# Patient Record
Sex: Female | Born: 1970
Health system: Southern US, Community
[De-identification: ages and names within clinical notes are randomized; demographics above are authoritative.]

## PROBLEM LIST (undated history)

## (undated) DIAGNOSIS — G4730 Sleep apnea, unspecified: Secondary | ICD-10-CM

## (undated) DIAGNOSIS — I10 Essential (primary) hypertension: Secondary | ICD-10-CM

## (undated) DIAGNOSIS — D649 Anemia, unspecified: Secondary | ICD-10-CM

## (undated) DIAGNOSIS — D219 Benign neoplasm of connective and other soft tissue, unspecified: Secondary | ICD-10-CM

## (undated) DIAGNOSIS — E7800 Pure hypercholesterolemia, unspecified: Secondary | ICD-10-CM

## (undated) HISTORY — DX: Pure hypercholesterolemia, unspecified: E78.00

## (undated) HISTORY — DX: Benign neoplasm of connective and other soft tissue, unspecified: D21.9

## (undated) HISTORY — DX: Essential (primary) hypertension: I10

## (undated) HISTORY — PX: BREAST SURGERY: SHX581

## (undated) HISTORY — PX: TUBAL LIGATION: SHX77

---

## 2015-09-21 LAB — BASIC METABOLIC PANEL
BUN: 7 mg/dL (ref 4–21)
CREATININE: 0.9 mg/dL (ref 0.5–1.1)
Glucose: 83 mg/dL
POTASSIUM: 4.4 mmol/L (ref 3.4–5.3)
Sodium: 143 mmol/L (ref 137–147)

## 2015-09-21 LAB — HEPATIC FUNCTION PANEL
ALT: 11 U/L (ref 7–35)
AST: 14 U/L (ref 13–35)
Bilirubin, Total: 0.5 mg/dL

## 2015-10-07 LAB — HM MAMMOGRAPHY

## 2015-10-13 LAB — HM PAP SMEAR: HM Pap smear: ABNORMAL

## 2015-10-14 LAB — HM MAMMOGRAPHY: HM MAMMO: ABNORMAL — AB (ref 0–4)

## 2016-04-26 ENCOUNTER — Ambulatory Visit (INDEPENDENT_AMBULATORY_CARE_PROVIDER_SITE_OTHER): Payer: PRIVATE HEALTH INSURANCE | Admitting: Internal Medicine

## 2016-04-26 ENCOUNTER — Other Ambulatory Visit (INDEPENDENT_AMBULATORY_CARE_PROVIDER_SITE_OTHER): Payer: PRIVATE HEALTH INSURANCE

## 2016-04-26 VITALS — BP 150/90 | HR 64 | Temp 98.6°F | Resp 12 | Ht 66.5 in | Wt 160.0 lb

## 2016-04-26 DIAGNOSIS — E785 Hyperlipidemia, unspecified: Secondary | ICD-10-CM

## 2016-04-26 DIAGNOSIS — F5101 Primary insomnia: Secondary | ICD-10-CM

## 2016-04-26 DIAGNOSIS — Z720 Tobacco use: Secondary | ICD-10-CM | POA: Diagnosis not present

## 2016-04-26 DIAGNOSIS — I10 Essential (primary) hypertension: Secondary | ICD-10-CM

## 2016-04-26 DIAGNOSIS — R87619 Unspecified abnormal cytological findings in specimens from cervix uteri: Secondary | ICD-10-CM

## 2016-04-26 DIAGNOSIS — J301 Allergic rhinitis due to pollen: Secondary | ICD-10-CM

## 2016-04-26 LAB — COMPREHENSIVE METABOLIC PANEL
ALBUMIN: 4.2 g/dL (ref 3.5–5.2)
ALT: 7 U/L (ref 0–35)
AST: 13 U/L (ref 0–37)
Alkaline Phosphatase: 54 U/L (ref 39–117)
BILIRUBIN TOTAL: 0.6 mg/dL (ref 0.2–1.2)
BUN: 9 mg/dL (ref 6–23)
CALCIUM: 9.5 mg/dL (ref 8.4–10.5)
CHLORIDE: 106 meq/L (ref 96–112)
CO2: 27 meq/L (ref 19–32)
CREATININE: 0.9 mg/dL (ref 0.40–1.20)
GFR: 71.45 mL/min (ref >60.00)
Glucose, Bld: 88 mg/dL (ref 70–99)
Potassium: 4.3 mEq/L (ref 3.5–5.1)
SODIUM: 139 meq/L (ref 135–145)
Total Protein: 7.8 g/dL (ref 6.0–8.3)

## 2016-04-26 LAB — URINALYSIS, ROUTINE W REFLEX MICROSCOPIC
Bilirubin Urine: NEGATIVE
Ketones, ur: NEGATIVE
Leukocytes, UA: NEGATIVE
Nitrite: NEGATIVE
PH: 5.5 (ref 5.0–8.0)
SPECIFIC GRAVITY, URINE: 1.02 (ref 1.000–1.030)
TOTAL PROTEIN, URINE-UPE24: NEGATIVE
URINE GLUCOSE: NEGATIVE
UROBILINOGEN UA: 0.2 (ref 0.0–1.0)

## 2016-04-26 LAB — LIPID PANEL
CHOL/HDL RATIO: 4
CHOLESTEROL: 186 mg/dL (ref 0–200)
HDL: 41.7 mg/dL (ref >39.00)
LDL Cholesterol: 130 mg/dL — ABNORMAL HIGH (ref 0–99)
NonHDL: 143.82
TRIGLYCERIDES: 71 mg/dL (ref 0.0–149.0)
VLDL: 14.2 mg/dL (ref 0.0–40.0)

## 2016-04-26 LAB — CBC WITH DIFFERENTIAL/PLATELET
BASOS ABS: 0 10*3/uL (ref 0.0–0.1)
BASOS PCT: 0.7 % (ref 0.0–3.0)
EOS ABS: 0.1 10*3/uL (ref 0.0–0.7)
Eosinophils Relative: 2 % (ref 0.0–5.0)
HEMATOCRIT: 35.8 % — AB (ref 36.0–46.0)
Hemoglobin: 11.9 g/dL — ABNORMAL LOW (ref 12.0–15.0)
LYMPHS PCT: 44.4 % (ref 12.0–46.0)
Lymphs Abs: 2.6 10*3/uL (ref 0.7–4.0)
MCHC: 33.3 g/dL (ref 30.0–36.0)
MCV: 87.6 fl (ref 78.0–100.0)
MONO ABS: 0.5 10*3/uL (ref 0.1–1.0)
Monocytes Relative: 8.2 % (ref 3.0–12.0)
NEUTROS ABS: 2.6 10*3/uL (ref 1.4–7.7)
NEUTROS PCT: 44.7 % (ref 43.0–77.0)
PLATELETS: 296 10*3/uL (ref 150.0–400.0)
RBC: 4.09 Mil/uL (ref 3.87–5.11)
RDW: 16.4 % — AB (ref 11.5–15.5)
WBC: 5.9 10*3/uL (ref 4.0–10.5)

## 2016-04-26 LAB — TSH: TSH: 0.88 u[IU]/mL (ref 0.35–4.50)

## 2016-04-26 MED ORDER — AZELASTINE-FLUTICASONE 137-50 MCG/ACT NA SUSP: 2.0000 | Freq: Two times a day (BID) | NASAL | 11 refills | Status: DC

## 2016-04-26 MED ORDER — CHLORTHALIDONE 25 MG PO TABS: 25.0000 mg | ORAL_TABLET | Freq: Every day | ORAL | 1 refills | Status: DC

## 2016-04-26 MED ORDER — VARENICLINE TARTRATE 0.5 MG X 11 & 1 MG X 42 PO MISC: ORAL | 0 refills | Status: DC

## 2016-04-26 MED ORDER — VARENICLINE TARTRATE 1 MG PO TABS: 1.0000 mg | ORAL_TABLET | Freq: Two times a day (BID) | ORAL | 3 refills | Status: DC

## 2016-04-26 NOTE — Patient Instructions (Signed)
Hypertension Hypertension, commonly called high blood pressure, is when the force of blood pumping through your arteries is too strong. Your arteries are the blood vessels that carry blood from your heart throughout your body. A blood pressure reading consists of a higher number over a lower number, such as 110/72. The higher number (systolic) is the pressure inside your arteries when your heart pumps. The lower number (diastolic) is the pressure inside your arteries when your heart relaxes. Ideally you want your blood pressure below 120/80. Hypertension forces your heart to work harder to pump blood. Your arteries may become narrow or stiff. Having untreated or uncontrolled hypertension can cause heart attack, stroke, kidney disease, and other problems. RISK FACTORS Some risk factors for high blood pressure are controllable. Others are not.  Risk factors you cannot control include:   Race. You may be at higher risk if you are African American.  Age. Risk increases with age.  Gender. Men are at higher risk than women before age 45 years. After age 65, women are at higher risk than men. Risk factors you can control include:  Not getting enough exercise or physical activity.  Being overweight.  Getting too much fat, sugar, calories, or salt in your diet.  Drinking too much alcohol. SIGNS AND SYMPTOMS Hypertension does not usually cause signs or symptoms. Extremely high blood pressure (hypertensive crisis) may cause headache, anxiety, shortness of breath, and nosebleed. DIAGNOSIS To check if you have hypertension, your health care provider will measure your blood pressure while you are seated, with your arm held at the level of your heart. It should be measured at least twice using the same arm. Certain conditions can cause a difference in blood pressure between your right and left arms. A blood pressure reading that is higher than normal on one occasion does not mean that you need treatment. If  it is not clear whether you have high blood pressure, you may be asked to return on a different day to have your blood pressure checked again. Or, you may be asked to monitor your blood pressure at home for 1 or more weeks. TREATMENT Treating high blood pressure includes making lifestyle changes and possibly taking medicine. Living a healthy lifestyle can help lower high blood pressure. You may need to change some of your habits. Lifestyle changes may include:  Following the DASH diet. This diet is high in fruits, vegetables, and whole grains. It is low in salt, red meat, and added sugars.  Keep your sodium intake below 2,300 mg per day.  Getting at least 30-45 minutes of aerobic exercise at least 4 times per week.  Losing weight if necessary.  Not smoking.  Limiting alcoholic beverages.  Learning ways to reduce stress. Your health care provider may prescribe medicine if lifestyle changes are not enough to get your blood pressure under control, and if one of the following is true:  You are 18-59 years of age and your systolic blood pressure is above 140.  You are 60 years of age or older, and your systolic blood pressure is above 150.  Your diastolic blood pressure is above 90.  You have diabetes, and your systolic blood pressure is over 140 or your diastolic blood pressure is over 90.  You have kidney disease and your blood pressure is above 140/90.  You have heart disease and your blood pressure is above 140/90. Your personal target blood pressure may vary depending on your medical conditions, your age, and other factors. HOME CARE INSTRUCTIONS    Have your blood pressure rechecked as directed by your health care provider.   Take medicines only as directed by your health care provider. Follow the directions carefully. Blood pressure medicines must be taken as prescribed. The medicine does not work as well when you skip doses. Skipping doses also puts you at risk for  problems.  Do not smoke.   Monitor your blood pressure at home as directed by your health care provider. SEEK MEDICAL CARE IF:   You think you are having a reaction to medicines taken.  You have recurrent headaches or feel dizzy.  You have swelling in your ankles.  You have trouble with your vision. SEEK IMMEDIATE MEDICAL CARE IF:  You develop a severe headache or confusion.  You have unusual weakness, numbness, or feel faint.  You have severe chest or abdominal pain.  You vomit repeatedly.  You have trouble breathing. MAKE SURE YOU:   Understand these instructions.  Will watch your condition.  Will get help right away if you are not doing well or get worse.   This information is not intended to replace advice given to you by your health care provider. Make sure you discuss any questions you have with your health care provider.   Document Released: 06/18/2005 Document Revised: 11/02/2014 Document Reviewed: 04/10/2013 Elsevier Interactive Patient Education 2016 [employer]  

## 2016-04-26 NOTE — Progress Notes (Signed)
Pre visit review using our clinic review tool, if applicable. No additional management support is needed unless otherwise documented below in the visit note. 

## 2016-04-26 NOTE — Progress Notes (Signed)
Subjective:  Patient ID: Samantha Reed, female    DOB: 1970/12/16  Age: 45 y.o. MRN: [id number]  CC: Hypertension; Hyperlipidemia; and Allergic Rhinitis   NEW TO ME  HPI Tatym Satterwhite presents for a BP check - She has a history of high blood pressure but ran out of her diuretic a few weeks ago so she was she has been taking it sparingly. She does complain that she's had a few headaches and tells me about a week ago her blood pressure was about 190/100. She denies blurred vision, chest pain, shortness of breath, palpitations, edema, fatigue.  She wants to quit smoking.  She also complains of worsening allergies with postnasal drip, nasal congestion, and runny nose.  No outpatient prescriptions prior to visit.   No facility-administered medications prior to visit.     ROS Review of Systems  Constitutional: Negative.  Negative for activity change, appetite change, chills, fatigue, fever and unexpected weight change.  HENT: Positive for congestion, postnasal drip and rhinorrhea. Negative for nosebleeds, sinus pressure, sneezing, sore throat, tinnitus and trouble swallowing.   Eyes: Negative.  Negative for visual disturbance.  Respiratory: Negative.  Negative for apnea, cough, choking, chest tightness, shortness of breath and stridor.   Cardiovascular: Negative.  Negative for chest pain, palpitations and leg swelling.  Gastrointestinal: Negative.  Negative for abdominal pain, constipation, diarrhea and nausea.  Endocrine: Negative.   Genitourinary: Negative.  Negative for difficulty urinating, dysuria, frequency, hematuria and urgency.  Musculoskeletal: Negative.  Negative for arthralgias, back pain, myalgias and neck pain.  Skin: Negative.   Allergic/Immunologic: Negative.   Neurological: Positive for headaches. Negative for dizziness, tremors, syncope, weakness, light-headedness and numbness.  Hematological: Negative.  Negative for adenopathy. Does not bruise/bleed easily.    Psychiatric/Behavioral: Negative.  Negative for sleep disturbance. The patient is not nervous/anxious.     Objective:  BP (!) 150/90 (BP Location: Left Arm, Patient Position: Sitting, Cuff Size: Normal)   Pulse 64   Temp 98.6 F (37 C) (Oral)   Resp 12   Ht 5' 6.5" (1.689 m)   Wt 160 lb (72.6 kg)   LMP 04/01/2016 (Exact Date) Comment: s/p BTL  SpO2 98%   BMI 25.44 kg/m   BP Readings from Last 3 Encounters:  04/26/16 (!) 150/90    Wt Readings from Last 3 Encounters:  04/26/16 160 lb (72.6 kg)    Physical Exam  Constitutional: She is oriented to person, place, and time. She appears well-developed and well-nourished. No distress.  HENT:  Head: Normocephalic and atraumatic.  Mouth/Throat: Oropharynx is clear and moist. No oropharyngeal exudate.  Eyes: Conjunctivae are normal. Right eye exhibits no discharge. Left eye exhibits no discharge. No scleral icterus.  Neck: Normal range of motion. Neck supple. No JVD present. No tracheal deviation present. No thyromegaly present.  Cardiovascular: Normal rate, regular rhythm, normal heart sounds and intact distal pulses.  Exam reveals no gallop and no friction rub.   No murmur heard. Pulmonary/Chest: Effort normal and breath sounds normal. No stridor. No respiratory distress. She has no wheezes. She has no rales. She exhibits no tenderness.  Abdominal: Soft. Bowel sounds are normal. She exhibits no distension and no mass. There is no tenderness. There is no rebound and no guarding.  Musculoskeletal: Normal range of motion. She exhibits no edema, tenderness or deformity.  Lymphadenopathy:    She has no cervical adenopathy.  Neurological: She is oriented to person, place, and time.  Skin: Skin is warm and dry. No rash noted. She  is not diaphoretic. No erythema. No pallor.  Psychiatric: She has a normal mood and affect. Her behavior is normal. Judgment and thought content normal.  Vitals reviewed.   Lab Results  Component Value Date    WBC 5.9 04/26/2016   HGB 11.9 (L) 04/26/2016   HCT 35.8 (L) 04/26/2016   PLT 296.0 04/26/2016   GLUCOSE 88 04/26/2016   CHOL 186 04/26/2016   TRIG 71.0 04/26/2016   HDL 41.70 04/26/2016   LDLCALC 130 (H) 04/26/2016   ALT 7 04/26/2016   AST 13 04/26/2016   NA 139 04/26/2016   K 4.3 04/26/2016   CL 106 04/26/2016   CREATININE 0.90 04/26/2016   BUN 9 04/26/2016   CO2 27 04/26/2016   TSH 0.88 04/26/2016    Patient was never admitted.  Assessment & Plan:   Robbyn was seen today for hypertension, hyperlipidemia and allergic rhinitis .  Diagnoses and all orders for this visit:  Essential hypertension, benign- Her blood pressure is not adequately well controlled and she has had a few headaches recently, her labs are negative for any evidence of secondary causes or end organ damage, I have asked her to change to a more potent thiazide diuretic. -     chlorthalidone (HYGROTON) 25 MG tablet; Take 1 tablet (25 mg total) by mouth daily. -     Comprehensive metabolic panel; Future -     CBC with Differential/Platelet; Future -     TSH; Future -     Urinalysis, Routine w reflex microscopic (not at [facility]); Future  Hyperlipidemia LDL goal <130- her Framingham risk score is very slightly elevated at 8% but she tells me that she will quit smoking soon at which time the Framingham risk score will go down quite a bit so at this time I do not recommend that she restart a statin. However, if in the long run she is not able to quit smoking then I may reconsider this. -     Lipid panel; Future -     TSH; Future  Primary insomnia  Tobacco abuse disorder- she will use Chantix in addition to behavior modification and nicotine patches. -     varenicline (CHANTIX CONTINUING MONTH PAK) 1 MG tablet; Take 1 tablet (1 mg total) by mouth 2 (two) times daily. -     varenicline (CHANTIX STARTING MONTH PAK) 0.5 MG X 11 & 1 MG X 42 tablet; Take one 0.5 mg tablet by mouth once daily for 3 days, then increase to  one 0.5 mg tablet twice daily for 4 days, then increase to one 1 mg tablet twice daily.  Acute seasonal allergic rhinitis due to pollen- she will start treating this with dye missed a period -     Azelastine-Fluticasone (DYMISTA) 137-50 MCG/ACT SUSP; Place 2 puffs into the nose 2 (two) times daily.  Abnormal cervical Papanicolaou smear, unspecified abnormal pap finding- she had an abnormal Pap smear in April of this year, I do not have any records and she does not know the specifics, she needs to set up with a gynecologist [place] made the referral. -     Ambulatory referral to Gynecology   I have discontinued Ms. Tall's hydrochlorothiazide. I am also having her start on chlorthalidone, varenicline, varenicline, and Azelastine-Fluticasone. Additionally, I am having her maintain her zolpidem and simvastatin.  Meds ordered this encounter  Medications  . zolpidem (AMBIEN) 10 MG tablet    Sig: Take 10 mg by mouth at bedtime as needed  for sleep.  . simvastatin (ZOCOR) 20 MG tablet    Sig: Take 20 mg by mouth daily.  Marland Kitchen DISCONTD: hydrochlorothiazide (HYDRODIURIL) 12.5 MG tablet    Sig: Take 12.5 mg by mouth daily.  . chlorthalidone (HYGROTON) 25 MG tablet    Sig: Take 1 tablet (25 mg total) by mouth daily.    Dispense:  90 tablet    Refill:  1  . varenicline (CHANTIX CONTINUING MONTH PAK) 1 MG tablet    Sig: Take 1 tablet (1 mg total) by mouth 2 (two) times daily.    Dispense:  60 tablet    Refill:  3  . varenicline (CHANTIX STARTING MONTH PAK) 0.5 MG X 11 & 1 MG X 42 tablet    Sig: Take one 0.5 mg tablet by mouth once daily for 3 days, then increase to one 0.5 mg tablet twice daily for 4 days, then increase to one 1 mg tablet twice daily.    Dispense:  53 tablet    Refill:  0  . Azelastine-Fluticasone (DYMISTA) 137-50 MCG/ACT SUSP    Sig: Place 2 puffs into the nose 2 (two) times daily.    Dispense:  23 g    Refill:  11     Follow-up: Return in about 3 months  (around 07/27/2016).  Scarlette Calico, MD

## 2016-04-27 ENCOUNTER — Encounter: Payer: Self-pay | Admitting: Internal Medicine

## 2016-04-30 ENCOUNTER — Encounter: Payer: Self-pay | Admitting: Internal Medicine

## 2016-05-23 ENCOUNTER — Telehealth: Payer: Self-pay | Admitting: Internal Medicine

## 2016-05-23 NOTE — Telephone Encounter (Signed)
Rec'd from Dr. Huntley Estelle forward 38 pages to Dr. Ronnald Ramp

## 2016-05-30 ENCOUNTER — Ambulatory Visit (INDEPENDENT_AMBULATORY_CARE_PROVIDER_SITE_OTHER): Payer: 59 | Admitting: Gynecology

## 2016-05-30 ENCOUNTER — Encounter: Payer: Self-pay | Admitting: Gynecology

## 2016-05-30 VITALS — BP 136/88 | Ht 66.5 in | Wt 159.8 lb

## 2016-05-30 DIAGNOSIS — Z113 Encounter for screening for infections with a predominantly sexual mode of transmission: Secondary | ICD-10-CM

## 2016-05-30 DIAGNOSIS — Z01411 Encounter for gynecological examination (general) (routine) with abnormal findings: Secondary | ICD-10-CM | POA: Diagnosis not present

## 2016-05-30 DIAGNOSIS — F17200 Nicotine dependence, unspecified, uncomplicated: Secondary | ICD-10-CM | POA: Diagnosis not present

## 2016-05-30 DIAGNOSIS — D259 Leiomyoma of uterus, unspecified: Secondary | ICD-10-CM | POA: Diagnosis not present

## 2016-05-30 DIAGNOSIS — N938 Other specified abnormal uterine and vaginal bleeding: Secondary | ICD-10-CM | POA: Diagnosis not present

## 2016-05-30 DIAGNOSIS — N946 Dysmenorrhea, unspecified: Secondary | ICD-10-CM | POA: Diagnosis not present

## 2016-05-30 LAB — HM PAP SMEAR

## 2016-05-30 NOTE — Addendum Note (Signed)
Addended by: Burnett Kanaris on: 05/30/2016 10:34 AM   Modules accepted: Orders

## 2016-05-30 NOTE — Progress Notes (Signed)
Samantha Reed 11/30/1970 [id number]   History:    45 y.o.  for annual gyn exam who is a new patient to the practice. Patient has moved from [place] and has stated that she has had history of fibroid uterus in the past as well as abnormal Pap smears but did not remember the specifics. She believes she had ultrasound and Pap smear within the past year but we have no records. Patient recently separated from her husband would like to have a full STD screen today although she is not reporting any vaginal discharge. Several years ago she did have a tubal ligation. She reports normal menstrual cycles with the exception that they're very heavy and sometimes with a lot of cramping but she has had in the past 2 cycles per month but reports a last 2 months her cycles of been regular. Patient with past lumpectomy benign does not remember which breast.  Past medical history,surgical history, family history and social history were all reviewed and documented in the EPIC chart.  Gynecologic History Patient's last menstrual period was 04/01/2016. Contraception: tubal ligation Last Pap: Over year ago. Results were: abnormal Last mammogram: 2017. Results were: We have no report  Obstetric History OB History  Gravida Para Term Preterm AB Living  5 2     3 2   SAB TAB Ectopic Multiple Live Births               # Outcome Date GA Lbr Len/2nd Weight Sex Delivery Anes PTL Lv  5 AB           4 AB           3 AB           2 Para           1 Para                ROS: A ROS was performed and pertinent positives and negatives are included in the history.  GENERAL: No fevers or chills. HEENT: No change in vision, no earache, sore throat or sinus congestion. NECK: No pain or stiffness. CARDIOVASCULAR: No chest pain or pressure. No palpitations. PULMONARY: No shortness of breath, cough or wheeze. GASTROINTESTINAL: No abdominal pain, nausea, vomiting or diarrhea, melena or bright red blood per rectum.  GENITOURINARY: No urinary frequency, urgency, hesitancy or dysuria. MUSCULOSKELETAL: No joint or muscle pain, no back pain, no recent trauma. DERMATOLOGIC: No rash, no itching, no lesions. ENDOCRINE: No polyuria, polydipsia, no heat or cold intolerance. No recent change in weight. HEMATOLOGICAL: No anemia or easy bruising or bleeding. NEUROLOGIC: No headache, seizures, numbness, tingling or weakness. PSYCHIATRIC: No depression, no loss of interest in normal activity or change in sleep pattern.     Exam: chaperone present  BP 136/88   Ht 5' 6.5" (1.689 m)   Wt 159 lb 12.8 oz (72.5 kg)   LMP 04/01/2016   BMI 25.41 kg/m   Body mass index is 25.41 kg/m.  General appearance : Well developed well nourished female. No acute distress HEENT: Eyes: no retinal hemorrhage or exudates,  Neck supple, trachea midline, no carotid bruits, no thyroidmegaly Lungs: Clear to auscultation, no rhonchi or wheezes, or rib retractions  Heart: Regular rate and rhythm, no murmurs or gallops Breast:Examined in sitting and supine position were symmetrical in appearance, no palpable masses or tenderness,  no skin retraction, no nipple inversion, no nipple discharge, no skin discoloration, no axillary or supraclavicular lymphadenopathy Abdomen: no palpable masses or tenderness,  no rebound or guarding Extremities: no edema or skin discoloration or tenderness  Pelvic:  Bartholin, Urethra, Skene Glands: Within normal limits             Vagina: No gross lesions or discharge  Cervix: No gross lesions or discharge  Uterus  12-14 week size irregular firm nontender  Adnexa difficult to evaluate due to size of uterus  Anus and perineum  normal   Rectovaginal  normal sphincter tone without palpated masses or tenderness             Hemoccult not indicated     Assessment/Plan:  45 y.o. female for annual exam with history of fibroid uterus we have no records on exam today uterus was irregular measuring 12-14 week size.  Patient will be scheduled for sonohysterogram in 2 weeks to rule out any intracavitary fibroids contributing to her past history of 2 menstrual cycles per month. We'll also determine at that point to do an endometrial biopsy. Patient is a smoker and recently started Chantix. She had been smoking 1 pack cigarette per day. The STD screening that was ordered today are as follows: GC and Chlamydia culture, HIV, RPR, hepatitis B and C as well as Pap smear with HPV screening. We'll request for her medical records to review and have available when she returns back in 2 weeks.   Terrance Mass MD, 9:28 AM 05/30/2016

## 2016-05-30 NOTE — Patient Instructions (Signed)

## 2016-05-30 NOTE — Addendum Note (Signed)
Addended by: Joaquin Music on: 05/30/2016 11:46 AM   Modules accepted: Orders

## 2016-05-31 ENCOUNTER — Other Ambulatory Visit: Payer: Self-pay | Admitting: Gynecology

## 2016-05-31 DIAGNOSIS — N939 Abnormal uterine and vaginal bleeding, unspecified: Secondary | ICD-10-CM

## 2016-05-31 LAB — RPR

## 2016-05-31 LAB — HIV ANTIBODY (ROUTINE TESTING W REFLEX): HIV: NONREACTIVE

## 2016-05-31 LAB — HEPATITIS C ANTIBODY: HCV AB: NEGATIVE

## 2016-05-31 LAB — HEPATITIS B SURFACE ANTIGEN: HEP B S AG: NEGATIVE

## 2016-05-31 NOTE — Addendum Note (Signed)
Addended by: Joaquin Music on: 05/31/2016 08:39 AM   Modules accepted: Orders

## 2016-06-01 ENCOUNTER — Telehealth: Payer: Self-pay | Admitting: *Deleted

## 2016-06-01 LAB — PAP IG, CT-NG NAA, HPV HIGH-RISK
Chlamydia Probe Amp: NOT DETECTED
GC Probe Amp: NOT DETECTED
HPV DNA HIGH RISK: NOT DETECTED

## 2016-06-01 NOTE — Telephone Encounter (Signed)
Per Vince at [facility] call ref [phone] [facility] covered at 80% after deductible of $1500 met. Only $390.27 met therefore pt responsible for $1168.29. Pt will be informed KW CMA

## 2016-06-07 NOTE — Telephone Encounter (Signed)
Contacted pt and informed that we have the OV notes and will send them to Dr. Toney Rakes and mail her a copy as well.

## 2016-06-07 NOTE — Telephone Encounter (Signed)
Pt called and is requesting a copy of her records.  Have you seen them?

## 2016-06-08 NOTE — Telephone Encounter (Signed)
Records mailed to pt, sent to scan, and faxed to Dr. Sandrea Hughs office.

## 2016-06-14 ENCOUNTER — Ambulatory Visit: Payer: 59 | Admitting: Gynecology

## 2016-06-14 ENCOUNTER — Other Ambulatory Visit: Payer: 59

## 2016-06-22 ENCOUNTER — Encounter: Payer: Self-pay | Admitting: Internal Medicine

## 2016-06-22 NOTE — Progress Notes (Signed)
Abstracted result and sent to scan  

## 2016-08-07 ENCOUNTER — Ambulatory Visit (INDEPENDENT_AMBULATORY_CARE_PROVIDER_SITE_OTHER): Payer: 59 | Admitting: Women's Health

## 2016-08-07 ENCOUNTER — Telehealth: Payer: Self-pay | Admitting: Internal Medicine

## 2016-08-07 ENCOUNTER — Encounter: Payer: Self-pay | Admitting: Women's Health

## 2016-08-07 VITALS — BP 124/80

## 2016-08-07 DIAGNOSIS — Z720 Tobacco use: Secondary | ICD-10-CM | POA: Diagnosis not present

## 2016-08-07 DIAGNOSIS — N760 Acute vaginitis: Secondary | ICD-10-CM | POA: Diagnosis not present

## 2016-08-07 DIAGNOSIS — B9689 Other specified bacterial agents as the cause of diseases classified elsewhere: Secondary | ICD-10-CM

## 2016-08-07 DIAGNOSIS — F5101 Primary insomnia: Secondary | ICD-10-CM | POA: Diagnosis not present

## 2016-08-07 DIAGNOSIS — N898 Other specified noninflammatory disorders of vagina: Secondary | ICD-10-CM

## 2016-08-07 LAB — WET PREP FOR TRICH, YEAST, CLUE
TRICH WET PREP: NONE SEEN
YEAST WET PREP: NONE SEEN

## 2016-08-07 MED ORDER — ZOLPIDEM TARTRATE 10 MG PO TABS: 10.0000 mg | ORAL_TABLET | Freq: Every evening | ORAL | 2 refills | Status: DC | PRN

## 2016-08-07 MED ORDER — METRONIDAZOLE 500 MG PO TABS: 500.0000 mg | ORAL_TABLET | Freq: Two times a day (BID) | ORAL | 0 refills | Status: DC

## 2016-08-07 NOTE — Telephone Encounter (Signed)
PLEASE NOTE: All timestamps contained within this report are represented as [place] Standard Time. CONFIDENTIALTY NOTICE: This fax transmission is intended only for the addressee. It contains information that is legally privileged, confidential or otherwise protected from use or disclosure. If you are not the intended recipient, you are strictly prohibited from reviewing, disclosing, copying using or disseminating any of this information or taking any action in reliance on or regarding this information. If you have received this fax in error, please notify us immediately by telephone so that we can arrange for its return to [place]. Phone: [phone], Toll-Free: [phone], Fax: [phone] Page: 1 of 1 Call Id: [id number] [place] [facility] - Client [place] Patient Name: Samantha Reed DOB: 10/06/1970 Initial Comment Caller says, she thinks she has a sinus infection, 3-4 days, cough makes her chest hurt. She wants to make an appt. Nurse Assessment Nurse: Andria Frames, RN, Aeriel Date/Time (Eastern Time): 08/07/2016 1:42:34 PM Confirm and document reason for call. If symptomatic, describe symptoms. ---Caller states, she thinks she has a sinus infection. She thinks the worse is gone. She has pain in her chest when she coughs. It is only when she is coughing. Caller states, no fever, does have a lot of drainage. Does the patient have any new or worsening symptoms? ---Yes Will a triage be completed? ---Yes Related visit to physician within the last 2 weeks? ---No Does the PT have any chronic conditions? (i.e. diabetes, asthma, etc.) ---Yes List chronic conditions. ---htn Is the patient pregnant or possibly pregnant? (Ask all females between the ages of 54-55) ---No Is this a behavioral health or substance abuse call? ---No Guidelines Guideline Title Affirmed Question Affirmed Notes Cough - Acute Productive ALSO, mild central chest  pain occurs only when coughing Final Disposition User See Physician within 24 Hours Hensel, RN, Aeriel Comments Nurse did attempt an appt. Only appt available is at 4 pm she has another appt at 3 and won't be able to be seen. Nurse unable to find any appt tomorrow within 24 hour time frame. Nurse did send telehealth advice as well. Caller will call back if she decides not to use telehealth. Nurse upgrade pt condition. Referrals GO TO FACILITY UNDECIDED Disagree/Comply: Comply

## 2016-08-07 NOTE — Progress Notes (Signed)
Presents with the complaint of a thick white discharge for the past week or so with mild irritation without itching or odor. Denies urinary symptoms, abdominal pain or fever. New partner, negative STD screen 05/2016 month reports using condoms consistently. Monthly heavy cycle/BTL. History of fibroids. Currently on Chantix trying to quit smoking. Requested refill of Ambien.   Exam: Appears well. External genitalia within normal limits, speculum exam copious white adherent discharge noted, wet prep positive for TNTC clue cells, TNTC bacteria. GC/Chlamydia culture taken. Bimanual uterus enlarged 12 week size nontender no adnexal  Tenderness. UA: Negative  Bacteria vaginosis Insomnia  Plan: Options reviewed, Flagyl 500 twice daily for 7 days, alcohol precautions reviewed. Instructed to call for no relief of discharge. GC/Chlamydia culture pending, declines need for HIV, hepatitis or RPR. Encouraged condoms until permanent partner. Insomnia, sleep hygiene reviewed, prescription for Ambien 10 mg at bedtime when necessary given. Will continue Chantix, smoking cessation reviewed.

## 2016-08-07 NOTE — Patient Instructions (Signed)
Bacterial Vaginosis Bacterial vaginosis is an infection of the vagina. It happens when too many germs (bacteria) grow in the vagina. This infection puts you at risk for infections from sex (STIs). Treating this infection can lower your risk for some STIs. You should also treat this if you are pregnant. It can cause your baby to be born early. Follow these instructions at home: Medicines  Take over-the-counter and prescription medicines only as told by your doctor.  Take or use your antibiotic medicine as told by your doctor. Do not stop taking or using it even if you start to feel better. General instructions  If you your sexual partner is a woman, tell her that you have this infection. She needs to get treatment if she has symptoms. If you have a female partner, he does not need to be treated.  During treatment:  Avoid sex.  Do not douche.  Avoid alcohol as told.  Avoid breastfeeding as told.  Drink enough fluid to keep your pee (urine) clear or pale yellow.  Keep your vagina and butt (rectum) clean.  Wash the area with warm water every day.  Wipe from front to back after you use the toilet.  Keep all follow-up visits as told by your doctor. This is important. Preventing this condition  Do not douche.  Use only warm water to wash around your vagina.  Use protection when you have sex. This includes:  Latex condoms.  Dental dams.  Limit how many people you have sex with. It is best to only have sex with the same person (be monogamous).  Get tested for STIs. Have your partner get tested.  Wear underwear that is cotton or lined with cotton.  Avoid tight pants and pantyhose. This is most important in summer.  Do not use any products that have nicotine or tobacco in them. These include cigarettes and e-cigarettes. If you need help quitting, ask your doctor.  Do not use illegal drugs.  Limit how much alcohol you drink. Contact a doctor if:  Your symptoms do not get  better, even after you are treated.  You have more discharge or pain when you pee (urinate).  You have a fever.  You have pain in your belly (abdomen).  You have pain with sex.  Your bleed from your vagina between periods. Summary  This infection happens when too many germs (bacteria) grow in the vagina.  Treating this condition can lower your risk for some infections from sex (STIs).  You should also treat this if you are pregnant. It can cause early (premature) birth.  Do not stop taking or using your antibiotic medicine even if you start to feel better. This information is not intended to replace advice given to you by your health care provider. Make sure you discuss any questions you have with your health care provider. Document Released: 03/27/2008 Document Revised: 03/03/2016 Document Reviewed: 03/03/2016 Elsevier Interactive Patient Education  2017 [place]. Insomnia Insomnia is a sleep disorder that makes it difficult to fall asleep or to stay asleep. Insomnia can cause tiredness (fatigue), low energy, difficulty concentrating, mood swings, and poor performance at work or school. There are three different ways to classify insomnia:  Difficulty falling asleep.  Difficulty staying asleep.  Waking up too early in the morning. Any type of insomnia can be long-term (chronic) or short-term (acute). Both are common. Short-term insomnia usually lasts for three months or less. Chronic insomnia occurs at least three times a week for longer than three  months. What are the causes? Insomnia may be caused by another condition, situation, or substance, such as:  Anxiety.  Certain medicines.  Gastroesophageal reflux disease (GERD) or other gastrointestinal conditions.  Asthma or other breathing conditions.  Restless legs syndrome, sleep apnea, or other sleep disorders.  Chronic pain.  Menopause. This may include hot flashes.  Stroke.  Abuse of alcohol, tobacco, or  illegal drugs.  Depression.  Caffeine.  Neurological disorders, such as Alzheimer disease.  An overactive thyroid (hyperthyroidism). The cause of insomnia may not be known. What increases the risk? Risk factors for insomnia include:  Gender. Women are more commonly affected than men.  Age. Insomnia is more common as you get older.  Stress. This may involve your professional or personal life.  Income. Insomnia is more common in people with lower income.  Lack of exercise.  Irregular work schedule or night shifts.  Traveling between different time zones. What are the signs or symptoms? If you have insomnia, trouble falling asleep or trouble staying asleep is the main symptom. This may lead to other symptoms, such as:  Feeling fatigued.  Feeling nervous about going to sleep.  Not feeling rested in the morning.  Having trouble concentrating.  Feeling irritable, anxious, or depressed. How is this treated? Treatment for insomnia depends on the cause. If your insomnia is caused by an underlying condition, treatment will focus on addressing the condition. Treatment may also include:  Medicines to help you sleep.  Counseling or therapy.  Lifestyle adjustments. Follow these instructions at home:  Take medicines only as directed by your health care provider.  Keep regular sleeping and waking hours. Avoid naps.  Keep a sleep diary to help you and your health care provider figure out what could be causing your insomnia. Include:  When you sleep.  When you wake up during the night.  How well you sleep.  How rested you feel the next day.  Any side effects of medicines you are taking.  What you eat and drink.  Make your bedroom a comfortable place where it is easy to fall asleep:  Put up shades or special blackout curtains to block light from outside.  Use a white noise machine to block noise.  Keep the temperature cool.  Exercise regularly as directed by  your health care provider. Avoid exercising right before bedtime.  Use relaxation techniques to manage stress. Ask your health care provider to suggest some techniques that may work well for you. These may include:  Breathing exercises.  Routines to release muscle tension.  Visualizing peaceful scenes.  Cut back on alcohol, caffeinated beverages, and cigarettes, especially close to bedtime. These can disrupt your sleep.  Do not overeat or eat spicy foods right before bedtime. This can lead to digestive discomfort that can make it hard for you to sleep.  Limit screen use before bedtime. This includes:  Watching TV.  Using your smartphone, tablet, and computer.  Stick to a routine. This can help you fall asleep faster. Try to do a quiet activity, brush your teeth, and go to bed at the same time each night.  Get out of bed if you are still awake after 15 minutes of trying to sleep. Keep the lights down, but try reading or doing a quiet activity. When you feel sleepy, go back to bed.  Make sure that you drive carefully. Avoid driving if you feel very sleepy.  Keep all follow-up appointments as directed by your health care provider. This is important. Contact  a health care provider if:  You are tired throughout the day or have trouble in your daily routine due to sleepiness.  You continue to have sleep problems or your sleep problems get worse. Get help right away if:  You have serious thoughts about hurting yourself or someone else. This information is not intended to replace advice given to you by your health care provider. Make sure you discuss any questions you have with your health care provider. Document Released: 06/15/2000 Document Revised: 11/18/2015 Document Reviewed: 03/19/2014 Elsevier Interactive Patient Education  2017 [employer].

## 2016-08-08 ENCOUNTER — Telehealth: Payer: Self-pay | Admitting: Internal Medicine

## 2016-08-08 ENCOUNTER — Encounter: Payer: Self-pay | Admitting: Adult Health

## 2016-08-08 ENCOUNTER — Ambulatory Visit (INDEPENDENT_AMBULATORY_CARE_PROVIDER_SITE_OTHER): Payer: 59 | Admitting: Adult Health

## 2016-08-08 VITALS — BP 112/64 | Temp 98.2°F | Ht 66.5 in | Wt 158.4 lb

## 2016-08-08 DIAGNOSIS — J40 Bronchitis, not specified as acute or chronic: Secondary | ICD-10-CM

## 2016-08-08 LAB — GC/CHLAMYDIA PROBE AMP
CT PROBE, AMP APTIMA: NOT DETECTED
GC Probe RNA: NOT DETECTED

## 2016-08-08 MED ORDER — PREDNISONE 10 MG PO TABS: ORAL_TABLET | ORAL | 0 refills | Status: DC

## 2016-08-08 NOTE — Telephone Encounter (Signed)
Is this ok?

## 2016-08-08 NOTE — Telephone Encounter (Signed)
It is ok with me, if it is ok with Dr. Ronnald Ramp

## 2016-08-08 NOTE — Telephone Encounter (Signed)
Pt want to know will you take her ask a transfer pt from dr. Ronnald Ramp

## 2016-08-08 NOTE — Progress Notes (Signed)
Subjective:    Patient ID: Samantha Reed, female    DOB: 12/19/1970, 46 y.o.   MRN: [id number]  Cough  This is a new problem. The current episode started in the past 7 days. The problem has been gradually improving. The cough is non-productive. Associated symptoms include postnasal drip (resolved), rhinorrhea and shortness of breath. Pertinent negatives include no chills, ear pain, fever, headaches, nasal congestion or wheezing. Risk factors for lung disease include smoking/tobacco exposure. The treatment provided no relief. There is no history of asthma, bronchitis, COPD or pneumonia.    Review of Systems  Constitutional: Negative for chills and fever.  HENT: Positive for postnasal drip (resolved) and rhinorrhea. Negative for ear pain.   Respiratory: Positive for cough and shortness of breath. Negative for wheezing.   Neurological: Negative for headaches.   Past Medical History:  Diagnosis Date  . Fibroid   . High cholesterol   . Hypertension     Social History   Social History  . Marital status: Legally Separated    Spouse name: N/A  . Number of children: N/A  . Years of education: N/A   Occupational History  . Not on file.   Social History Main Topics  . Smoking status: Current Every Day Smoker    Packs/day: 1.00    Years: 20.00    Types: Cigarettes  . Smokeless tobacco: Never Used  . Alcohol use 1.2 oz/week    2 Glasses of wine per week  . Drug use: No  . Sexual activity: Not Currently   Other Topics Concern  . Not on file   Social History Narrative  . No narrative on file    Past Surgical History:  Procedure Laterality Date  . BREAST SURGERY     LUMPECTOMY FROM BREAST ? WHICH BREAST   . TUBAL LIGATION      Family History  Problem Relation Age of Onset  . Heart disease Mother   . Hypertension Mother   . Alcohol abuse Father   . Early death Maternal Grandmother   . Hearing loss Maternal Grandmother   . Hyperlipidemia Maternal Grandmother   .  Kidney disease Maternal Grandmother   . Stroke Maternal Grandmother     No Known Allergies  Current Outpatient Prescriptions on File Prior to Visit  Medication Sig Dispense Refill  . Azelastine-Fluticasone (DYMISTA) 137-50 MCG/ACT SUSP Place 2 puffs into the nose 2 (two) times daily. 23 g 11  . chlorthalidone (HYGROTON) 25 MG tablet Take 1 tablet (25 mg total) by mouth daily. 90 tablet 1  . metroNIDAZOLE (FLAGYL) 500 MG tablet Take 1 tablet (500 mg total) by mouth 2 (two) times daily. 14 tablet 0  . simvastatin (ZOCOR) 20 MG tablet Take 20 mg by mouth daily.    . varenicline (CHANTIX CONTINUING MONTH PAK) 1 MG tablet Take 1 tablet (1 mg total) by mouth 2 (two) times daily. 60 tablet 3  . varenicline (CHANTIX STARTING MONTH PAK) 0.5 MG X 11 & 1 MG X 42 tablet Take one 0.5 mg tablet by mouth once daily for 3 days, then increase to one 0.5 mg tablet twice daily for 4 days, then increase to one 1 mg tablet twice daily. 53 tablet 0  . zolpidem (AMBIEN) 10 MG tablet Take 1 tablet (10 mg total) by mouth at bedtime as needed for sleep. 30 tablet 2   No current facility-administered medications on file prior to visit.     BP 112/64   Temp 98.2 F (36.8 C) (  Oral)   Ht 5' 6.5" (1.689 m)   Wt 158 lb 6.4 oz (71.8 kg)   LMP 07/30/2016   BMI 25.18 kg/m       Objective:   Physical Exam  Constitutional: She is oriented to person, place, and time. She appears well-developed and well-nourished. No distress.  Cardiovascular: Normal rate, regular rhythm, normal heart sounds and intact distal pulses.  Exam reveals no gallop and no friction rub.   No murmur heard. Pulmonary/Chest: Effort normal. No respiratory distress. She has wheezes (trace throughout ). She has no rales. She exhibits no tenderness.  Neurological: She is alert and oriented to person, place, and time.  Skin: Skin is warm and dry. No rash noted. She is not diaphoretic. No erythema. No pallor.  Psychiatric: She has a normal mood and  affect. Her behavior is normal. Judgment and thought content normal.  Nursing note and vitals reviewed.      Assessment & Plan:  1. Bronchitis - predniSONE (DELTASONE) 10 MG tablet; 40 mg x 3 days, 20 mg x 3 days, 10 mg x 3 days  Dispense: 21 tablet; Refill: 0 - Add Mucinex - Stop smoking  - Follow up as needed Dorothyann Peng, NP

## 2016-08-08 NOTE — Telephone Encounter (Signed)
Yes

## 2016-08-08 NOTE — Patient Instructions (Addendum)

## 2016-08-13 ENCOUNTER — Telehealth: Payer: Self-pay | Admitting: *Deleted

## 2016-08-13 MED ORDER — FLUCONAZOLE 150 MG PO TABS: 150.0000 mg | ORAL_TABLET | Freq: Every day | ORAL | 0 refills | Status: DC

## 2016-08-13 NOTE — Telephone Encounter (Signed)
Pt was prescribed flagy 500 mg on OV 08/07/16, asked if diflucan tablet could be sent to pharmacy, typically has yeast infections after taking antibiotic. Please advise

## 2016-08-13 NOTE — Telephone Encounter (Signed)
Pt informed, Rx sent. 

## 2016-08-13 NOTE — Telephone Encounter (Signed)
Ok for diflucan 150 mg ,

## 2016-08-21 NOTE — Telephone Encounter (Signed)
LMOM to call  back to make a appt.

## 2016-10-12 ENCOUNTER — Ambulatory Visit (INDEPENDENT_AMBULATORY_CARE_PROVIDER_SITE_OTHER): Payer: 59 | Admitting: Gynecology

## 2016-10-12 ENCOUNTER — Encounter: Payer: Self-pay | Admitting: Gynecology

## 2016-10-12 VITALS — BP 122/70 | Ht 66.5 in | Wt 163.0 lb

## 2016-10-12 DIAGNOSIS — N938 Other specified abnormal uterine and vaginal bleeding: Secondary | ICD-10-CM | POA: Diagnosis not present

## 2016-10-12 DIAGNOSIS — R1900 Intra-abdominal and pelvic swelling, mass and lump, unspecified site: Secondary | ICD-10-CM

## 2016-10-12 LAB — CBC WITH DIFFERENTIAL/PLATELET
BASOS PCT: 1 %
Basophils Absolute: 66 cells/uL (ref 0–200)
EOS PCT: 3 %
Eosinophils Absolute: 198 cells/uL (ref 15–500)
HCT: 36.9 % (ref 35.0–45.0)
Hemoglobin: 12 g/dL (ref 11.7–15.5)
LYMPHS PCT: 49 %
Lymphs Abs: 3234 cells/uL (ref 850–3900)
MCH: 29.3 pg (ref 27.0–33.0)
MCHC: 32.5 g/dL (ref 32.0–36.0)
MCV: 90.2 fL (ref 80.0–100.0)
MONOS PCT: 8 %
MPV: 9.2 fL (ref 7.5–12.5)
Monocytes Absolute: 528 cells/uL (ref 200–950)
NEUTROS PCT: 39 %
Neutro Abs: 2574 cells/uL (ref 1500–7800)
PLATELETS: 414 10*3/uL — AB (ref 140–400)
RBC: 4.09 MIL/uL (ref 3.80–5.10)
RDW: 15.7 % — AB (ref 11.0–15.0)
WBC: 6.6 10*3/uL (ref 3.8–10.8)

## 2016-10-12 MED ORDER — MEGESTROL ACETATE 40 MG PO TABS: 40.0000 mg | ORAL_TABLET | Freq: Two times a day (BID) | ORAL | 1 refills | Status: DC

## 2016-10-12 NOTE — Patient Instructions (Addendum)
ENDOMETRIAL BIOPSY POST-PROCEDURE INSTRUCTIONS  1. You may take Ibuprofen, Aleve or Tylenol for pain if needed.  Cramping should resolve within in 24 hours.  2. You may have a small amount of spotting.  You should wear a mini pad for the next few days.  3. You may have intercourse after 24 hours.  4. You need to call if you have any pelvic pain, fever, heavy bleeding or foul smelling vaginal discharge.  5. Shower or bathe as normal  6. We will call you within one week with results or we will discuss   the results at your follow-up appointment if needed.  Hysterectomy Information A hysterectomy is a surgery to remove your uterus. After surgery, you will no longer have periods. Also, you will not be able to get pregnant. Reasons for this surgery  You have bleeding that is not normal and keeps coming back.  You have lasting (chronic) lower belly (pelvic) pain.  You have a lasting infection.  The lining of your uterus grows outside your uterus.  The lining of your uterus grows in the muscle of your uterus.  Your uterus falls down into your vagina.  You have a growth in your uterus that causes problems.  You have cells that could turn into cancer (precancerous cells).  You have cancer of the uterus or cervix. Types There are 3 types of hysterectomies. Depending on the type, the surgery will:  Remove the top part of the uterus only.  Remove the uterus and the cervix.  Remove the uterus, cervix, and tissue that holds the uterus in place in the lower belly. Ways a hysterectomy can be performed There are 5 ways this surgery can be performed.  A cut (incision) is made in the belly (abdomen). The uterus is taken out through the cut.  A cut is made in the vagina. The uterus is taken out through the cut.  Three or four cuts are made in the belly. A surgical device with a camera is put through one of the cuts. The uterus is cut into small pieces. The uterus is taken out through  the cuts or the vagina.  Three or four cuts are made in the belly. A surgical device with a camera is put through one of the cuts. The uterus is taken out through the vagina.  Three or four cuts are made in the belly. A surgical device that is controlled by a computer makes a visual image. The device helps the surgeon control the surgical tools. The uterus is cut into small pieces. The pieces are taken out through the cuts or through the vagina. What can I expect after the surgery?  You will be given pain medicine.  You will need help at home for 3-5 days after surgery.  You will need to see your doctor in 2-4 weeks after surgery.  You may get hot flashes, have night sweats, and have trouble sleeping.  You may need to have Pap tests in the future if your surgery was related to cancer. Talk to your doctor. It is still good to have regular exams. This information is not intended to replace advice given to you by your health care provider. Make sure you discuss any questions you have with your health care provider. Document Released: 09/10/2011 Document Revised: 11/24/2015 Document Reviewed: 02/23/2013 Elsevier Interactive Patient Education  2017 [employer].

## 2016-10-12 NOTE — Progress Notes (Signed)
   Patient is a 46 year old that presented to the office complaining of bleeding over the course the past 2 weeks patient with known history of fibroid uterus was seen last year's a new patient she had moved from [place] had been informed at that time that she had a fibroid uterus we reviewed her report from [place] and they demonstrated on ultrasound a 12 week size uterus. When I saw her on November 2017 had recommended a sonohysterogram to see if there is any submucous myoma but but insurance coverage was not available so she'll today with similar symptoms. She's had a previous tubal ligation in the past.  Exam: Gen. appearance well-developed well nourished female with above mentioned complaint Pelvic: Bartholin urethra Skene was within normal limits Vagina blood was present the vaginal vault Cervix: No lesions or leaf some blood at the external os Bimanual exam 14 week size irregular form uterus Adnexa difficult to evaluate due to large uterus.  Patient had a normal Pap smear in 2017 she was counseled for endometrial biopsy. The cervix was cleansed with Betadine solution and her uterus sounded to 10+ centimeters and moderate amount of tissue was obtained with a sterile Pipelle. Tissue submitted for histological evaluation  Assessment/plan: 46 year old patient with dysfunction bleeding fibroid uterus. Normal Pap smear in 2017. Endometrial biopsy done today pathology report pending. Patient will be placed on Megace 40 mg twice a day for 10 days. We'll check a CBC today she'll return back next week for an ultrasound to compare with last year done in [place]. I've given patient literature information for consideration of a total abdominal hysterectomy with bilateral salpingectomy in the near future.

## 2016-10-17 ENCOUNTER — Ambulatory Visit: Payer: 59

## 2016-10-17 ENCOUNTER — Ambulatory Visit (INDEPENDENT_AMBULATORY_CARE_PROVIDER_SITE_OTHER): Payer: 59 | Admitting: Women's Health

## 2016-10-17 ENCOUNTER — Ambulatory Visit (INDEPENDENT_AMBULATORY_CARE_PROVIDER_SITE_OTHER): Payer: 59

## 2016-10-17 ENCOUNTER — Other Ambulatory Visit: Payer: Self-pay | Admitting: Women's Health

## 2016-10-17 DIAGNOSIS — N852 Hypertrophy of uterus: Secondary | ICD-10-CM

## 2016-10-17 DIAGNOSIS — N838 Other noninflammatory disorders of ovary, fallopian tube and broad ligament: Secondary | ICD-10-CM

## 2016-10-17 DIAGNOSIS — D251 Intramural leiomyoma of uterus: Secondary | ICD-10-CM

## 2016-10-17 DIAGNOSIS — R1900 Intra-abdominal and pelvic swelling, mass and lump, unspecified site: Secondary | ICD-10-CM

## 2016-10-17 DIAGNOSIS — N938 Other specified abnormal uterine and vaginal bleeding: Secondary | ICD-10-CM | POA: Diagnosis not present

## 2016-10-17 DIAGNOSIS — Z30011 Encounter for initial prescription of contraceptive pills: Secondary | ICD-10-CM | POA: Diagnosis not present

## 2016-10-17 MED ORDER — NORETHINDRONE 0.35 MG PO TABS: 1.0000 | ORAL_TABLET | Freq: Every day | ORAL | 4 refills | Status: DC

## 2016-10-17 NOTE — Progress Notes (Signed)
HPI: Presents to office for scheduled [place] due to hx of fibroid uterus, enlarged uterus at annual exam. Abnormal bleeding/stopped since starting taking Megace last week. History of normal monthly 7 day 3 or 4 days heavy cycles. Same partner/condom use/negative STD screen. Hx of tubal ligation. Smoker. Denies vaginal discharge, urinary symptoms or abdominal pain.  Exam: Appears well. [place]: T/V and T/A anteverted UT enlarged fibroids subserous and intramural. Sampling 28 x 27 mm, 45 x 31 mm, 58 x 59 mm, 29 x 21 mm, 38 x 31 mm, 34 x 21 mm, 33 x 32 mm, 24 x 38 mm, 24 x 22 mm, prominent endometrium 11.3 mm right ovary normal. Left ovary solid calcification 11 x 6 x 9 mm. Negative CFD. Negative CDS.  Uterine Fibroids History of menorrhagia Smoker half pack daily  Plan: Options for menorrhagia reviewed, Micronor 0.35mg , once daily, prescription given, instructed to start immediately after finishing Megace. Continue Megace 40mg  BID until finished Encouraged condom use until permanent partner. Discussed hysterectomy, information given, reviewed risks of anesthesia, infection, clots, hemorrhage. Encouraged smoking cessation. Call if continued problems.

## 2016-10-17 NOTE — Patient Instructions (Signed)
Hysterectomy Information A hysterectomy is a surgery in which your uterus is removed. This surgery may be done to treat various medical problems. After the surgery, you will no longer have menstrual periods. The surgery will also make you unable to become pregnant (sterile). The fallopian tubes and ovaries can be removed (bilateral salpingo-oophorectomy) during this surgery as well. Reasons for a hysterectomy  Persistent, abnormal bleeding.  Lasting (chronic) pelvic pain or infection.  The lining of the uterus (endometrium) starts growing outside the uterus (endometriosis).  The endometrium starts growing in the muscle of the uterus (adenomyosis).  The uterus falls down into the vagina (pelvic organ prolapse).  Noncancerous growths in the uterus (uterine fibroids) that cause symptoms.  Precancerous cells.  Cervical cancer or uterine cancer. Types of hysterectomies  Supracervical hysterectomy-In this type, the top part of the uterus is removed, but not the cervix.  Total hysterectomy-The uterus and cervix are removed.  Radical hysterectomy-The uterus, the cervix, and the fibrous tissue that holds the uterus in place in the pelvis (parametrium) are removed. Ways a hysterectomy can be performed  Abdominal hysterectomy-A large surgical cut (incision) is made in the abdomen. The uterus is removed through this incision.  Vaginal hysterectomy-An incision is made in the vagina. The uterus is removed through this incision. There are no abdominal incisions.  Conventional laparoscopic hysterectomy-Three or four small incisions are made in the abdomen. A thin, lighted tube with a camera (laparoscope) is inserted into one of the incisions. Other tools are put through the other incisions. The uterus is cut into small pieces. The small pieces are removed through the incisions, or they are removed through the vagina.  Laparoscopically assisted vaginal hysterectomy (LAVH)-Three or four small  incisions are made in the abdomen. Part of the surgery is performed laparoscopically and part vaginally. The uterus is removed through the vagina.  Robot-assisted laparoscopic hysterectomy-A laparoscope and other tools are inserted into 3 or 4 small incisions in the abdomen. A computer-controlled device is used to give the surgeon a 3D image and to help control the surgical instruments. This allows for more precise movements of surgical instruments. The uterus is cut into small pieces and removed through the incisions or removed through the vagina. What are the risks? Possible complications associated with this procedure include:  Bleeding and risk of blood transfusion. Tell your health care provider if you do not want to receive any blood products.  Blood clots in the legs or lung.  Infection.  Injury to surrounding organs.  Problems or side effects related to anesthesia.  Conversion to an abdominal hysterectomy from one of the other techniques. What to expect after a hysterectomy  You will be given pain medicine.  You will need to have someone with you for the first 3-5 days after you go home.  You will need to follow up with your surgeon in 2-4 weeks after surgery to evaluate your progress.  You may have early menopause symptoms such as hot flashes, night sweats, and insomnia.  If you had a hysterectomy for a problem that was not cancer or not a condition that could lead to cancer, then you no longer need Pap tests. However, even if you no longer need a Pap test, a regular exam is a good idea to make sure no other problems are starting. This information is not intended to replace advice given to you by your health care provider. Make sure you discuss any questions you have with your health care provider. Document Released: 12/12/2000  Document Revised: 11/24/2015 Document Reviewed: 02/23/2013 Elsevier Interactive Patient Education  2017 [employer]. Norethindrone tablets  (contraception) What is this medicine? NORETHINDRONE (nor eth IN drone) is an oral contraceptive. The product contains a female hormone known as a progestin. It is used to prevent pregnancy. This medicine may be used for other purposes; ask your health care provider or pharmacist if you have questions. COMMON BRAND NAME(S): Camila, Deblitane 28-Day, Errin, Heather, [place], [place], [place], Nor-QD, Nora-BE, Norlyroc, Ortho Micronor, [employer] 28-Day What should I tell my health care provider before I take this medicine? They need to know if you have any of these conditions: -blood vessel disease or blood clots -breast, cervical, or vaginal cancer -diabetes -heart disease -kidney disease -liver disease -mental depression -migraine -seizures -stroke -vaginal bleeding -an unusual or allergic reaction to norethindrone, other medicines, foods, dyes, or preservatives -pregnant or trying to get pregnant -breast-feeding How should I use this medicine? Take this medicine by mouth with a glass of water. You may take it with or without food. Follow the directions on the prescription label. Take this medicine at the same time each day and in the order directed on the package. Do not take your medicine more often than directed. Contact your pediatrician regarding the use of this medicine in children. Special care may be needed. This medicine has been used in female children who have started having menstrual periods. A patient package insert for the product will be given with each prescription and refill. Read this sheet carefully each time. The sheet may change frequently. Overdosage: If you think you have taken too much of this medicine contact a poison control center or emergency room at once. NOTE: This medicine is only for you. Do not share this medicine with others. What if I miss a dose? Try not to miss a dose. Every time you miss a dose or take a dose late your chance of pregnancy increases.  When 1 pill is missed (even if only 3 hours late), take the missed pill as soon as possible and continue taking a pill each day at the regular time (use a back up method of birth control for the next 48 hours). If more than 1 dose is missed, use an additional birth control method for the rest of your pill pack until menses occurs. Contact your health care professional if more than 1 dose has been missed. What may interact with this medicine? Do not take this medicine with any of the following medications: -amprenavir or fosamprenavir -bosentan This medicine may also interact with the following medications: -antibiotics or medicines for infections, especially rifampin, rifabutin, rifapentine, and griseofulvin, and possibly penicillins or tetracyclines -aprepitant -barbiturate medicines, such as phenobarbital -carbamazepine -felbamate -modafinil -oxcarbazepine -phenytoin -ritonavir or other medicines for HIV infection or AIDS -St. John's wort -topiramate This list may not describe all possible interactions. Give your health care provider a list of all the medicines, herbs, non-prescription drugs, or dietary supplements you use. Also tell them if you smoke, drink alcohol, or use illegal drugs. Some items may interact with your medicine. What should I watch for while using this medicine? Visit your doctor or health care professional for regular checks on your progress. You will need a regular breast and pelvic exam and Pap smear while on this medicine. Use an additional method of birth control during the first cycle that you take these tablets. If you have any reason to think you are pregnant, stop taking this medicine right away and contact your doctor  or health care professional. If you are taking this medicine for hormone related problems, it may take several cycles of use to see improvement in your condition. This medicine does not protect you against HIV infection (AIDS) or any other sexually  transmitted diseases. What side effects may I notice from receiving this medicine? Side effects that you should report to your doctor or health care professional as soon as possible: -breast tenderness or discharge -pain in the abdomen, chest, groin or leg -severe headache -skin rash, itching, or hives -sudden shortness of breath -unusually weak or tired -vision or speech problems -yellowing of skin or eyes Side effects that usually do not require medical attention (report to your doctor or health care professional if they continue or are bothersome): -changes in sexual desire -change in menstrual flow -facial hair growth -fluid retention and swelling -headache -irritability -nausea -weight gain or loss This list may not describe all possible side effects. Call your doctor for medical advice about side effects. You may report side effects to FDA at 1-800-FDA-1088. Where should I keep my medicine? Keep out of the reach of children. Store at room temperature between 15 and 30 degrees C (59 and 86 degrees F). Throw away any unused medicine after the expiration date. NOTE: This sheet is a summary. It may not cover all possible information. If you have questions about this medicine, talk to your doctor, pharmacist, or health care provider.  2018 Elsevier/Gold Standard (2012-03-07 16:41:35)

## 2016-10-23 ENCOUNTER — Telehealth: Payer: Self-pay | Admitting: *Deleted

## 2016-10-23 MED ORDER — TRAMADOL HCL 50 MG PO TABS: ORAL_TABLET | ORAL | 0 refills | Status: DC

## 2016-10-23 NOTE — Telephone Encounter (Signed)
Pt had ultrasound on 10/17/16, was prescribed megace 40 mg twice daily x 2 week, pt stopped medication states bleeding never stopped, slowed down, I explained to pt directions were to complete 2 week of Rx. Pt will start megace again as directed, also c/o intense cramping/pressure, no urinary symptoms. Taking OTC medication to help with cramping and no relief. Pt asked if Rx could be sent? Please advise

## 2016-10-23 NOTE — Telephone Encounter (Signed)
She was to start the Megace 40 mg twice a day for 10 days and then start the oral contraceptive pill that Izora Gala put her on. For her cramp call and Ultram 50 mg 1 by mouth every 6-8 hours when necessary #30

## 2016-10-23 NOTE — Telephone Encounter (Signed)
Pt informed, Rx called in.  

## 2016-10-26 ENCOUNTER — Telehealth: Payer: Self-pay

## 2016-10-26 ENCOUNTER — Ambulatory Visit (INDEPENDENT_AMBULATORY_CARE_PROVIDER_SITE_OTHER): Payer: 59 | Admitting: Gynecology

## 2016-10-26 DIAGNOSIS — R102 Pelvic and perineal pain: Secondary | ICD-10-CM

## 2016-10-26 MED ORDER — KETOROLAC TROMETHAMINE 30 MG/ML IJ SOLN
30.0000 mg | Freq: Once | INTRAMUSCULAR | Status: AC
  Administered 2016-10-26: 30 mg via INTRAVENOUS

## 2016-10-26 MED ORDER — KETOROLAC TROMETHAMINE 10 MG PO TABS: 10.0000 mg | ORAL_TABLET | Freq: Four times a day (QID) | ORAL | 0 refills | Status: DC | PRN

## 2016-10-26 NOTE — Telephone Encounter (Signed)
She can come to the office and give Toradol 30 mg IM and then we can give her the prescription

## 2016-10-26 NOTE — Telephone Encounter (Signed)
Patient informed. Patient said she has had the pain since Monday. She mentioned last Weds she had biopsy and u/s. No fever.  I offered office visit today but she declined stating she wants to watch it through the weekend. I told her if pain persists she should schedule visit Monday and [facility] over the weekend if anything worsened to where she needed care over the weekend.  She agrees and will call Monday for visit if it persists. Toradol Rx sent.

## 2016-10-26 NOTE — Telephone Encounter (Signed)
Patient advised. She will be here at 2pm to have injection.  I will be off this pm but will ask Anderson Malta to call the pharmacy and let them know and see what they need for proof of injection so that they can fill her Rx.

## 2016-10-26 NOTE — Telephone Encounter (Signed)
Patient called Tuesday and received Rx for Tramadol. She said it is causing her to itch all over. She said Percocet did the same thing in the past.   She said she needs Rx for pain because it is severe and she need to be able to work still.

## 2016-10-26 NOTE — Telephone Encounter (Signed)
Call in prescription for Toradol 10 mg 1 by mouth every 6 hours when necessary #30

## 2016-10-26 NOTE — Telephone Encounter (Signed)
Pharmacy cannot fill Toradol Rx. "Black Box warning" on Toradol. Per Manufacturers instruction the pharmacy cannot fill the Rx until patient has had Toradol injection first.

## 2016-10-29 NOTE — Telephone Encounter (Signed)
(  Late entry) I spoke with pharmacy on 10/25/16 and informed them that pt had Toradol injection okay to fill Toradol Rx.

## 2016-11-05 ENCOUNTER — Encounter: Payer: Self-pay | Admitting: Women's Health

## 2016-11-05 DIAGNOSIS — Z8541 Personal history of malignant neoplasm of cervix uteri: Secondary | ICD-10-CM | POA: Insufficient documentation

## 2016-11-14 ENCOUNTER — Encounter: Payer: Self-pay | Admitting: Gynecology

## 2017-01-06 ENCOUNTER — Other Ambulatory Visit: Payer: Self-pay | Admitting: Women's Health

## 2017-01-06 DIAGNOSIS — F5101 Primary insomnia: Secondary | ICD-10-CM

## 2017-01-07 NOTE — Telephone Encounter (Signed)
rx called in

## 2017-01-07 NOTE — Telephone Encounter (Signed)
Ok for refill? 

## 2017-01-07 NOTE — Telephone Encounter (Signed)
Last filled on 08/07/16 with 2 refills

## 2017-04-19 ENCOUNTER — Other Ambulatory Visit: Payer: Self-pay | Admitting: Women's Health

## 2017-04-19 ENCOUNTER — Telehealth: Payer: Self-pay | Admitting: Internal Medicine

## 2017-04-19 DIAGNOSIS — I10 Essential (primary) hypertension: Secondary | ICD-10-CM

## 2017-04-19 DIAGNOSIS — F5101 Primary insomnia: Secondary | ICD-10-CM

## 2017-04-19 MED ORDER — CHLORTHALIDONE 25 MG PO TABS: 25.0000 mg | ORAL_TABLET | Freq: Every day | ORAL | 1 refills | Status: DC

## 2017-04-19 NOTE — Addendum Note (Signed)
Addended by: Aviva Signs M on: 04/19/2017 04:52 PM   Modules accepted: Orders

## 2017-04-19 NOTE — Telephone Encounter (Signed)
Annual scheduled on 06/03/17 with you.

## 2017-04-19 NOTE — Telephone Encounter (Signed)
erx sent

## 2017-04-19 NOTE — Telephone Encounter (Signed)
Est with Ronnald Ramp 04/2016, fixed chart  appt for CPE set up for 11/27 Please advise Please send to [facility] on [address]

## 2017-04-21 NOTE — Telephone Encounter (Signed)
[place] for refill, remind best to not use daily,  Addictive and will not be effective.

## 2017-04-22 NOTE — Telephone Encounter (Signed)
Rx called in 

## 2017-04-28 DIAGNOSIS — K047 Periapical abscess without sinus: Secondary | ICD-10-CM | POA: Diagnosis not present

## 2017-05-28 ENCOUNTER — Encounter: Payer: 59 | Admitting: Internal Medicine

## 2017-06-03 ENCOUNTER — Encounter: Payer: 59 | Admitting: Women's Health

## 2017-06-07 ENCOUNTER — Other Ambulatory Visit: Payer: Self-pay

## 2017-06-07 DIAGNOSIS — Z30011 Encounter for initial prescription of contraceptive pills: Secondary | ICD-10-CM

## 2017-06-12 ENCOUNTER — Other Ambulatory Visit: Payer: Self-pay

## 2017-06-12 DIAGNOSIS — Z30011 Encounter for initial prescription of contraceptive pills: Secondary | ICD-10-CM

## 2017-06-12 MED ORDER — NORETHINDRONE 0.35 MG PO TABS: 1.0000 | ORAL_TABLET | Freq: Every day | ORAL | 1 refills | Status: DC

## 2017-06-21 ENCOUNTER — Other Ambulatory Visit: Payer: Self-pay | Admitting: Internal Medicine

## 2017-06-21 DIAGNOSIS — I10 Essential (primary) hypertension: Secondary | ICD-10-CM

## 2017-06-21 NOTE — Telephone Encounter (Signed)
Called patient to schedule an appointment. She said that due to her schedule with work she is not able to take off so her first availability would not be until February. She is scheduled to see Dr Ronnald Ramp on 08/12/2016. I told her I would let you know and see if medication could be sent in for that long.

## 2017-06-21 NOTE — Telephone Encounter (Signed)
Pt needs to schedule an appt before this medication can be sent in. Once appt is made I will be able to send in enough to get her to the appt.  LOV 04/2016

## 2017-06-26 MED ORDER — CHLORTHALIDONE 25 MG PO TABS: 25.0000 mg | ORAL_TABLET | Freq: Every day | ORAL | 1 refills | Status: DC

## 2017-06-26 NOTE — Telephone Encounter (Signed)
erx sent for 30 and 1

## 2017-08-12 ENCOUNTER — Encounter: Payer: Self-pay | Admitting: Internal Medicine

## 2017-08-12 ENCOUNTER — Ambulatory Visit (INDEPENDENT_AMBULATORY_CARE_PROVIDER_SITE_OTHER): Payer: 59 | Admitting: Internal Medicine

## 2017-08-12 ENCOUNTER — Other Ambulatory Visit (INDEPENDENT_AMBULATORY_CARE_PROVIDER_SITE_OTHER): Payer: 59

## 2017-08-12 VITALS — BP 124/80 | HR 58 | Temp 98.5°F | Resp 16 | Ht 66.5 in | Wt 159.5 lb

## 2017-08-12 DIAGNOSIS — I10 Essential (primary) hypertension: Secondary | ICD-10-CM

## 2017-08-12 DIAGNOSIS — E785 Hyperlipidemia, unspecified: Secondary | ICD-10-CM | POA: Diagnosis not present

## 2017-08-12 DIAGNOSIS — Z23 Encounter for immunization: Secondary | ICD-10-CM | POA: Diagnosis not present

## 2017-08-12 DIAGNOSIS — F17200 Nicotine dependence, unspecified, uncomplicated: Secondary | ICD-10-CM

## 2017-08-12 DIAGNOSIS — E876 Hypokalemia: Secondary | ICD-10-CM

## 2017-08-12 DIAGNOSIS — Z0000 Encounter for general adult medical examination without abnormal findings: Secondary | ICD-10-CM | POA: Diagnosis not present

## 2017-08-12 DIAGNOSIS — F5101 Primary insomnia: Secondary | ICD-10-CM

## 2017-08-12 DIAGNOSIS — T502X5A Adverse effect of carbonic-anhydrase inhibitors, benzothiadiazides and other diuretics, initial encounter: Secondary | ICD-10-CM | POA: Diagnosis not present

## 2017-08-12 DIAGNOSIS — Z0001 Encounter for general adult medical examination with abnormal findings: Secondary | ICD-10-CM | POA: Insufficient documentation

## 2017-08-12 LAB — LIPID PANEL
CHOLESTEROL: 180 mg/dL (ref 0–200)
HDL: 32.4 mg/dL — AB (ref >39.00)
LDL Cholesterol: 135 mg/dL — ABNORMAL HIGH (ref 0–99)
NONHDL: 147.41
Total CHOL/HDL Ratio: 6
Triglycerides: 60 mg/dL (ref 0.0–149.0)
VLDL: 12 mg/dL (ref 0.0–40.0)

## 2017-08-12 LAB — COMPREHENSIVE METABOLIC PANEL
ALK PHOS: 49 U/L (ref 39–117)
ALT: 7 U/L (ref 0–35)
AST: 14 U/L (ref 0–37)
Albumin: 4.3 g/dL (ref 3.5–5.2)
BILIRUBIN TOTAL: 0.5 mg/dL (ref 0.2–1.2)
BUN: 16 mg/dL (ref 6–23)
CO2: 28 mEq/L (ref 19–32)
Calcium: 9.4 mg/dL (ref 8.4–10.5)
Chloride: 98 mEq/L (ref 96–112)
Creatinine, Ser: 0.88 mg/dL (ref 0.40–1.20)
GFR: 72.92 mL/min (ref >60.00)
GLUCOSE: 101 mg/dL — AB (ref 70–99)
POTASSIUM: 2.8 meq/L — AB (ref 3.5–5.1)
SODIUM: 137 meq/L (ref 135–145)
TOTAL PROTEIN: 8.3 g/dL (ref 6.0–8.3)

## 2017-08-12 LAB — CBC WITH DIFFERENTIAL/PLATELET
BASOS PCT: 0.7 %
Basophils Absolute: 42 cells/uL (ref 0–200)
Eosinophils Absolute: 162 cells/uL (ref 15–500)
Eosinophils Relative: 2.7 %
HCT: 33 % — ABNORMAL LOW (ref 35.0–45.0)
Hemoglobin: 10.8 g/dL — ABNORMAL LOW (ref 11.7–15.5)
Lymphs Abs: 2898 cells/uL (ref 850–3900)
MCH: 27.9 pg (ref 27.0–33.0)
MCHC: 32.7 g/dL (ref 32.0–36.0)
MCV: 85.3 fL (ref 80.0–100.0)
MONOS PCT: 9.7 %
MPV: 9.9 fL (ref 7.5–12.5)
NEUTROS PCT: 38.6 %
Neutro Abs: 2316 cells/uL (ref 1500–7800)
PLATELETS: 426 10*3/uL — AB (ref 140–400)
RBC: 3.87 10*6/uL (ref 3.80–5.10)
RDW: 13.9 % (ref 11.0–15.0)
TOTAL LYMPHOCYTE: 48.3 %
WBC mixed population: 582 cells/uL (ref 200–950)
WBC: 6 10*3/uL (ref 3.8–10.8)

## 2017-08-12 MED ORDER — CHLORTHALIDONE 25 MG PO TABS: 25.0000 mg | ORAL_TABLET | Freq: Every day | ORAL | 0 refills | Status: DC

## 2017-08-12 MED ORDER — VARENICLINE TARTRATE 0.5 MG X 11 & 1 MG X 42 PO MISC: ORAL | 0 refills | Status: DC

## 2017-08-12 MED ORDER — SPIRONOLACTONE 25 MG PO TABS: 25.0000 mg | ORAL_TABLET | Freq: Every day | ORAL | 0 refills | Status: DC

## 2017-08-12 MED ORDER — VARENICLINE TARTRATE 1 MG PO TABS: 1.0000 mg | ORAL_TABLET | Freq: Two times a day (BID) | ORAL | 3 refills | Status: DC

## 2017-08-12 MED ORDER — ZOLPIDEM TARTRATE 10 MG PO TABS: 10.0000 mg | ORAL_TABLET | Freq: Every evening | ORAL | 1 refills | Status: DC | PRN

## 2017-08-12 NOTE — Patient Instructions (Signed)

## 2017-08-12 NOTE — Progress Notes (Signed)
Subjective:  Patient ID: Samantha Reed, female    DOB: 11-Nov-1970  Age: 47 y.o. MRN: [id number]  CC: Hypertension and Annual Exam   HPI Samantha Reed presents for a CPX.   She tells me her blood pressure has been well controlled.  She is very active and has had no recent episodes of CP, DOE, palpitations, edema, or fatigue.  She wants to quit smoking and asked for prescription for Chantix.  She has a history of mildly elevated cholesterol level but to date has decided not to treat it.  Outpatient Medications Prior to Visit  Medication Sig Dispense Refill  . norethindrone (ORTHO MICRONOR) 0.35 MG tablet Take 1 tablet (0.35 mg total) by mouth daily. 3 Package 1  . chlorthalidone (HYGROTON) 25 MG tablet Take 1 tablet (25 mg total) by mouth daily. 30 tablet 1  . zolpidem (AMBIEN) 10 MG tablet TAKE 1 TABLET BY MOUTH AT BEDTIME AS NEEDED FOR SLEEP 30 tablet 1  . ketorolac (TORADOL) 10 MG tablet Take 1 tablet (10 mg total) by mouth every 6 (six) hours as needed. 30 tablet 0  . megestrol (MEGACE) 40 MG tablet Take 1 tablet (40 mg total) by mouth 2 (two) times daily. 20 tablet 1   No facility-administered medications prior to visit.     ROS Review of Systems  Constitutional: Negative for activity change, appetite change, diaphoresis, fatigue and unexpected weight change.  HENT: Negative.   Eyes: Negative for visual disturbance.  Respiratory: Negative for cough, chest tightness, shortness of breath and wheezing.   Cardiovascular: Negative for chest pain, palpitations and leg swelling.  Gastrointestinal: Negative for abdominal pain, constipation, diarrhea, nausea and vomiting.  Endocrine: Negative.   Genitourinary: Negative for decreased urine volume, difficulty urinating, menstrual problem, vaginal bleeding and vaginal discharge.  Musculoskeletal: Negative.  Negative for arthralgias and myalgias.  Skin: Negative.  Negative for color change and rash.  Allergic/Immunologic:  Negative.   Neurological: Negative.  Negative for dizziness, weakness, light-headedness, numbness and headaches.  Hematological: Negative for adenopathy. Does not bruise/bleed easily.  Psychiatric/Behavioral: Positive for sleep disturbance. Negative for decreased concentration and dysphoric mood. The patient is not nervous/anxious.     Objective:  BP 124/80 (BP Location: Left Arm, Patient Position: Sitting, Cuff Size: Normal)   Pulse (!) 58   Temp 98.5 F (36.9 C) (Oral)   Resp 16   Ht 5' 6.5" (1.689 m)   Wt 159 lb 8 oz (72.3 kg)   LMP 08/07/2017   SpO2 100%   BMI 25.36 kg/m   BP Readings from Last 3 Encounters:  08/12/17 124/80  10/12/16 122/70  08/08/16 112/64    Wt Readings from Last 3 Encounters:  08/12/17 159 lb 8 oz (72.3 kg)  10/12/16 163 lb (73.9 kg)  08/08/16 158 lb 6.4 oz (71.8 kg)    Physical Exam  Constitutional: She is oriented to person, place, and time. No distress.  HENT:  Mouth/Throat: Oropharynx is clear and moist. No oropharyngeal exudate.  Eyes: Conjunctivae are normal. Left eye exhibits no discharge. No scleral icterus.  Neck: Normal range of motion. Neck supple. No JVD present. No thyromegaly present.  Cardiovascular: Normal rate, regular rhythm and normal heart sounds. Exam reveals no gallop and no friction rub.  No murmur heard. Pulmonary/Chest: Effort normal and breath sounds normal. No respiratory distress. She has no wheezes. She has no rales.  Abdominal: Soft. Bowel sounds are normal. She exhibits no distension and no mass. There is no tenderness.  Musculoskeletal: Normal range of  motion. She exhibits no edema, tenderness or deformity.  Lymphadenopathy:    She has no cervical adenopathy.  Neurological: She is alert and oriented to person, place, and time.  Skin: Skin is warm and dry. No rash noted. She is not diaphoretic. No erythema. No pallor.  Psychiatric: She has a normal mood and affect. Her behavior is normal. Judgment and thought  content normal.  Vitals reviewed.   Lab Results  Component Value Date   WBC 6.0 08/12/2017   HGB 10.8 (L) 08/12/2017   HCT 33.0 (L) 08/12/2017   PLT 426 (H) 08/12/2017   GLUCOSE 101 (H) 08/12/2017   CHOL 180 08/12/2017   TRIG 60.0 08/12/2017   HDL 32.40 (L) 08/12/2017   LDLCALC 135 (H) 08/12/2017   ALT 7 08/12/2017   AST 14 08/12/2017   NA 137 08/12/2017   K 2.8 (LL) 08/12/2017   CL 98 08/12/2017   CREATININE 0.88 08/12/2017   BUN 16 08/12/2017   CO2 28 08/12/2017   TSH 0.88 04/26/2016    Patient was never admitted.  Assessment & Plan:   Samantha Reed was seen today for hypertension and annual exam.  Diagnoses and all orders for this visit:  Essential hypertension, benign- Her blood pressure is well controlled but her potassium level is down to 2.8.  I have asked her to stop taking the thiazide diuretic and will change to a potassium sparing diuretic. -     Comprehensive metabolic panel; Future -     Cancel: CBC with Differential/Platelet; Future -     Discontinue: chlorthalidone (HYGROTON) 25 MG tablet; Take 1 tablet (25 mg total) by mouth daily. -     CBC with Differential/Platelet; Future -     spironolactone (ALDACTONE) 25 MG tablet; Take 1 tablet (25 mg total) by mouth daily.  Routine general medical examination at a health care facility- Exam completed, labs reviewed, she refused a flu vaccine, she was given a Tdap booster, mammogram and Pap smear are up-to-date, patient education material was given. -     Lipid panel; Future  Need for Tdap vaccination -     Tdap vaccine greater than or equal to 7yo IM  Smoker -     varenicline (CHANTIX STARTING MONTH PAK) 0.5 MG X 11 & 1 MG X 42 tablet; Take one 0.5 mg tablet by mouth once daily for 3 days, then increase to one 0.5 mg tablet twice daily for 4 days, then increase to one 1 mg tablet twice daily. -     varenicline (CHANTIX CONTINUING MONTH PAK) 1 MG tablet; Take 1 tablet (1 mg total) by mouth 2 (two) times  daily.  Primary insomnia -     zolpidem (AMBIEN) 10 MG tablet; Take 1 tablet (10 mg total) by mouth at bedtime as needed. for sleep  Diuretic-induced hypokalemia -     spironolactone (ALDACTONE) 25 MG tablet; Take 1 tablet (25 mg total) by mouth daily.  Hyperlipidemia LDL goal <130- Her ASCVD risk score is very slightly elevated at 10%.  She does not want to take a statin and I do not feel compelled that she needs one at this time.   I have discontinued Samantha Reed's megestrol, ketorolac, chlorthalidone, and chlorthalidone. I have also changed her zolpidem. Additionally, I am having her start on varenicline, varenicline, and spironolactone. Lastly, I am having her maintain her norethindrone.  Meds ordered this encounter  Medications  . varenicline (CHANTIX STARTING MONTH PAK) 0.5 MG X 11 & 1 MG X 42 tablet  Sig: Take one 0.5 mg tablet by mouth once daily for 3 days, then increase to one 0.5 mg tablet twice daily for 4 days, then increase to one 1 mg tablet twice daily.    Dispense:  53 tablet    Refill:  0  . varenicline (CHANTIX CONTINUING MONTH PAK) 1 MG tablet    Sig: Take 1 tablet (1 mg total) by mouth 2 (two) times daily.    Dispense:  60 tablet    Refill:  3  . zolpidem (AMBIEN) 10 MG tablet    Sig: Take 1 tablet (10 mg total) by mouth at bedtime as needed. for sleep    Dispense:  30 tablet    Refill:  1    Not to exceed 4 additional fills before 07/06/2017  . DISCONTD: chlorthalidone (HYGROTON) 25 MG tablet    Sig: Take 1 tablet (25 mg total) by mouth daily.    Dispense:  90 tablet    Refill:  0  . spironolactone (ALDACTONE) 25 MG tablet    Sig: Take 1 tablet (25 mg total) by mouth daily.    Dispense:  90 tablet    Refill:  0     Follow-up: Return in about 6 months (around 02/09/2018).  Samantha Calico, MD

## 2017-08-13 ENCOUNTER — Encounter: Payer: Self-pay | Admitting: Internal Medicine

## 2017-08-16 MED ORDER — VARENICLINE TARTRATE 1 MG PO TABS: 1.0000 mg | ORAL_TABLET | Freq: Two times a day (BID) | ORAL | 3 refills | Status: DC

## 2017-08-16 NOTE — Addendum Note (Signed)
Addended by: Karle Barr on: 08/16/2017 08:56 AM   Modules accepted: Orders

## 2017-09-10 ENCOUNTER — Ambulatory Visit (INDEPENDENT_AMBULATORY_CARE_PROVIDER_SITE_OTHER): Payer: 59 | Admitting: Internal Medicine

## 2017-09-10 ENCOUNTER — Other Ambulatory Visit (INDEPENDENT_AMBULATORY_CARE_PROVIDER_SITE_OTHER): Payer: 59

## 2017-09-10 DIAGNOSIS — I10 Essential (primary) hypertension: Secondary | ICD-10-CM | POA: Diagnosis not present

## 2017-09-10 DIAGNOSIS — D539 Nutritional anemia, unspecified: Secondary | ICD-10-CM

## 2017-09-10 DIAGNOSIS — E876 Hypokalemia: Secondary | ICD-10-CM | POA: Diagnosis not present

## 2017-09-10 LAB — BASIC METABOLIC PANEL
BUN: 18 mg/dL (ref 6–23)
CHLORIDE: 98 meq/L (ref 96–112)
CO2: 29 meq/L (ref 19–32)
CREATININE: 1.28 mg/dL — AB (ref 0.40–1.20)
Calcium: 9.9 mg/dL (ref 8.4–10.5)
GFR: 57.24 mL/min — ABNORMAL LOW (ref >60.00)
GLUCOSE: 98 mg/dL (ref 70–99)
Potassium: 3.2 mEq/L — ABNORMAL LOW (ref 3.5–5.1)
Sodium: 134 mEq/L — ABNORMAL LOW (ref 135–145)

## 2017-09-10 LAB — FERRITIN: Ferritin: 3.9 ng/mL — ABNORMAL LOW (ref 10.0–291.0)

## 2017-09-10 LAB — IBC PANEL
Iron: 36 ug/dL — ABNORMAL LOW (ref 42–145)
SATURATION RATIOS: 6.7 % — AB (ref 20.0–50.0)
TRANSFERRIN: 385 mg/dL — AB (ref 212.0–360.0)

## 2017-09-10 LAB — FOLATE: Folate: 7.4 ng/mL (ref >5.9)

## 2017-09-10 LAB — MAGNESIUM: Magnesium: 2.1 mg/dL (ref 1.5–2.5)

## 2017-09-10 LAB — VITAMIN B12: VITAMIN B 12: 992 pg/mL — AB (ref 211–911)

## 2017-09-10 NOTE — Progress Notes (Signed)
   Subjective:  Patient ID: Samantha Reed, female    DOB: 1971-01-01  Age: 47 y.o. MRN: [id number]  CC: No chief complaint on file.   HPI Samantha Reed presents for f/up -she complained about having to pay another co-pay and left without being seen.  Outpatient Medications Prior to Visit  Medication Sig Dispense Refill  . norethindrone (ORTHO MICRONOR) 0.35 MG tablet Take 1 tablet (0.35 mg total) by mouth daily. 3 Package 1  . spironolactone (ALDACTONE) 25 MG tablet Take 1 tablet (25 mg total) by mouth daily. 90 tablet 0  . varenicline (CHANTIX CONTINUING MONTH PAK) 1 MG tablet Take 1 tablet (1 mg total) by mouth 2 (two) times daily. 60 tablet 3  . varenicline (CHANTIX STARTING MONTH PAK) 0.5 MG X 11 & 1 MG X 42 tablet Take one 0.5 mg tablet by mouth once daily for 3 days, then increase to one 0.5 mg tablet twice daily for 4 days, then increase to one 1 mg tablet twice daily. 53 tablet 0  . zolpidem (AMBIEN) 10 MG tablet Take 1 tablet (10 mg total) by mouth at bedtime as needed. for sleep 30 tablet 1   No facility-administered medications prior to visit.     ROS Review of Systems  Objective:  There were no vitals taken for this visit.  BP Readings from Last 3 Encounters:  08/12/17 124/80  10/12/16 122/70  08/08/16 112/64    Wt Readings from Last 3 Encounters:  08/12/17 159 lb 8 oz (72.3 kg)  10/12/16 163 lb (73.9 kg)  08/08/16 158 lb 6.4 oz (71.8 kg)    Physical Exam  Lab Results  Component Value Date   WBC 6.0 08/12/2017   HGB 10.8 (L) 08/12/2017   HCT 33.0 (L) 08/12/2017   PLT 426 (H) 08/12/2017   GLUCOSE 101 (H) 08/12/2017   CHOL 180 08/12/2017   TRIG 60.0 08/12/2017   HDL 32.40 (L) 08/12/2017   LDLCALC 135 (H) 08/12/2017   ALT 7 08/12/2017   AST 14 08/12/2017   NA 137 08/12/2017   K 2.8 (LL) 08/12/2017   CL 98 08/12/2017   CREATININE 0.88 08/12/2017   BUN 16 08/12/2017   CO2 28 08/12/2017   TSH 0.88 04/26/2016    Patient was never  admitted.  Assessment & Plan:   Diagnoses and all orders for this visit:  Essential hypertension, benign -     Magnesium; Future -     Basic metabolic panel; Future  Hypokalemia -     Magnesium; Future -     Basic metabolic panel; Future  Deficiency anemia -     CBC with Differential/Platelet; Future -     Vitamin B12; Future -     IBC panel; Future -     Folate; Future -     Ferritin; Future   I am having Josue Hector maintain her norethindrone, varenicline, zolpidem, spironolactone, and varenicline.  No orders of the defined types were placed in this encounter.    Follow-up: No Follow-up on file.  Scarlette Calico, MD

## 2017-09-11 ENCOUNTER — Telehealth: Payer: Self-pay | Admitting: Internal Medicine

## 2017-09-11 ENCOUNTER — Other Ambulatory Visit: Payer: Self-pay | Admitting: Internal Medicine

## 2017-09-11 DIAGNOSIS — E876 Hypokalemia: Secondary | ICD-10-CM

## 2017-09-11 DIAGNOSIS — D500 Iron deficiency anemia secondary to blood loss (chronic): Secondary | ICD-10-CM | POA: Insufficient documentation

## 2017-09-11 LAB — CBC WITH DIFFERENTIAL/PLATELET
BASOS PCT: 1 % (ref 0.0–3.0)
Basophils Absolute: 0.1 10*3/uL (ref 0.0–0.1)
EOS PCT: 2.3 % (ref 0.0–5.0)
Eosinophils Absolute: 0.2 10*3/uL (ref 0.0–0.7)
HEMATOCRIT: 34.7 % — AB (ref 36.0–46.0)
HEMOGLOBIN: 11.9 g/dL — AB (ref 12.0–15.0)
Lymphocytes Relative: 57.4 % — ABNORMAL HIGH (ref 12.0–46.0)
Lymphs Abs: 3.7 10*3/uL (ref 0.7–4.0)
MCHC: 34.2 g/dL (ref 30.0–36.0)
MCV: 84.8 fl (ref 78.0–100.0)
MONO ABS: 0.5 10*3/uL (ref 0.1–1.0)
Monocytes Relative: 8 % (ref 3.0–12.0)
Neutro Abs: 2 10*3/uL (ref 1.4–7.7)
Neutrophils Relative %: 31.3 % — ABNORMAL LOW (ref 43.0–77.0)
Platelets: 424 10*3/uL — ABNORMAL HIGH (ref 150.0–400.0)
RBC: 4.1 Mil/uL (ref 3.87–5.11)
RDW: 16 % — AB (ref 11.5–15.5)
WBC: 6.5 10*3/uL (ref 4.0–10.5)

## 2017-09-11 MED ORDER — POTASSIUM CHLORIDE CRYS ER 15 MEQ PO TBCR: 15.0000 meq | EXTENDED_RELEASE_TABLET | Freq: Two times a day (BID) | ORAL | 1 refills | Status: DC

## 2017-09-11 MED ORDER — FERROUS SULFATE 325 (65 FE) MG PO TABS: 325.0000 mg | ORAL_TABLET | Freq: Two times a day (BID) | ORAL | 1 refills | Status: DC

## 2017-09-11 NOTE — Telephone Encounter (Signed)
Pt calling for lab results; read to pt and verbalizes understanding. Aware of F/U labs due. Plans on 10/25/17. Result note was not routed to [facility].

## 2017-09-16 ENCOUNTER — Other Ambulatory Visit: Payer: Self-pay | Admitting: Internal Medicine

## 2017-09-16 ENCOUNTER — Telehealth: Payer: Self-pay | Admitting: Internal Medicine

## 2017-09-16 ENCOUNTER — Ambulatory Visit: Payer: Self-pay | Admitting: *Deleted

## 2017-09-16 DIAGNOSIS — I10 Essential (primary) hypertension: Secondary | ICD-10-CM

## 2017-09-16 DIAGNOSIS — E876 Hypokalemia: Secondary | ICD-10-CM

## 2017-09-16 MED ORDER — POTASSIUM CHLORIDE ER 10 MEQ PO TBCR: 20.0000 meq | EXTENDED_RELEASE_TABLET | Freq: Two times a day (BID) | ORAL | 1 refills | Status: DC

## 2017-09-16 NOTE — Telephone Encounter (Signed)
Pharmacy contacted and they informed that the K 10 meq and 20 meq are cheaper.   Please advise if this can be changed? Pharmacy stated that her insurance would pay for the 10 meq bid.

## 2017-09-16 NOTE — Telephone Encounter (Signed)
Can you contact pt and inform that she is due to follow up in May with PCP to have labs redrawn, etc.   Potassium 10 meq was sent in by PCP.   Pharmacy contacted, per PCP, pt K+ is being monitored closely.

## 2017-09-16 NOTE — Telephone Encounter (Signed)
  Reason for Disposition . Caller has medication question only, adult not sick, and triager answers question  Answer Assessment - Initial Assessment Questions 1. SYMPTOMS: "Do you have any symptoms?"     Patient was recently diagnoses with anemia and she has to take Iron- she is concerned about constipation. 2. SEVERITY: If symptoms are present, ask "Are they mild, moderate or severe?"  Advised patient she can use OTC stool softener to help prevent constipation, increase water intake, eat foods that promote bowels to move.      Patient has called with questions and concerns over her medication ferrous sulfate 325 (65 FE) MG tablet. Please call the patient this morning asap at [phone].  Protocols used: MEDICATION QUESTION CALL-A-AH

## 2017-09-16 NOTE — Telephone Encounter (Signed)
Left message letting patient know °

## 2017-09-16 NOTE — Telephone Encounter (Signed)
Patient has called to discuss her concerns about her constipation and iron- which was discussed. She also had another concern: her Klor-Con is too expensive- is cost $53. She wants to know if there is a cheaper alternative.

## 2017-09-16 NOTE — Telephone Encounter (Signed)
Pharmacist from     [id number] - [place], [place] to see if patient is to continue the spironolactone (ALDACTONE) 25 MG tablet alone with the potassium chloride (K-DUR) 10 MEQ tablet stating that these two together could elevated potassium levels

## 2017-09-16 NOTE — Telephone Encounter (Signed)
[facility]  Advanced Heart Failure and Transplant  Heart Transplant Clinic  Follow-up Visit    Primary Care Physician: Brodsky, Mark E  Referring Provider: Brett Justin Berman  Date of Transplant: 08/21/2019  Organ(s) Transplanted: heart  Indication for transplant: Dilated Myopathy: Idiopathic  PHS increased risk donor: Yes    ID. 47 year old female with end-stage HFrEF 2/2 NICM s/p OHT 08/21/19, history of 2R, HTN, HLD and anxiety coming in for f/u of heart transplant.    Interval History:    The patient was last seen on 10/16/21. At that time issues with pain after urologic procedure.    He continues to deal with pain issue largely from prostate surgery. He still has some bleeding and some tissue come out. He tried different strategies and nothing helped. This is really impacting quality of life. He gets tired and frustrated and does not want to take it out on his family.    ROS:  A complete ROS was performed and is negative except as documented in the HPI.      Allergies:  Patient is allergic to cats [other] and dogs [other].    Past Medical History:   Diagnosis Date    Asthma     Atrial fibrillation (CMS-HCC)     Chronic HFrEF (heart failure with reduced ejection fraction) (CMS-HCC)     GERD (gastroesophageal reflux disease)     HTN (hypertension)     Insomnia     Nephrolithiasis     Sinusitis      Patient Active Problem List   Diagnosis    COPD (chronic obstructive pulmonary disease) (CMS-HCC)    Heart transplant, orthotopic, 08/21/2019    Pericardial effusion    Hypertension    Chronic back pain    At risk for infection transmitted from donor    Acute hepatitis C virus infection    Heart transplanted (CMS-HCC)    Acute UTI    Umbilical hernia without obstruction and without gangrene    COVID-19 virus detected    Acute medial meniscus tear of left knee, sequela    Localized osteoarthritis of left knee     Past Surgical History:   Procedure Laterality Date    CARDIAC DEFIBRILLATOR PLACEMENT       PB ANESTH,SHOULDER JOINT,NOS Right      Family History   Problem Relation Name Age of Onset    Hypertension Other      Other Maternal Grandmother          kidney disease needing HD     Social History     Socioeconomic History    Marital status: Single     Spouse name: Not on file    Number of children: Not on file    Years of education: Not on file    Highest education level: Not on file   Occupational History    Not on file   Tobacco Use    Smoking status: Never    Smokeless tobacco: Never    Tobacco comments:     from friends and relatives    Substance and Sexual Activity    Alcohol use: Not Currently     Comment: Prior heavier use, but completely quit in 2016    Drug use: Yes     Comment: eats edible marijuana for pain and insomnia     Sexual activity: Not on file   Other Topics Concern    Not on file   Social History Narrative      Born in [place], also lived in [place], [place], [place], no travel, worked as a carpenter, occasional cedar, no birds, no hot tubs, worked in construction + possible asbestos exposure      Social Determinants of Health     Financial Resource Strain: Not on file   Food Insecurity: Not on file   Transportation Needs: Not on file   Physical Activity: Not on file   Stress: Not on file   Social Connections: Not on file   Intimate Partner Violence: Not on file   Housing Stability: Not on file     Current Outpatient Medications   Medication Sig    albuterol 108 (90 Base) MCG/ACT inhaler Inhale 2 puffs by mouth every 4 hours as needed for Wheezing or Shortness of Breath.    aspirin 81 MG EC tablet Take 1 tablet (81 mg) by mouth daily.    baclofen (LIORESAL) 10 MG tablet Take 2 tablets (20 mg) by mouth nightly.    Blood Glucose Monitoring Suppl (TRUE METRIX METER) w/Device KIT Use as directed    budesonide-formoterol (SYMBICORT) 160-4.5 MCG/ACT inhaler Inhale 2 puffs by mouth every 12 hours.    bumetanide (BUMEX) 1 MG tablet Take 1 tablet (1 mg) by mouth daily as needed (fluid/weight  gain). Do not take unless instructed by Transplant team.    Calcium Carb-Cholecalciferol 600-10 MG-MCG TABS Take 1 tablet by mouth 2 times daily.    Cetirizine HCl (ZERVIATE) 0.24 % SOLN Place 1 drop into both eyes 2 times daily.    clindamycin (CLEOCIN T) 1 % solution Apply 1 Application. topically 2 times daily. Apply to the red bumps on your face up to two times a day.    controlled substance agreement controlled substance agreement    diclofenac (VOLTAREN) 1 % gel Apply 2 g topically 4 times daily.    docusate sodium (COLACE) 100 MG capsule Take 1 capsule (100 mg) by mouth 2 times daily.    DULoxetine (CYMBALTA) 30 MG CR capsule Take 1 capsule (30 mg) by mouth daily.    famotidine (PEPCID) 20 MG tablet Take 1 tablet (20 mg) by mouth 2 times daily.    fluticasone propionate (FLONASE) 50 MCG/ACT nasal spray Spray 1 spray into each nostril 2 times daily.    gabapentin (NEURONTIN) 300 MG capsule Take 1 capsule (300 mg) by mouth every morning AND 1 capsule (300 mg) daily AND 2 capsules (600 mg) every evening.    hydroCHLOROthiazide (HYDRODIURIL) 25 MG tablet Take 1 tablet (25 mg) by mouth daily.    ketoconazole (NIZORAL) 2 % shampoo Use shampoo daily for dandruff    lidocaine (LIDOCAINE PAIN RELIEF) 4 % patch Apply 1 patch topically every 24 hours. Leave patch on for 12 hours, then remove for 12 hours.    lisinopril (PRINIVIL, ZESTRIL) 10 MG tablet Take 2 tablets (20 mg) by mouth daily.    magnesium oxide (MAG-OX) 400 MG tablet Take 1 tablet by mouth daily    melatonin (GNP MELATONIN MAXIMUM STRENGTH) 5 MG tablet Take 2 tablets (10 mg) by mouth at bedtime.    Multiple Vitamin (MULTIVITAMIN) TABS tablet Take 1 tablet by mouth daily.    naloxone (KLOXXADO) 8 mg/0.1 mL nasal spray Call 911! Tilt head and spray intranasally into one nostril as needed for respiratory depression. If patient does not respond or responds and then relapses, repeat using a new nasal spray every 3 minutes until emergency medical assistance  arrives.    NEEDLE, DISP, 25 G 25G X   1" MISC Use to inject testosterone    NIFEdipine (ADALAT CC) 30 MG Controlled-Release tablet Take 1 tablet (30 mg) by mouth nightly.    ondansetron (ZOFRAN) 8 MG tablet Take 1 tablet (8 mg) by mouth every 8 hours as needed for Nausea/Vomiting.    oxyCODONE (ROXICODONE) 10 MG tablet Take 1 tab every 4 hours as needed for moderate pain and 2 tabs every 4 hours as needed for severe pain. Max 10 tabs per day, 28 day supply    phenazopyridine (PYRIDIUM) 100 MG tablet Take 1 tablet (100 mg) by mouth 3 times daily.    polyethylene glycol (GLYCOLAX) 17 GM/SCOOP powder Mix 17 grams in 4-8 oz of liquide and drink by mouth daily as needed (Constipation).    pravastatin (PRAVACHOL) 40 MG tablet Take 1 tablet (40 mg) by mouth every evening.    senna (SENOKOT) 8.6 MG tablet Take 1 tablet (8.6 mg) by mouth daily.    sirolimus (RAPAMUNE) 1 MG tablet Take 2 tablets (2 mg) by mouth every morning.    SYRINGE-NEEDLE, DISP, 3 ML (B-D 3CC LUER-LOK SYR 25GX1") 25G X 1" 3 ML MISC Use as directed to inject testosterone    SYRINGE-NEEDLE, DISP, 3 ML 18G X 1-1/2" 3 ML MISC Use to draw up testosterone    tacrolimus (ENVARSUS XR) 1 MG tablet STOP TAKING since 03/14/22 - remaining on chart for dose adjustments, titratable med.    tacrolimus (ENVARSUS XR) 4 MG tablet Take 1 tablet (4 mg) by mouth every morning.    tamsulosin (FLOMAX) 0.4 MG capsule Take 1 capsule (0.4 mg) by mouth daily.    tamsulosin (FLOMAX) 0.4 MG capsule Take 1 capsule (0.4 mg) by mouth daily.    testosterone cypionate (DEPO-TESTOSTERONE) 200 MG/ML SOLN Inject 1 ml into the muscle every 14 days    traZODone (DESYREL) 50 MG tablet Take 1 tablet (50 mg) by mouth nightly.     Current Facility-Administered Medications   Medication    diphenhydrAMINE (BENADRYL) injection 50 mg    diphenhydrAMINE (BENADRYL) tablet 50 mg     Immunization History   Administered Date(s) Administered    COVID-19 (Moderna) Low Dose Red Cap >= 18 Years 08/12/2020     COVID-19 (Moderna) Red Cap >= 12 Years 09/19/2019, 10/19/2019, 02/17/2020    Hep-A/Hep-B; Twinrix, Adult 09/12/2020    Influenza Vaccine (High Dose) Quadrivalent >=65 Years 05/04/2020    Influenza Vaccine (Unspecified) 03/02/2017    Influenza Vaccine >=6 Months 05/15/2010, 05/15/2011, 07/10/2012, 04/07/2014, 03/21/2018, 04/06/2019    Pneumococcal 13 Vaccine (PREVNAR-13) 05/04/2020    Pneumococcal 23 Vaccine (PNEUMOVAX-23) 05/15/2013, 09/12/2020    Tdap 07/03/2011   Deferred Date(s) Deferred    Pneumococcal 23 Vaccine (PNEUMOVAX-23) 09/03/2019     Physical Exam:  BP 102/69 (BP Location: Right arm, BP Patient Position: Sitting, BP cuff size: Large)   Pulse 98   Temp 98.5 F (36.9 C) (Temporal)   Resp 16   Ht 5' 10" (1.778 m)   Wt 96.2 kg (212 lb)   SpO2 97%   BMI 30.42 kg/m      General Appearance: ***alert, no distress, pleasant affect, cooperative.  Heart:  JVD ***, PMI ***, normal rate and regular rhythm, no murmurs, clicks, or gallops. ***  Lungs: ***clear to auscultation and percussion. No rales, rhonchi, or wheezes noted. No chest deformities noted.  Abdomen: ***BS normal.  Abdomen soft, non-tender.  No masses or organomegaly.  Extremities:  ***no cyanosis, clubbing, or edema. Has 2+ peripheral pulses.        Lab Data:  Lab Results   Component Value Date    BUN 26 (H) 03/14/2022    CREAT 1.98 (H) 03/14/2022    CL 99 03/14/2022    NA 140 03/14/2022    K 4.4 03/14/2022    CA 9.2 03/14/2022    TBILI 0.47 03/14/2022    ALB 4.1 03/14/2022    TP 7.1 03/14/2022    AST 22 03/14/2022    ALK 76 03/14/2022    BICARB 29 03/14/2022    ALT 25 03/14/2022    GLU 126 (H) 03/14/2022     Lab Results   Component Value Date    WBC 7.9 03/14/2022    RBC 5.50 03/14/2022    HGB 15.2 03/14/2022    HCT 46.5 03/14/2022    MCV 84.5 03/14/2022    MCHC 32.7 03/14/2022    RDW 12.3 03/14/2022    PLT 162 03/14/2022    MPV 11.6 03/14/2022     Lab Results   Component Value Date    A1C 5.7 08/11/2021     Lab Results   Component Value Date     TSH 1.63 08/11/2021     Lab Results   Component Value Date    CHOL 105 08/11/2021    HDL 38 08/11/2021    LDLCALC 43 08/11/2021    TRIG 121 08/11/2021     Lab Results   Component Value Date    SIROT 11.5 03/14/2022     Lab Results   Component Value Date    FKTR 6.5 03/14/2022     No results found for: CSATR  Lab Results   Component Value Date    CMVPL Not Detected 06/02/2021     Lab Results   Component Value Date    DSA ABSENT 03/14/2022       Prior Cardiovascular Studies:   Lab Results   Component Value Date    LV Ejection Fraction 59 09/07/2021          Echo 09/07/21  Summary:   1. The left ventricular size is normal. The left ventricular systolic function is normal.   2. No left ventricular hypertrophy.   3. Normal pattern of left ventricular diastolic filling.   4. EF=59%.   5. Compared to prior study EF now 59%, was 69% 09/30/20.     LHC/IVUS 09/05/21  CONCLUSION:                                                                   1. Myocardial bridging with mild systolic compression of the mid segment    of the left anterior descending coronary artery.                              2. No angiographic evidence of coronary artery disease.                      3. Intimal thickness noted in LAD/LM up to 0.5 mm (Stable to slightly       worse compare to 2022).                                                         4. Non significant FFR at apical LAD.                                        5. Left ventricular end diastolic pressure appears normal.        Assessment summary:  47 year old female with end-stage HFrEF 2/2 NICM s/p OHT 08/21/19, history of 2R, HTN, HLD and anxiety coming in for f/u of heart transplant.    Assessment/Plan:  # Hematuria  # Dysuria  # Chronic pain  Assessment: We had a long frank discussion about patient's chronic pain issues and the heart transplant team's role in this. I discussed with him that when I initially agreed to cover his chronic opiate prescription, this was the assumption that he would  have a provider versed in chronic pain after 3-4 months, but we are at 6 months and has unable to find one. Additionally, I had not put him on a pain contract at that time, but he recently used more opiates without asking and I informed him this was not appropriate, but because he had not established guidelines I was not going to stop at this time. However, going forward until he can establish with a pain physician, we will set up a pain contract and he will need to follow through like a usual pain clinic with us with goal of provider in 3-4 months or I may start tapering. I will augment adjuvant agents additionally for now and we can continue to work on this.  Plan:  -pain contract signed  -urine tox monthly  -clinic follow up month  -oxycodone 10 mg tablets PO, 1 tab every 4 hours moderate pain, 2 tabs every 4 hours for severe pain, no more than 10 tablets a day, total 280 per 28 days.   -diclofenac cream for joint pain  -lidocaine patch for back pain  -trial of pyridium  -increase gaba at night  -siro change as below  -cymbalta as below    # End-stage heart failure s/p orthotopic heart transplant  # Chronic Immunosuppression/Immunomodulation  Assessment: While we thought continuing sirolimus would help prevent recurrent scar tissue from prostate procedure, it may be exacerbating factors now with delayed wound healing. Will try mmf for 1 month.  Plan:   - continue envarsus 6 mg daily, goal trough 4-8  - HOLD sirolimus 3 mg daily, goal trough 4-8 for at least 1 month  - start mmf 1000 mg bid for one month to allow healing  - Continue to monitor for renal toxicities, infection risk and malignancy risk  - continue pravastatin 40 mg daily  - continue aspirin 81 mg daily    # Hypertension  Assessment: controlled  Plan:  -continue lisinopril 20 mg daily  -resume hctz  -nifedipine 30 mg daily    # Dyslipidemia  -continue pravastatin 40 mg daily    # Depression  Assessment: improved mood  Plan:  -increase cymbalta to 120  mg daily     RTC in 1 month       Nicholas W Wettersten, MD  Advanced Heart Failure, Mechanical Circulatory Support, Transplant  Pgr: [phone]

## 2017-10-13 DIAGNOSIS — J029 Acute pharyngitis, unspecified: Secondary | ICD-10-CM | POA: Diagnosis not present

## 2017-10-15 ENCOUNTER — Ambulatory Visit: Payer: 59 | Admitting: Women's Health

## 2017-10-15 ENCOUNTER — Encounter: Payer: Self-pay | Admitting: Women's Health

## 2017-10-15 VITALS — BP 120/82 | Ht 66.0 in | Wt 152.0 lb

## 2017-10-15 DIAGNOSIS — Z30011 Encounter for initial prescription of contraceptive pills: Secondary | ICD-10-CM | POA: Diagnosis not present

## 2017-10-15 DIAGNOSIS — Z113 Encounter for screening for infections with a predominantly sexual mode of transmission: Secondary | ICD-10-CM | POA: Diagnosis not present

## 2017-10-15 DIAGNOSIS — Z01419 Encounter for gynecological examination (general) (routine) without abnormal findings: Secondary | ICD-10-CM

## 2017-10-15 MED ORDER — NORETHINDRONE 0.35 MG PO TABS: 1.0000 | ORAL_TABLET | Freq: Every day | ORAL | 3 refills | Status: DC

## 2017-10-15 NOTE — Progress Notes (Signed)
Ronalee Scheunemann 05/14/1971 [id number]    History:    Presents for annual exam.  Monthly cycle on Micronor reports 40-50% less flow, history of fibroids and menorrhagia.  BTL.  Smoker.  Hypertension,  primary care manages labs and meds.  Abnormal Pap in early 20s without needed intervention, normal Paps since.  Normal  mammogram history overdue for mammogram.  Past medical history, past surgical history, family history and social history were all reviewed and documented in the EPIC chart.  2 children doing well.  Originally from [place] but had lived in the [place].  ROS:  A ROS was performed and pertinent positives and negatives are included.  Exam:  Vitals:   10/15/17 1604  BP: 120/82  Weight: 152 lb (68.9 kg)  Height: 5\' 6"  (1.676 m)   Body mass index is 24.53 kg/m.   General appearance:  Normal Thyroid:  Symmetrical, normal in size, without palpable masses or nodularity. Respiratory  Auscultation:  Clear without wheezing or rhonchi Cardiovascular  Auscultation:  Regular rate, without rubs, murmurs or gallops  Edema/varicosities:  Not grossly evident Abdominal  Soft,nontender, without masses, guarding or rebound.  Liver/spleen:  No organomegaly noted  Hernia:  None appreciated  Skin  Inspection:  Grossly normal   Breasts: Examined lying and sitting.     Right: Without masses, retractions, discharge or axillary adenopathy.     Left: Without masses, retractions, discharge or axillary adenopathy. Gentitourinary   Inguinal/mons:  Normal without inguinal adenopathy  External genitalia:  Normal  BUS/Urethra/Skene's glands:  Normal  Vagina:  Normal  Cervix:  Normal  Uterus: 8-week size uterus nontender,  shape and contour.  Midline and mobile  Adnexa/parametria:     Rt: Without masses or tenderness.   Lt: Without masses or tenderness.  Anus and perineum: Normal  Digital rectal exam: Normal sphincter tone without palpated masses or tenderness  Assessment/Plan:  47  y.o. SBF G5P2 for annual exam with no complaints.  Good relief of menorrhagia with Micronor Hypertension-primary care manages labs Fibroid uterus Half a pack smoker STD screen  Pain: Micronor prescription, proper use, slight risk for blood clots and strokes reviewed.  Instructed to call if cycle/greater than 7 days or menorrhagia occurs.  Aware of hazards of smoking, strongly encouraged to quit or decrease.  SBE's, overdue for mammogram instructed to schedule breast center information given.  Reviewed importance of annual screening.  Exercise, calcium rich foods, vitamin D 2000 daily encouraged.  GC/chlamydia, HIV, hep B, C, RPR per request.  Pap with HR HPV typing,    Huel Cote [facility], 4:25 PM 10/15/2017

## 2017-10-15 NOTE — Patient Instructions (Addendum)
Breast center  [phone]  Health Maintenance, Female Adopting a healthy lifestyle and getting preventive care can go a long way to promote health and wellness. Talk with your health care provider about what schedule of regular examinations is right for you. This is a good chance for you to check in with your provider about disease prevention and staying healthy. In between checkups, there are plenty of things you can do on your own. Experts have done a lot of research about which lifestyle changes and preventive measures are most likely to keep you healthy. Ask your health care provider for more information. Weight and diet Eat a healthy diet  Be sure to include plenty of vegetables, fruits, low-fat dairy products, and lean protein.  Do not eat a lot of foods high in solid fats, added sugars, or salt.  Get regular exercise. This is one of the most important things you can do for your health. ? Most adults should exercise for at least 150 minutes each week. The exercise should increase your heart rate and make you sweat (moderate-intensity exercise). ? Most adults should also do strengthening exercises at least twice a week. This is in addition to the moderate-intensity exercise.  Maintain a healthy weight  Body mass index (BMI) is a measurement that can be used to identify possible weight problems. It estimates body fat based on height and weight. Your health care provider can help determine your BMI and help you achieve or maintain a healthy weight.  For females 20 years of age and older: ? A BMI below 18.5 is considered underweight. ? A BMI of 18.5 to 24.9 is normal. ? A BMI of 25 to 29.9 is considered overweight. ? A BMI of 30 and above is considered obese.  Watch levels of cholesterol and blood lipids  You should start having your blood tested for lipids and cholesterol at 48 years of age, then have this test every 5 years.  You may need to have your cholesterol levels checked more  often if: ? Your lipid or cholesterol levels are high. ? You are older than 48 years of age. ? You are at high risk for heart disease.  Cancer screening Lung Cancer  Lung cancer screening is recommended for adults 55-80 years old who are at high risk for lung cancer because of a history of smoking.  A yearly low-dose CT scan of the lungs is recommended for people who: ? Currently smoke. ? Have quit within the past 15 years. ? Have at least a 30-pack-year history of smoking. A pack year is smoking an average of one pack of cigarettes a day for 1 year.  Yearly screening should continue until it has been 15 years since you quit.  Yearly screening should stop if you develop a health problem that would prevent you from having lung cancer treatment.  Breast Cancer  Practice breast self-awareness. This means understanding how your breasts normally appear and feel.  It also means doing regular breast self-exams. Let your health care provider know about any changes, no matter how small.  If you are in your 20s or 30s, you should have a clinical breast exam (CBE) by a health care provider every 1-3 years as part of a regular health exam.  If you are 40 or older, have a CBE every year. Also consider having a breast X-ray (mammogram) every year.  If you have a family history of breast cancer, talk to your health care provider about genetic screening.  If   you are at high risk for breast cancer, talk to your health care provider about having an MRI and a mammogram every year.  Breast cancer gene (BRCA) assessment is recommended for women who have family members with BRCA-related cancers. BRCA-related cancers include: ? Breast. ? Ovarian. ? Tubal. ? Peritoneal cancers.  Results of the assessment will determine the need for genetic counseling and BRCA1 and BRCA2 testing.  Cervical Cancer Your health care provider may recommend that you be screened regularly for cancer of the pelvic organs  (ovaries, uterus, and vagina). This screening involves a pelvic examination, including checking for microscopic changes to the surface of your cervix (Pap test). You may be encouraged to have this screening done every 3 years, beginning at age 21.  For women ages 30-65, health care providers may recommend pelvic exams and Pap testing every 3 years, or they may recommend the Pap and pelvic exam, combined with testing for human papilloma virus (HPV), every 5 years. Some types of HPV increase your risk of cervical cancer. Testing for HPV may also be done on women of any age with unclear Pap test results.  Other health care providers may not recommend any screening for nonpregnant women who are considered low risk for pelvic cancer and who do not have symptoms. Ask your health care provider if a screening pelvic exam is right for you.  If you have had past treatment for cervical cancer or a condition that could lead to cancer, you need Pap tests and screening for cancer for at least 20 years after your treatment. If Pap tests have been discontinued, your risk factors (such as having a new sexual partner) need to be reassessed to determine if screening should resume. Some women have medical problems that increase the chance of getting cervical cancer. In these cases, your health care provider may recommend more frequent screening and Pap tests.  Colorectal Cancer  This type of cancer can be detected and often prevented.  Routine colorectal cancer screening usually begins at 48 years of age and continues through 48 years of age.  Your health care provider may recommend screening at an earlier age if you have risk factors for colon cancer.  Your health care provider may also recommend using home test kits to check for hidden blood in the stool.  A small camera at the end of a tube can be used to examine your colon directly (sigmoidoscopy or colonoscopy). This is done to check for the earliest forms of  colorectal cancer.  Routine screening usually begins at age 50.  Direct examination of the colon should be repeated every 5-10 years through 48 years of age. However, you may need to be screened more often if early forms of precancerous polyps or small growths are found.  Skin Cancer  Check your skin from head to toe regularly.  Tell your health care provider about any new moles or changes in moles, especially if there is a change in a mole's shape or color.  Also tell your health care provider if you have a mole that is larger than the size of a pencil eraser.  Always use sunscreen. Apply sunscreen liberally and repeatedly throughout the day.  Protect yourself by wearing long sleeves, pants, a wide-brimmed hat, and sunglasses whenever you are outside.  Heart disease, diabetes, and high blood pressure  High blood pressure causes heart disease and increases the risk of stroke. High blood pressure is more likely to develop in: ? People who have blood pressure   in the high end of the normal range (130-139/85-89 mm Hg). ? People who are overweight or obese. ? People who are African American.  If you are 28-40 years of age, have your blood pressure checked every 3-5 years. If you are 66 years of age or older, have your blood pressure checked every year. You should have your blood pressure measured twice-once when you are at a hospital or clinic, and once when you are not at a hospital or clinic. Record the average of the two measurements. To check your blood pressure when you are not at a hospital or clinic, you can use: ? An automated blood pressure machine at a pharmacy. ? A home blood pressure monitor.  If you are between 22 years and 24 years old, ask your health care provider if you should take aspirin to prevent strokes.  Have regular diabetes screenings. This involves taking a blood sample to check your fasting blood sugar level. ? If you are at a normal weight and have a low risk  for diabetes, have this test once every three years after 48 years of age. ? If you are overweight and have a high risk for diabetes, consider being tested at a younger age or more often. Preventing infection Hepatitis B  If you have a higher risk for hepatitis B, you should be screened for this virus. You are considered at high risk for hepatitis B if: ? You were born in a country where hepatitis B is common. Ask your health care provider which countries are considered high risk. ? Your parents were born in a high-risk country, and you have not been immunized against hepatitis B (hepatitis B vaccine). ? You have HIV or AIDS. ? You use needles to inject street drugs. ? You live with someone who has hepatitis B. ? You have had sex with someone who has hepatitis B. ? You get hemodialysis treatment. ? You take certain medicines for conditions, including cancer, organ transplantation, and autoimmune conditions.  Hepatitis C  Blood testing is recommended for: ? Everyone born from [birth year] through 1966. ? Anyone with known risk factors for hepatitis C.  Sexually transmitted infections (STIs)  You should be screened for sexually transmitted infections (STIs) including gonorrhea and chlamydia if: ? You are sexually active and are younger than 48 years of age. ? You are older than 47 years of age and your health care provider tells you that you are at risk for this type of infection. ? Your sexual activity has changed since you were last screened and you are at an increased risk for chlamydia or gonorrhea. Ask your health care provider if you are at risk.  If you do not have HIV, but are at risk, it may be recommended that you take a prescription medicine daily to prevent HIV infection. This is called pre-exposure prophylaxis (PrEP). You are considered at risk if: ? You are sexually active and do not regularly use condoms or know the HIV status of your partner(s). ? You take drugs by  injection. ? You are sexually active with a partner who has HIV.  Talk with your health care provider about whether you are at high risk of being infected with HIV. If you choose to begin PrEP, you should first be tested for HIV. You should then be tested every 3 months for as long as you are taking PrEP. Pregnancy  If you are premenopausal and you may become pregnant, ask your health care provider about preconception counseling.  If you may become pregnant, take 400 to 800 micrograms (mcg) of folic acid every day.  If you want to prevent pregnancy, talk to your health care provider about birth control (contraception). Osteoporosis and menopause  Osteoporosis is a disease in which the bones lose minerals and strength with aging. This can result in serious bone fractures. Your risk for osteoporosis can be identified using a bone density scan.  If you are 102 years of age or older, or if you are at risk for osteoporosis and fractures, ask your health care provider if you should be screened.  Ask your health care provider whether you should take a calcium or vitamin D supplement to lower your risk for osteoporosis.  Menopause may have certain physical symptoms and risks.  Hormone replacement therapy may reduce some of these symptoms and risks. Talk to your health care provider about whether hormone replacement therapy is right for you. Follow these instructions at home:  Schedule regular health, dental, and eye exams.  Stay current with your immunizations.  Do not use any tobacco products including cigarettes, chewing tobacco, or electronic cigarettes.  If you are pregnant, do not drink alcohol.  If you are breastfeeding, limit how much and how often you drink alcohol.  Limit alcohol intake to no more than 1 drink per day for nonpregnant women. One drink equals 12 ounces of beer, 5 ounces of wine, or 1 ounces of hard liquor.  Do not use street drugs.  Do not share needles.  Ask  your health care provider for help if you need support or information about quitting drugs.  Tell your health care provider if you often feel depressed.  Tell your health care provider if you have ever been abused or do not feel safe at home. This information is not intended to replace advice given to you by your health care provider. Make sure you discuss any questions you have with your health care provider. Document Released: 01/01/2011 Document Revised: 11/24/2015 Document Reviewed: 03/22/2015 Elsevier Interactive Patient Education  2018 [place] with Quitting Smoking Quitting smoking is a physical and mental challenge. You will face cravings, withdrawal symptoms, and temptation. Before quitting, work with your health care provider to make a plan that can help you cope. Preparation can help you quit and keep you from giving in. How can I cope with cravings? Cravings usually last for 5-10 minutes. If you get through it, the craving will pass. Consider taking the following actions to help you cope with cravings:  Keep your mouth busy: ? Chew sugar-free gum. ? Suck on hard candies or a straw. ? Brush your teeth.  Keep your hands and body busy: ? Immediately change to a different activity when you feel a craving. ? Squeeze or play with a ball. ? Do an activity or a hobby, like making bead jewelry, practicing needlepoint, or working with wood. ? Mix up your normal routine. ? Take a short exercise break. Go for a quick walk or run up and down stairs. ? Spend time in public places where smoking is not allowed.  Focus on doing something kind or helpful for someone else.  Call a friend or family member to talk during a craving.  Join a support group.  Call a quit line, such as 1-800-QUIT-NOW.  Talk with your health care provider about medicines that might help you cope with cravings and make quitting easier for you.  How can I deal with withdrawal symptoms? Your body  may  experience negative effects as it tries to get used to not having nicotine in the system. These effects are called withdrawal symptoms. They may include:  Feeling hungrier than normal.  Trouble concentrating.  Irritability.  Trouble sleeping.  Feeling depressed.  Restlessness and agitation.  Craving a cigarette.  To manage withdrawal symptoms:  Avoid places, people, and activities that trigger your cravings.  Remember why you want to quit.  Get plenty of sleep.  Avoid coffee and other caffeinated drinks. These may worsen some of your symptoms.  How can I handle social situations? Social situations can be difficult when you are quitting smoking, especially in the first few weeks. To manage this, you can:  Avoid parties, bars, and other social situations where people might be smoking.  Avoid alcohol.  Leave right away if you have the urge to smoke.  Explain to your family and friends that you are quitting smoking. Ask for understanding and support.  Plan activities with friends or family where smoking is not an option.  What are some ways I can cope with stress? Wanting to smoke may cause stress, and stress can make you want to smoke. Find ways to manage your stress. Relaxation techniques can help. For example:  Breathe slowly and deeply, in through your nose and out through your mouth.  Listen to soothing, relaxing music.  Talk with a family member or friend about your stress.  Light a candle.  Soak in a bath or take a shower.  Think about a peaceful place.  What are some ways I can prevent weight gain? Be aware that many people gain weight after they quit smoking. However, not everyone does. To keep from gaining weight, have a plan in place before you quit and stick to the plan after you quit. Your plan should include:  Having healthy snacks. When you have a craving, it may help to: ? Eat plain popcorn, crunchy carrots, celery, or other cut  vegetables. ? Chew sugar-free gum.  Changing how you eat: ? Eat small portion sizes at meals. ? Eat 4-6 small meals throughout the day instead of 1-2 large meals a day. ? Be mindful when you eat. Do not watch television or do other things that might distract you as you eat.  Exercising regularly: ? Make time to exercise each day. If you do not have time for a long workout, do short bouts of exercise for 5-10 minutes several times a day. ? Do some form of strengthening exercise, like weight lifting, and some form of aerobic exercise, like running or swimming.  Drinking plenty of water or other low-calorie or no-calorie drinks. Drink 6-8 glasses of water daily, or as much as instructed by your health care provider.  Summary  Quitting smoking is a physical and mental challenge. You will face cravings, withdrawal symptoms, and temptation to smoke again. Preparation can help you as you go through these challenges.  You can cope with cravings by keeping your mouth busy (such as by chewing gum), keeping your body and hands busy, and making calls to family, friends, or a helpline for people who want to quit smoking.  You can cope with withdrawal symptoms by avoiding places where people smoke, avoiding drinks with caffeine, and getting plenty of rest.  Ask your health care provider about the different ways to prevent weight gain, avoid stress, and handle social situations. This information is not intended to replace advice given to you by your health care provider. Make sure you discuss any  questions you have with your health care provider. Document Released: 06/15/2016 Document Revised: 06/15/2016 Document Reviewed: 06/15/2016 Elsevier Interactive Patient Education  2018 [employer].  Carbohydrate Counting for Diabetes Mellitus, Adult Carbohydrate counting is a method for keeping track of how many carbohydrates you eat. Eating carbohydrates naturally increases the amount of sugar (glucose) in  the blood. Counting how many carbohydrates you eat helps keep your blood glucose within normal limits, which helps you manage your diabetes (diabetes mellitus). It is important to know how many carbohydrates you can safely have in each meal. This is different for every person. A diet and nutrition specialist (registered dietitian) can help you make a meal plan and calculate how many carbohydrates you should have at each meal and snack. Carbohydrates are found in the following foods:  Grains, such as breads and cereals.  Dried beans and soy products.  Starchy vegetables, such as potatoes, peas, and corn.  Fruit and fruit juices.  Milk and yogurt.  Sweets and snack foods, such as cake, cookies, candy, chips, and soft drinks.  How do I count carbohydrates? There are two ways to count carbohydrates in food. You can use either of the methods or a combination of both. Reading "Nutrition Facts" on packaged food The "Nutrition Facts" list is included on the labels of almost all packaged foods and beverages in the [place] It includes:  The serving size.  Information about nutrients in each serving, including the grams (g) of carbohydrate per serving.  To use the "Nutrition Facts":  Decide how many servings you will have.  Multiply the number of servings by the number of carbohydrates per serving.  The resulting number is the total amount of carbohydrates that you will be having.  Learning standard serving sizes of other foods When you eat foods containing carbohydrates that are not packaged or do not include "Nutrition Facts" on the label, you need to measure the servings in order to count the amount of carbohydrates:  Measure the foods that you will eat with a food scale or measuring cup, if needed.  Decide how many standard-size servings you will eat.  Multiply the number of servings by 15. Most carbohydrate-rich foods have about 15 g of carbohydrates per serving. ? For example, if you  eat 8 oz (170 g) of strawberries, you will have eaten 2 servings and 30 g of carbohydrates (2 servings x 15 g = 30 g).  For foods that have more than one food mixed, such as soups and casseroles, you must count the carbohydrates in each food that is included.  The following list contains standard serving sizes of common carbohydrate-rich foods. Each of these servings has about 15 g of carbohydrates:   hamburger bun or  English muffin.   oz (15 mL) syrup.   oz (14 g) jelly.  1 slice of bread.  1 six-inch tortilla.  3 oz (85 g) cooked rice or pasta.  4 oz (113 g) cooked dried beans.  4 oz (113 g) starchy vegetable, such as peas, corn, or potatoes.  4 oz (113 g) hot cereal.  4 oz (113 g) mashed potatoes or  of a large baked potato.  4 oz (113 g) canned or frozen fruit.  4 oz (120 mL) fruit juice.  4-6 crackers.  6 chicken nuggets.  6 oz (170 g) unsweetened dry cereal.  6 oz (170 g) plain fat-free yogurt or yogurt sweetened with artificial sweeteners.  8 oz (240 mL) milk.  8 oz (170 g) fresh fruit  or one small piece of fruit.  24 oz (680 g) popped popcorn.  Example of carbohydrate counting Sample meal  3 oz (85 g) chicken breast.  6 oz (170 g) brown rice.  4 oz (113 g) corn.  8 oz (240 mL) milk.  8 oz (170 g) strawberries with sugar-free whipped topping. Carbohydrate calculation 1. Identify the foods that contain carbohydrates: ? Rice. ? Corn. ? Milk. ? Strawberries. 2. Calculate how many servings you have of each food: ? 2 servings rice. ? 1 serving corn. ? 1 serving milk. ? 1 serving strawberries. 3. Multiply each number of servings by 15 g: ? 2 servings rice x 15 g = 30 g. ? 1 serving corn x 15 g = 15 g. ? 1 serving milk x 15 g = 15 g. ? 1 serving strawberries x 15 g = 15 g. 4. Add together all of the amounts to find the total grams of carbohydrates eaten: ? 30 g + 15 g + 15 g + 15 g = 75 g of carbohydrates total. This information is not  intended to replace advice given to you by your health care provider. Make sure you discuss any questions you have with your health care provider. Document Released: 06/18/2005 Document Revised: 01/06/2016 Document Reviewed: 11/30/2015 Elsevier Interactive Patient Education  [employer].

## 2017-10-16 ENCOUNTER — Encounter (INDEPENDENT_AMBULATORY_CARE_PROVIDER_SITE_OTHER): Payer: Self-pay

## 2017-10-16 LAB — HEPATITIS B SURFACE ANTIGEN: HEP B S AG: NONREACTIVE

## 2017-10-16 LAB — PAP, TP IMAGING W/ HPV RNA, RFLX HPV TYPE 16,18/45: HPV DNA HIGH RISK: NOT DETECTED

## 2017-10-16 LAB — URINALYSIS, COMPLETE W/RFL CULTURE
BACTERIA UA: NONE SEEN /HPF
BILIRUBIN URINE: NEGATIVE
Glucose, UA: NEGATIVE
Hgb urine dipstick: NEGATIVE
Hyaline Cast: NONE SEEN /LPF
KETONES UR: NEGATIVE
LEUKOCYTE ESTERASE: NEGATIVE
Nitrites, Initial: NEGATIVE
Protein, ur: NEGATIVE
RBC / HPF: NONE SEEN /HPF (ref 0–2)
SPECIFIC GRAVITY, URINE: 1.004 (ref 1.001–1.03)
SQUAMOUS EPITHELIAL / LPF: NONE SEEN /HPF (ref <=5)
WBC, UA: NONE SEEN /HPF (ref 0–5)
pH: 6 (ref 5.0–8.0)

## 2017-10-16 LAB — HEPATITIS C ANTIBODY
HEP C AB: NONREACTIVE
SIGNAL TO CUT-OFF: 0.02 (ref <1.00)

## 2017-10-16 LAB — URINE CULTURE
MICRO NUMBER:: 90466945
Result:: NO GROWTH
SPECIMEN QUALITY:: ADEQUATE

## 2017-10-16 LAB — HIV ANTIBODY (ROUTINE TESTING W REFLEX): HIV 1&2 Ab, 4th Generation: NONREACTIVE

## 2017-10-16 LAB — C. TRACHOMATIS/N. GONORRHOEAE RNA
C. TRACHOMATIS RNA, TMA: NOT DETECTED
N. gonorrhoeae RNA, TMA: NOT DETECTED

## 2017-10-16 LAB — RPR: RPR Ser Ql: NONREACTIVE

## 2017-10-16 LAB — NO CULTURE INDICATED

## 2017-10-25 ENCOUNTER — Other Ambulatory Visit: Payer: 59

## 2017-11-08 ENCOUNTER — Other Ambulatory Visit: Payer: Self-pay | Admitting: Internal Medicine

## 2017-11-08 DIAGNOSIS — I10 Essential (primary) hypertension: Secondary | ICD-10-CM

## 2017-11-08 DIAGNOSIS — E876 Hypokalemia: Secondary | ICD-10-CM

## 2017-11-08 DIAGNOSIS — T502X5A Adverse effect of carbonic-anhydrase inhibitors, benzothiadiazides and other diuretics, initial encounter: Secondary | ICD-10-CM

## 2017-11-08 NOTE — Telephone Encounter (Signed)
Pt needs to be seen for recheck of lab work. Pt may not want to schedule as she has refused in the past. Pt needs to be schedule for follow up with PCP only please.

## 2017-11-08 NOTE — Telephone Encounter (Signed)
Called to inform patient x 3 of note below. The # on file is only giving a busy sound. When patient calls or comes in please make sure # is up to date.

## 2017-11-28 ENCOUNTER — Other Ambulatory Visit: Payer: Self-pay | Admitting: Women's Health

## 2017-11-28 ENCOUNTER — Telehealth: Payer: Self-pay | Admitting: *Deleted

## 2017-11-28 DIAGNOSIS — Z1231 Encounter for screening mammogram for malignant neoplasm of breast: Secondary | ICD-10-CM

## 2017-11-28 NOTE — Telephone Encounter (Signed)
Patient called and left message c/o breast lump and needing order for diag. Mammogram to be placed at breast center. I spoke with patient and explained she will need to schedule OV with you. Patient said " I was just there last month and you felt the lump and told her to schedule mammogram". I told her you will say the same to schedule visit first and no "breast issues" are noted in office note. Please advise

## 2017-11-29 DIAGNOSIS — Z1231 Encounter for screening mammogram for malignant neoplasm of breast: Secondary | ICD-10-CM | POA: Diagnosis not present

## 2017-11-29 NOTE — Telephone Encounter (Signed)
TC, hx of dense fibroglandular tissue, will get the 3D will go to [facility], I had told her she had fibroglandular tissue.

## 2017-12-03 ENCOUNTER — Other Ambulatory Visit: Payer: Self-pay | Admitting: Internal Medicine

## 2017-12-03 DIAGNOSIS — F5101 Primary insomnia: Secondary | ICD-10-CM

## 2017-12-10 DIAGNOSIS — N6002 Solitary cyst of left breast: Secondary | ICD-10-CM | POA: Diagnosis not present

## 2017-12-12 ENCOUNTER — Encounter: Payer: Self-pay | Admitting: Women's Health

## 2018-01-12 ENCOUNTER — Other Ambulatory Visit: Payer: Self-pay | Admitting: Internal Medicine

## 2018-01-12 DIAGNOSIS — E876 Hypokalemia: Secondary | ICD-10-CM

## 2018-01-12 DIAGNOSIS — T502X5A Adverse effect of carbonic-anhydrase inhibitors, benzothiadiazides and other diuretics, initial encounter: Secondary | ICD-10-CM

## 2018-01-12 DIAGNOSIS — I10 Essential (primary) hypertension: Secondary | ICD-10-CM

## 2018-02-20 ENCOUNTER — Telehealth: Payer: Self-pay | Admitting: Internal Medicine

## 2018-02-20 DIAGNOSIS — E876 Hypokalemia: Secondary | ICD-10-CM

## 2018-02-20 DIAGNOSIS — T502X5A Adverse effect of carbonic-anhydrase inhibitors, benzothiadiazides and other diuretics, initial encounter: Secondary | ICD-10-CM

## 2018-02-20 DIAGNOSIS — I10 Essential (primary) hypertension: Secondary | ICD-10-CM

## 2018-02-20 MED ORDER — SPIRONOLACTONE 25 MG PO TABS: 25.0000 mg | ORAL_TABLET | Freq: Every day | ORAL | 0 refills | Status: DC

## 2018-02-20 NOTE — Addendum Note (Signed)
Addended by: Karle Barr on: 02/20/2018 04:19 PM   Modules accepted: Orders

## 2018-02-20 NOTE — Telephone Encounter (Signed)
aldactone refill Last Refill:11/08/17 # 90 Last OV: 09/10/17 PCP: Dr Scarlette Calico Pharmacy: [facility] [address] [place], [place]

## 2018-02-20 NOTE — Telephone Encounter (Signed)
erx has been sent. 30 day with no refills.

## 2018-02-20 NOTE — Telephone Encounter (Signed)
Copied from [place] [id number]. Topic: Quick Communication - Rx Refill/Question >> Feb 20, 2018  2:11 PM Sheran Luz wrote: Medication: spironolactone (ALDACTONE) 25 MG tablet  Has the patient contacted their pharmacy? Yes, pharmacy advised pt to call office  Pt states that she does not understand why she has not been able to get a refill of this medication. Pt states that she has been out for a while. Pt has an appointment on 9/7 but would like a partial refill before then if possible. Pt is also requesting a call back about this medication. [phone]  Preferred Pharmacy (with phone number or street name): [id number] [place], [place] [phone] (Phone) [phone] (Fax)    Agent: Please be advised that RX refills may take up to 3 business days. We ask that you follow-up with your pharmacy.

## 2018-03-11 ENCOUNTER — Ambulatory Visit: Payer: 59 | Admitting: Internal Medicine

## 2018-03-19 ENCOUNTER — Other Ambulatory Visit: Payer: Self-pay | Admitting: Internal Medicine

## 2018-03-19 DIAGNOSIS — E876 Hypokalemia: Secondary | ICD-10-CM

## 2018-03-19 DIAGNOSIS — T502X5A Adverse effect of carbonic-anhydrase inhibitors, benzothiadiazides and other diuretics, initial encounter: Secondary | ICD-10-CM

## 2018-03-19 DIAGNOSIS — I10 Essential (primary) hypertension: Secondary | ICD-10-CM

## 2018-03-20 ENCOUNTER — Ambulatory Visit: Payer: 59 | Admitting: Internal Medicine

## 2018-03-20 ENCOUNTER — Other Ambulatory Visit (INDEPENDENT_AMBULATORY_CARE_PROVIDER_SITE_OTHER): Payer: 59

## 2018-03-20 ENCOUNTER — Ambulatory Visit (INDEPENDENT_AMBULATORY_CARE_PROVIDER_SITE_OTHER)
Admission: RE | Admit: 2018-03-20 | Discharge: 2018-03-20 | Disposition: A | Discharge status: Home / Self Care | Payer: 59 | Source: Ambulatory Visit | Attending: Internal Medicine | Admitting: Internal Medicine

## 2018-03-20 ENCOUNTER — Encounter: Payer: Self-pay | Admitting: Internal Medicine

## 2018-03-20 VITALS — BP 136/96 | HR 74 | Temp 99.3°F | Ht 66.0 in | Wt 161.8 lb

## 2018-03-20 DIAGNOSIS — T502X5A Adverse effect of carbonic-anhydrase inhibitors, benzothiadiazides and other diuretics, initial encounter: Secondary | ICD-10-CM

## 2018-03-20 DIAGNOSIS — E876 Hypokalemia: Secondary | ICD-10-CM

## 2018-03-20 DIAGNOSIS — I10 Essential (primary) hypertension: Secondary | ICD-10-CM

## 2018-03-20 DIAGNOSIS — D500 Iron deficiency anemia secondary to blood loss (chronic): Secondary | ICD-10-CM

## 2018-03-20 DIAGNOSIS — F17200 Nicotine dependence, unspecified, uncomplicated: Secondary | ICD-10-CM

## 2018-03-20 DIAGNOSIS — F17210 Nicotine dependence, cigarettes, uncomplicated: Secondary | ICD-10-CM | POA: Diagnosis not present

## 2018-03-20 LAB — CBC WITH DIFFERENTIAL/PLATELET
BASOS ABS: 0.1 10*3/uL (ref 0.0–0.1)
Basophils Relative: 1 % (ref 0.0–3.0)
Eosinophils Absolute: 0.1 10*3/uL (ref 0.0–0.7)
Eosinophils Relative: 2.1 % (ref 0.0–5.0)
HCT: 37.8 % (ref 36.0–46.0)
Hemoglobin: 12.8 g/dL (ref 12.0–15.0)
LYMPHS PCT: 49.3 % — AB (ref 12.0–46.0)
Lymphs Abs: 2.9 10*3/uL (ref 0.7–4.0)
MCHC: 34 g/dL (ref 30.0–36.0)
MCV: 92.5 fl (ref 78.0–100.0)
MONO ABS: 0.5 10*3/uL (ref 0.1–1.0)
MONOS PCT: 8.6 % (ref 3.0–12.0)
NEUTROS ABS: 2.3 10*3/uL (ref 1.4–7.7)
NEUTROS PCT: 39 % — AB (ref 43.0–77.0)
PLATELETS: 298 10*3/uL (ref 150.0–400.0)
RBC: 4.08 Mil/uL (ref 3.87–5.11)
RDW: 13.8 % (ref 11.5–15.5)
WBC: 5.9 10*3/uL (ref 4.0–10.5)

## 2018-03-20 LAB — IBC PANEL
Iron: 76 ug/dL (ref 42–145)
SATURATION RATIOS: 18.5 % — AB (ref 20.0–50.0)
TRANSFERRIN: 294 mg/dL (ref 212.0–360.0)

## 2018-03-20 LAB — BASIC METABOLIC PANEL
BUN: 9 mg/dL (ref 6–23)
CHLORIDE: 107 meq/L (ref 96–112)
CO2: 27 mEq/L (ref 19–32)
CREATININE: 0.87 mg/dL (ref 0.40–1.20)
Calcium: 9.4 mg/dL (ref 8.4–10.5)
GFR: 89.18 mL/min (ref >60.00)
Glucose, Bld: 81 mg/dL (ref 70–99)
POTASSIUM: 3.8 meq/L (ref 3.5–5.1)
Sodium: 140 mEq/L (ref 135–145)

## 2018-03-20 LAB — HCG, QUANTITATIVE, PREGNANCY: Quantitative HCG: 0.3 m[IU]/mL

## 2018-03-20 LAB — FERRITIN: Ferritin: 9.4 ng/mL — ABNORMAL LOW (ref 10.0–291.0)

## 2018-03-20 MED ORDER — SPIRONOLACTONE 25 MG PO TABS: 25.0000 mg | ORAL_TABLET | Freq: Every day | ORAL | 1 refills | Status: DC

## 2018-03-20 MED ORDER — NEBIVOLOL HCL 10 MG PO TABS
10.0000 mg | ORAL_TABLET | Freq: Every day | ORAL | 0 refills | Status: DC
Start: 2018-03-20 — End: 2018-08-19

## 2018-03-20 NOTE — Progress Notes (Signed)
Subjective:  Patient ID: Samantha Reed, female    DOB: [birth year]  Age: 47 y.o. MRN: [id number]  CC: Anemia and Hypertension   HPI Hayley Horn presents for f/up - She does not monitor her blood pressure.  She has however felt well recently with no headache, blurred vision, chest pain, DOE, shortness of breath, fatigue or edema.  She tells me she continues to have menstrual cycles that last about 4 to 5 days with moderate bleeding.  She is taking the iron tablet but only once a day.  Outpatient Medications Prior to Visit  Medication Sig Dispense Refill  . ferrous sulfate 325 (65 FE) MG tablet Take 1 tablet (325 mg total) by mouth 2 (two) times daily with a meal. 180 tablet 1  . norethindrone (ORTHO MICRONOR) 0.35 MG tablet Take 1 tablet (0.35 mg total) by mouth daily. 3 Package 3  . potassium chloride (K-DUR) 10 MEQ tablet Take 2 tablets (20 mEq total) by mouth 2 (two) times daily. 360 tablet 1  . zolpidem (AMBIEN) 10 MG tablet TAKE 1 TABLET BY MOUTH AT BEDTIME AS NEEDED FOR SLEEP 30 tablet 1  . spironolactone (ALDACTONE) 25 MG tablet Take 1 tablet (25 mg total) by mouth daily. 30 tablet 0   No facility-administered medications prior to visit.     ROS Review of Systems  Constitutional: Negative for chills, diaphoresis, fatigue and unexpected weight change.  HENT: Negative.   Eyes: Negative for visual disturbance.  Respiratory: Negative for chest tightness, shortness of breath and wheezing.   Cardiovascular: Negative for chest pain, palpitations and leg swelling.  Gastrointestinal: Negative for abdominal pain, constipation, diarrhea, nausea and vomiting.  Endocrine: Negative.   Genitourinary: Negative.  Negative for decreased urine volume, difficulty urinating, dysuria, menstrual problem and vaginal bleeding.  Musculoskeletal: Negative.  Negative for arthralgias.  Skin: Negative.   Neurological: Negative.  Negative for dizziness, weakness, light-headedness and headaches.   Hematological: Negative for adenopathy. Does not bruise/bleed easily.  Psychiatric/Behavioral: Negative.     Objective:  BP (!) 136/96 (BP Location: Left Arm, Patient Position: Sitting, Cuff Size: Normal)   Pulse 74   Temp 99.3 F (37.4 C) (Oral)   Ht 5\' 6"  (1.676 m)   Wt 161 lb 12 oz (73.4 kg)   LMP 02/04/2018   SpO2 96%   BMI 26.11 kg/m   BP Readings from Last 3 Encounters:  03/20/18 (!) 136/96  10/15/17 120/82  08/12/17 124/80    Wt Readings from Last 3 Encounters:  03/20/18 161 lb 12 oz (73.4 kg)  10/15/17 152 lb (68.9 kg)  08/12/17 159 lb 8 oz (72.3 kg)    Physical Exam  Constitutional: She is oriented to person, place, and time. No distress.  HENT:  Mouth/Throat: Oropharynx is clear and moist. No oropharyngeal exudate.  Eyes: Conjunctivae are normal. No scleral icterus.  Neck: Normal range of motion. Neck supple. No JVD present. No thyromegaly present.  Cardiovascular: Normal rate, regular rhythm and normal heart sounds. Exam reveals no gallop and no friction rub.  No murmur heard. Pulmonary/Chest: Effort normal and breath sounds normal. She has no wheezes. She has no rales.  Abdominal: Soft. Bowel sounds are normal. She exhibits no mass. There is no hepatosplenomegaly. There is no tenderness.  Musculoskeletal: Normal range of motion. She exhibits no edema, tenderness or deformity.  Lymphadenopathy:    She has no cervical adenopathy.  Neurological: She is alert and oriented to person, place, and time.  Skin: Skin is warm and dry. She is  not diaphoretic. No pallor.  Vitals reviewed.   Lab Results  Component Value Date   WBC 5.9 03/20/2018   HGB 12.8 03/20/2018   HCT 37.8 03/20/2018   PLT 298.0 03/20/2018   GLUCOSE 81 03/20/2018   CHOL 180 08/12/2017   TRIG 60.0 08/12/2017   HDL 32.40 (L) 08/12/2017   LDLCALC 135 (H) 08/12/2017   ALT 7 08/12/2017   AST 14 08/12/2017   NA 140 03/20/2018   K 3.8 03/20/2018   CL 107 03/20/2018   CREATININE 0.87  03/20/2018   BUN 9 03/20/2018   CO2 27 03/20/2018   TSH 0.88 04/26/2016    Patient was never admitted.  Assessment & Plan:   Eisa was seen today for anemia and hypertension.  Diagnoses and all orders for this visit:  Hypokalemia- Her potassium level is in the normal range now.  Will continue the current dose of spironolactone. -     Basic metabolic panel; Future -     spironolactone (ALDACTONE) 25 MG tablet; Take 1 tablet (25 mg total) by mouth daily.  Iron deficiency anemia due to chronic blood loss- Her H&H are normal now.  I have asked her to continue taking the iron supplement as directed. -     CBC with Differential/Platelet; Future -     IBC panel; Future -     Ferritin; Future  Essential hypertension, benign- Her blood pressure is not adequately well controlled.  Electrolytes and renal function are normal.  Will add nebivolol to spironolactone for better blood pressure control. -     Basic metabolic panel; Future -     nebivolol (BYSTOLIC) 10 MG tablet; Take 1 tablet (10 mg total) by mouth daily. -     hCG, quantitative, pregnancy; Future -     spironolactone (ALDACTONE) 25 MG tablet; Take 1 tablet (25 mg total) by mouth daily.  Smoker- Her chest x-ray is negative for mass or emphysema.  She was advised to quit smoking. -     DG Chest 2 View; Future  Diuretic-induced hypokalemia   I am having Josue Hector start on nebivolol. I am also having her maintain her ferrous sulfate, potassium chloride, norethindrone, zolpidem, and spironolactone.  Meds ordered this encounter  Medications  . nebivolol (BYSTOLIC) 10 MG tablet    Sig: Take 1 tablet (10 mg total) by mouth daily.    Dispense:  90 tablet    Refill:  0  . spironolactone (ALDACTONE) 25 MG tablet    Sig: Take 1 tablet (25 mg total) by mouth daily.    Dispense:  90 tablet    Refill:  1     Follow-up: Return in about 6 weeks (around 05/01/2018).  Scarlette Calico, MD

## 2018-03-20 NOTE — Patient Instructions (Signed)

## 2018-03-21 ENCOUNTER — Encounter: Payer: Self-pay | Admitting: Internal Medicine

## 2018-04-01 ENCOUNTER — Other Ambulatory Visit: Payer: Self-pay | Admitting: Internal Medicine

## 2018-04-01 DIAGNOSIS — F5101 Primary insomnia: Secondary | ICD-10-CM

## 2018-04-02 NOTE — Telephone Encounter (Signed)
Controlled Substance Datatbase checked and last fill for zolpidem rx rq was 01/12/2018 LOV was 03/20/2018 NOV is NOT scheduled  Can you advise in PCP absence.

## 2018-05-27 ENCOUNTER — Other Ambulatory Visit: Payer: Self-pay | Admitting: Family

## 2018-05-27 DIAGNOSIS — F5101 Primary insomnia: Secondary | ICD-10-CM

## 2018-05-27 NOTE — Telephone Encounter (Signed)
Last filled on 04/02/2018. LOV was in September 19th.

## 2018-07-02 ENCOUNTER — Other Ambulatory Visit: Payer: Self-pay | Admitting: Internal Medicine

## 2018-07-02 ENCOUNTER — Other Ambulatory Visit: Payer: Self-pay | Admitting: Women's Health

## 2018-07-02 DIAGNOSIS — Z30011 Encounter for initial prescription of contraceptive pills: Secondary | ICD-10-CM

## 2018-07-03 NOTE — Telephone Encounter (Addendum)
Chlorthalidone was discontinued in February of 2019.   Pt should be taking the spironolactone.   Pt was due for follow up in 6 weeks after the appt in September 2019. Refill has been denied.   Pt has been scheduled for her 6 weeks follow up.

## 2018-07-29 ENCOUNTER — Ambulatory Visit: Payer: 59 | Admitting: Internal Medicine

## 2018-08-19 ENCOUNTER — Other Ambulatory Visit (INDEPENDENT_AMBULATORY_CARE_PROVIDER_SITE_OTHER): Payer: 59

## 2018-08-19 ENCOUNTER — Encounter: Payer: Self-pay | Admitting: Internal Medicine

## 2018-08-19 ENCOUNTER — Ambulatory Visit: Payer: 59 | Admitting: Internal Medicine

## 2018-08-19 VITALS — BP 136/72 | HR 58 | Temp 98.5°F | Resp 16 | Ht 66.0 in | Wt 157.0 lb

## 2018-08-19 DIAGNOSIS — D500 Iron deficiency anemia secondary to blood loss (chronic): Secondary | ICD-10-CM

## 2018-08-19 DIAGNOSIS — E876 Hypokalemia: Secondary | ICD-10-CM

## 2018-08-19 DIAGNOSIS — Z0000 Encounter for general adult medical examination without abnormal findings: Secondary | ICD-10-CM

## 2018-08-19 DIAGNOSIS — I10 Essential (primary) hypertension: Secondary | ICD-10-CM

## 2018-08-19 DIAGNOSIS — E785 Hyperlipidemia, unspecified: Secondary | ICD-10-CM

## 2018-08-19 LAB — LIPID PANEL
CHOL/HDL RATIO: 5
Cholesterol: 169 mg/dL (ref 0–200)
HDL: 30.8 mg/dL — ABNORMAL LOW (ref >39.00)
LDL Cholesterol: 120 mg/dL — ABNORMAL HIGH (ref 0–99)
NonHDL: 137.87
Triglycerides: 87 mg/dL (ref 0.0–149.0)
VLDL: 17.4 mg/dL (ref 0.0–40.0)

## 2018-08-19 LAB — COMPREHENSIVE METABOLIC PANEL
ALT: 5 U/L (ref 0–35)
AST: 12 U/L (ref 0–37)
Albumin: 4.1 g/dL (ref 3.5–5.2)
Alkaline Phosphatase: 49 U/L (ref 39–117)
BUN: 11 mg/dL (ref 6–23)
CO2: 26 mEq/L (ref 19–32)
Calcium: 9.2 mg/dL (ref 8.4–10.5)
Chloride: 103 mEq/L (ref 96–112)
Creatinine, Ser: 0.96 mg/dL (ref 0.40–1.20)
GFR: 74.77 mL/min (ref >60.00)
Glucose, Bld: 78 mg/dL (ref 70–99)
Potassium: 3.6 mEq/L (ref 3.5–5.1)
Sodium: 135 mEq/L (ref 135–145)
Total Bilirubin: 0.3 mg/dL (ref 0.2–1.2)
Total Protein: 7.7 g/dL (ref 6.0–8.3)

## 2018-08-19 LAB — IBC PANEL
Iron: 35 ug/dL — ABNORMAL LOW (ref 42–145)
Saturation Ratios: 8.1 % — ABNORMAL LOW (ref 20.0–50.0)
Transferrin: 310 mg/dL (ref 212.0–360.0)

## 2018-08-19 LAB — CBC WITH DIFFERENTIAL/PLATELET
Basophils Absolute: 0.1 10*3/uL (ref 0.0–0.1)
Basophils Relative: 1.1 % (ref 0.0–3.0)
EOS PCT: 2.2 % (ref 0.0–5.0)
Eosinophils Absolute: 0.1 10*3/uL (ref 0.0–0.7)
HEMATOCRIT: 34.3 % — AB (ref 36.0–46.0)
Hemoglobin: 11.7 g/dL — ABNORMAL LOW (ref 12.0–15.0)
Lymphs Abs: 3 10*3/uL (ref 0.7–4.0)
MCHC: 34 g/dL (ref 30.0–36.0)
MCV: 91.7 fl (ref 78.0–100.0)
Monocytes Absolute: 0.5 10*3/uL (ref 0.1–1.0)
Monocytes Relative: 10.5 % (ref 3.0–12.0)
Neutro Abs: 1.5 10*3/uL (ref 1.4–7.7)
Neutrophils Relative %: 29.3 % — ABNORMAL LOW (ref 43.0–77.0)
Platelets: 278 10*3/uL (ref 150.0–400.0)
RBC: 3.74 Mil/uL — ABNORMAL LOW (ref 3.87–5.11)
RDW: 13.4 % (ref 11.5–15.5)
WBC: 5.2 10*3/uL (ref 4.0–10.5)

## 2018-08-19 LAB — FERRITIN: Ferritin: 6.3 ng/mL — ABNORMAL LOW (ref 10.0–291.0)

## 2018-08-19 LAB — TSH: TSH: 1.02 u[IU]/mL (ref 0.35–4.50)

## 2018-08-19 LAB — MAGNESIUM: Magnesium: 1.8 mg/dL (ref 1.5–2.5)

## 2018-08-19 NOTE — Patient Instructions (Signed)

## 2018-08-19 NOTE — Progress Notes (Signed)
Subjective:  Patient ID: Samantha Reed, female    DOB: January 30, 1971  Age: 48 y.o. MRN: [id number]  CC: Hypertension; Anemia; and Annual Exam   HPI Helane Briceno presents for a CPX.  She is not compliant with iron replacement therapy and continues to have long heavy periods.  She complains of fatigue and feeling cold all the time.  She is not compliant with nebivolol but she is taking spironolactone for blood pressure control.  She denies any recent episodes of headache, blurred vision, CP, DOE, palpitations, or edema.  Outpatient Medications Prior to Visit  Medication Sig Dispense Refill  . norethindrone (MICRONOR,CAMILA,ERRIN) 0.35 MG tablet TAKE 1 TABLET BY MOUTH EVERY DAY 28 tablet 3  . spironolactone (ALDACTONE) 25 MG tablet Take 1 tablet (25 mg total) by mouth daily. 90 tablet 1  . zolpidem (AMBIEN) 10 MG tablet TAKE 1 TABLET BY MOUTH AT BEDTIME AS NEEDED FOR SLEEP 30 tablet 2  . ferrous sulfate 325 (65 FE) MG tablet Take 1 tablet (325 mg total) by mouth 2 (two) times daily with a meal. (Patient not taking: Reported on 08/19/2018) 180 tablet 1  . nebivolol (BYSTOLIC) 10 MG tablet Take 1 tablet (10 mg total) by mouth daily. (Patient not taking: Reported on 08/19/2018) 90 tablet 0  . potassium chloride (K-DUR) 10 MEQ tablet Take 2 tablets (20 mEq total) by mouth 2 (two) times daily. (Patient not taking: Reported on 08/19/2018) 360 tablet 1   No facility-administered medications prior to visit.     ROS Review of Systems  Constitutional: Positive for fatigue. Negative for unexpected weight change.  HENT: Negative for trouble swallowing.   Respiratory: Negative.  Negative for cough, chest tightness, shortness of breath and wheezing.   Cardiovascular: Negative for chest pain, palpitations and leg swelling.  Gastrointestinal: Negative for abdominal pain, anal bleeding, blood in stool, constipation, diarrhea, nausea and vomiting.  Endocrine: Positive for cold intolerance. Negative  for heat intolerance.  Genitourinary: Positive for menstrual problem. Negative for difficulty urinating.       + long heavy periods  Musculoskeletal: Negative.  Negative for arthralgias and myalgias.  Skin: Negative.   Neurological: Negative.  Negative for dizziness, weakness and light-headedness.  Hematological: Negative for adenopathy. Does not bruise/bleed easily.  Psychiatric/Behavioral: Negative.     Objective:  BP 136/72 (BP Location: Left Arm, Patient Position: Sitting, Cuff Size: Normal)   Pulse (!) 58   Temp 98.5 F (36.9 C) (Oral)   Resp 16   Ht 5\' 6"  (1.676 m)   Wt 157 lb (71.2 kg)   LMP 07/24/2018   SpO2 99%   BMI 25.34 kg/m   BP Readings from Last 3 Encounters:  08/19/18 136/72  03/20/18 (!) 136/96  10/15/17 120/82    Wt Readings from Last 3 Encounters:  08/19/18 157 lb (71.2 kg)  03/20/18 161 lb 12 oz (73.4 kg)  10/15/17 152 lb (68.9 kg)    Physical Exam Vitals signs reviewed.  Constitutional:      Appearance: She is not ill-appearing or diaphoretic.  HENT:     Mouth/Throat:     Mouth: Mucous membranes are moist. Mucous membranes are pale.     Pharynx: Oropharynx is clear. No oropharyngeal exudate or posterior oropharyngeal erythema.  Eyes:     General: No scleral icterus.    Conjunctiva/sclera: Conjunctivae normal.  Neck:     Musculoskeletal: Normal range of motion and neck supple. No neck rigidity or muscular tenderness.  Cardiovascular:     Rate and Rhythm: Normal  rate and regular rhythm.     Pulses: Normal pulses.     Heart sounds: No murmur. No gallop.   Pulmonary:     Effort: Pulmonary effort is normal. No respiratory distress.     Breath sounds: Normal breath sounds. No stridor. No wheezing or rales.  Abdominal:     General: Abdomen is flat. Bowel sounds are normal.     Palpations: Abdomen is soft. There is no mass.     Tenderness: There is no abdominal tenderness. There is no guarding.     Hernia: No hernia is present.    Musculoskeletal: Normal range of motion.        General: No swelling.     Right lower leg: No edema.     Left lower leg: No edema.  Lymphadenopathy:     Cervical: No cervical adenopathy.  Skin:    General: Skin is warm and dry.     Coloration: Skin is not pale.     Findings: No erythema or rash.  Neurological:     General: No focal deficit present.     Mental Status: She is oriented to person, place, and time. Mental status is at baseline.     Lab Results  Component Value Date   WBC 5.2 08/19/2018   HGB 11.7 (L) 08/19/2018   HCT 34.3 (L) 08/19/2018   PLT 278.0 08/19/2018   GLUCOSE 78 08/19/2018   CHOL 169 08/19/2018   TRIG 87.0 08/19/2018   HDL 30.80 (L) 08/19/2018   LDLCALC 120 (H) 08/19/2018   ALT 5 08/19/2018   AST 12 08/19/2018   NA 135 08/19/2018   K 3.6 08/19/2018   CL 103 08/19/2018   CREATININE 0.96 08/19/2018   BUN 11 08/19/2018   CO2 26 08/19/2018   TSH 1.02 08/19/2018    Dg Chest 2 View  Result Date: 03/21/2018 CLINICAL DATA:  Smoker. EXAM: CHEST - 2 VIEW COMPARISON:  None. FINDINGS: The heart size and mediastinal contours are within normal limits. Both lungs are clear. The visualized skeletal structures are unremarkable. IMPRESSION: Normal exam. Electronically Signed   By: Lorriane Shire M.D.   On: 03/21/2018 08:35    Assessment & Plan:   Sihaam was seen today for hypertension, anemia and annual exam.  Diagnoses and all orders for this visit:  Iron deficiency anemia due to chronic blood loss- Hhe is symptomatic with anemia and has a low iron level.  This is caused by menometrorrhagia.  She is working with her gynecologist to control the menstrual bleeding.  In the meantime, I have recommended that she start iron infusions. -     CBC with Differential/Platelet; Future -     IBC panel; Future -     Ferritin; Future  Routine general medical examination at a health care facility- Exam completed, labs reviewed, mammogram and Pap smear up-to-date, she  refused any vaccines today, patient education material was given. -     Lipid panel; Future  Essential hypertension, benign-her blood pressure is adequately well controlled.  Her electrolytes and renal function are normal.  We will continue spironolactone at the current dose. -     Comprehensive metabolic panel; Future -     Magnesium; Future -     TSH; Future  Hypokalemia- Her potassium level is normal now.  Will continue spironolactone at the current dose. -     Comprehensive metabolic panel; Future -     Magnesium; Future  Hyperlipidemia LDL goal <130- She has a mildly elevated  ASCVD risk score so I have asked her to start taking a statin for CV risk reduction. -     rosuvastatin (CRESTOR) 5 MG tablet; Take 1 tablet (5 mg total) by mouth daily.   I have discontinued Chandria Yepez's potassium chloride and nebivolol. I am also having her start on rosuvastatin. Additionally, I am having her maintain her ferrous sulfate, spironolactone, zolpidem, and norethindrone.  Meds ordered this encounter  Medications  . rosuvastatin (CRESTOR) 5 MG tablet    Sig: Take 1 tablet (5 mg total) by mouth daily.    Dispense:  90 tablet    Refill:  1     Follow-up: Return in about 6 months (around 02/17/2019).  Scarlette Calico, MD

## 2018-08-20 ENCOUNTER — Encounter: Payer: Self-pay | Admitting: Internal Medicine

## 2018-08-20 MED ORDER — ROSUVASTATIN CALCIUM 5 MG PO TABS: 5.0000 mg | ORAL_TABLET | Freq: Every day | ORAL | 1 refills | Status: DC

## 2018-08-26 ENCOUNTER — Ambulatory Visit (HOSPITAL_COMMUNITY)
Admission: RE | Admit: 2018-08-26 | Discharge: 2018-08-26 | Disposition: A | Discharge status: Home / Self Care | Payer: 59 | Source: Ambulatory Visit | Attending: Internal Medicine | Admitting: Internal Medicine

## 2018-08-26 DIAGNOSIS — D500 Iron deficiency anemia secondary to blood loss (chronic): Secondary | ICD-10-CM | POA: Diagnosis not present

## 2018-08-26 MED ORDER — SODIUM CHLORIDE 0.9 % IV SOLN
750.0000 mg | Freq: Once | INTRAVENOUS | Status: AC
  Administered 2018-08-26: 750 mg via INTRAVENOUS
  Filled 2018-08-26: qty 15

## 2018-08-26 MED ORDER — SODIUM CHLORIDE 0.9 % IV SOLN
INTRAVENOUS | Status: DC | PRN
  Administered 2018-08-26: 250 mL via INTRAVENOUS

## 2018-08-26 NOTE — Progress Notes (Signed)
PATIENT CARE CENTER NOTE  Diagnosis: Iron Deficiency Anemia due to chronic blood loss   Provider: Scarlette Calico, MD   Procedure: IV Injectafer   Note: Patient received Injectafer infusion. Observed patient for 30 minutes post-infusion. No adverse reaction noted. Vital signs stable. Discharge instructions given. Patient to come back next week for second iron infusion. Alert, oriented and ambulatory at discharge.

## 2018-08-26 NOTE — Discharge Instructions (Signed)

## 2018-09-02 ENCOUNTER — Ambulatory Visit (HOSPITAL_COMMUNITY)
Admission: RE | Admit: 2018-09-02 | Discharge: 2018-09-02 | Disposition: A | Discharge status: Home / Self Care | Payer: 59 | Source: Ambulatory Visit | Attending: Internal Medicine | Admitting: Internal Medicine

## 2018-09-02 DIAGNOSIS — D500 Iron deficiency anemia secondary to blood loss (chronic): Secondary | ICD-10-CM | POA: Diagnosis not present

## 2018-09-02 MED ORDER — SODIUM CHLORIDE 0.9 % IV SOLN
750.0000 mg | Freq: Once | INTRAVENOUS | Status: AC
  Administered 2018-09-02: 750 mg via INTRAVENOUS
  Filled 2018-09-02: qty 15

## 2018-09-02 MED ORDER — SODIUM CHLORIDE 0.9 % IV SOLN
INTRAVENOUS | Status: DC | PRN
  Administered 2018-09-02: 250 mL via INTRAVENOUS

## 2018-09-02 NOTE — Discharge Instructions (Signed)

## 2018-09-02 NOTE — Progress Notes (Signed)
PATIENT CARE CENTER NOTE  Diagnosis: Iron Deficiency Anemia due to chronic blood loss   Provider: Scarlette Calico, MD   Procedure: IV Injectafer   Note: Patient received Injectafer infusion. Observed patient for 30 minutes post-infusion. No adverse reaction noted. Vital signs stable. Discharge instructions given.  Alert, oriented and ambulatory at discharge.

## 2018-10-19 ENCOUNTER — Other Ambulatory Visit: Payer: Self-pay | Admitting: Women's Health

## 2018-10-19 DIAGNOSIS — Z30011 Encounter for initial prescription of contraceptive pills: Secondary | ICD-10-CM

## 2018-11-14 ENCOUNTER — Other Ambulatory Visit: Payer: Self-pay

## 2018-11-17 ENCOUNTER — Encounter: Payer: Self-pay | Admitting: Women's Health

## 2018-11-17 ENCOUNTER — Other Ambulatory Visit: Payer: Self-pay

## 2018-11-17 ENCOUNTER — Ambulatory Visit: Payer: 59 | Admitting: Women's Health

## 2018-11-17 VITALS — BP 128/80

## 2018-11-17 DIAGNOSIS — N920 Excessive and frequent menstruation with regular cycle: Secondary | ICD-10-CM | POA: Diagnosis not present

## 2018-11-17 DIAGNOSIS — Z113 Encounter for screening for infections with a predominantly sexual mode of transmission: Secondary | ICD-10-CM

## 2018-11-17 NOTE — Progress Notes (Addendum)
48 year old SBF G5 P2 presents  to discuss problem of increased/persistent irregular menstrual cycles on Micronor, requesting treatment options to help with the problem.  Asking if a "stronger OCP" could be used, surgery or what options are available.  History of a BTL, fibroids with menorrhagia and reports bleeding 40 to 50% less since starting Micronor but irregular bleeding problematic.  States the irregular bleeding is interfering with life/health, has had to have iron transfusions due to the anemia.  Numerous fibroids largest being 6 cm, questions hysterectomy.  Had recurrent infections with IUD.  Smoker.  Denies vaginal discharge, urinary symptoms, abdominal pain or fever. 09/2016 US.enlarged fibroids subserous and intramural. Sampling 28 x 27 mm, 45 x 31 mm, 58 x 59 mm, 29 x 21 mm, 38 x 31 mm, 34 x 21 mm, 33 x 32 mm, 24 x 38 mm, 24 x 22 mm.  Normal TSH 08/2018 at primary care.   Exam: Appears well.  No physical exam done  Fibroid uterus with menorrhagia/Micronor/BTL requesting treatment options History of anemia requiring iron infusions - primary care managing  Plan: Instructed to schedule consult appointment with Dr. Dellis Filbert to discuss surgical options. Options reviewed endometrial ablation versus hysterectomy.   Reviewed importance of decreasing/no smoking.  Will continue Micronor until plan made.  GC/Chlamydia culture from urine pending.  Reviewed cannot use a combination OCP due to smoking, tips to quit reviewed.

## 2018-11-17 NOTE — Patient Instructions (Addendum)
Endometrial Ablation Endometrial ablation is a procedure that destroys the thin inner layer of the lining of the uterus (endometrium). This procedure may be done:  To stop heavy periods.  To stop bleeding that is causing anemia.  To control irregular bleeding.  To treat bleeding caused by small tumors (fibroids) in the endometrium. This procedure is often an alternative to major surgery, such as removal of the uterus and cervix (hysterectomy). As a result of this procedure:  You may not be able to have children. However, if you are premenopausal (you have not gone through menopause): ? You may still have a small chance of getting pregnant. ? You will need to use a reliable method of birth control after the procedure to prevent pregnancy.  You may stop having a menstrual period, or you may have only a small amount of bleeding during your period. Menstruation may return several years after the procedure. Tell a health care provider about:  Any allergies you have.  All medicines you are taking, including vitamins, herbs, eye drops, creams, and over-the-counter medicines.  Any problems you or family members have had with the use of anesthetic medicines.  Any blood disorders you have.  Any surgeries you have had.  Any medical conditions you have. What are the risks? Generally, this is a safe procedure. However, problems may occur, including:  A hole (perforation) in the uterus or bowel.  Infection of the uterus, bladder, or vagina.  Bleeding.  Damage to other structures or organs.  An air bubble in the lung (air embolus).  Problems with pregnancy after the procedure.  Failure of the procedure.  Decreased ability to diagnose cancer in the endometrium. What happens before the procedure?  You will have tests of your endometrium to make sure there are no pre-cancerous cells or cancer cells present.  You may have an ultrasound of the uterus.  You may be given medicines to  thin the endometrium.  Ask your health care provider about: ? Changing or stopping your regular medicines. This is especially important if you take diabetes medicines or blood thinners. ? Taking medicines such as aspirin and ibuprofen. These medicines can thin your blood. Do not take these medicines before your procedure if your doctor tells you not to.  Plan to have someone take you home from the hospital or clinic. What happens during the procedure?   You will lie on an exam table with your feet and legs supported as in a pelvic exam.  To lower your risk of infection: ? Your health care team will wash or sanitize their hands and put on germ-free (sterile) gloves. ? Your genital area will be washed with soap.  An IV tube will be inserted into one of your veins.  You will be given a medicine to help you relax (sedative).  A surgical instrument with a light and camera (resectoscope) will be inserted into your vagina and moved into your uterus. This allows your surgeon to see inside your uterus.  Endometrial tissue will be removed using one of the following methods: ? Radiofrequency. This method uses a radiofrequency-alternating electric current to remove the endometrium. ? Cryotherapy. This method uses extreme cold to freeze the endometrium. ? Heated-free liquid. This method uses a heated saltwater (saline) solution to remove the endometrium. ? Microwave. This method uses high-energy microwaves to heat up the endometrium and remove it. ? Thermal balloon. This method involves inserting a catheter with a balloon tip into the uterus. The balloon tip is filled with   heated fluid to remove the endometrium. The procedure may vary among health care providers and hospitals. What happens after the procedure?  Your blood pressure, heart rate, breathing rate, and blood oxygen level will be monitored until the medicines you were given have worn off.  As tissue healing occurs, you may notice  vaginal bleeding for 4-6 weeks after the procedure. You may also experience: ? Cramps. ? Thin, watery vaginal discharge that is light pink or brown in color. ? A need to urinate more frequently than usual. ? Nausea.  Do not drive for 24 hours if you were given a sedative.  Do not have sex or insert anything into your vagina until your health care provider approves. Summary  Endometrial ablation is done to treat the many causes of heavy menstrual bleeding.  The procedure may be done only after medications have been tried to control the bleeding.  Plan to have someone take you home from the hospital or clinic. This information is not intended to replace advice given to you by your health care provider. Make sure you discuss any questions you have with your health care provider. Document Released: 04/27/2004 Document Revised: 12/03/2017 Document Reviewed: 07/05/2016 Elsevier Interactive Patient Education  2019 [employer] Uterine Fibroids  Uterine fibroids are lumps of tissue (tumors) in your womb (uterus). They are not cancer (are benign). Most women with this condition do not need treatment. Sometimes fibroids can affect your ability to have children (your fertility). If that happens, you may need surgery to take out the fibroids. Follow these instructions at home:  Take over-the-counter and prescription medicines only as told by your doctor. Your doctor may suggest NSAIDs (such as aspirin or ibuprofen) to help with pain.  Ask your doctor if you should: ? Take iron pills. ? Eat more foods that have iron in them, such as dark green, leafy vegetables.  If directed, apply heat to your back or belly to reduce pain. Use the heat source that your doctor recommends, such as a moist heat pack or a heating pad. ? Put a towel between your skin and the heat source. ? Leave the heat on for 20-30 minutes. ? Remove the heat if your skin turns bright red. This is especially important if you are  unable to feel pain, heat, or cold. You may have a greater risk of getting burned.  Pay close attention to your period (menstrual) cycles. Tell your doctor about any changes, such as: ? A heavier blood flow than usual. ? Needing to use more pads or tampons than normal. ? A change in how many days your period lasts. ? A change in symptoms that come with your period, such as cramps or back pain.  Keep all follow-up visits as told by your doctor. This is important. Your doctor may need to watch your fibroids over time for any changes. Contact a doctor if you:  Have pain that does not get better with medicine or heat, such as pain or cramps in: ? Your back. ? The area between your hip bones (pelvic area). ? Your belly.  Have new bleeding between your periods.  Have more bleeding during or between your periods.  Feel very tired or weak.  Feel light-headed. Get help right away if you:  Pass out (faint).  Have pain in the area between your hip bones that suddenly gets worse.  Have bleeding that soaks a tampon or pad in 30 minutes or less. Summary  Uterine fibroids are lumps of tissue (  tumors) in your womb (uterus). They are not cancer.  The only treatment that most women need is taking aspirin or ibuprofen for pain.  Contact a doctor if you have pain or cramps that do not get better with medicine.  Make sure you know what symptoms you should get help for right away. This information is not intended to replace advice given to you by your health care provider. Make sure you discuss any questions you have with your health care provider. Document Released: 07/21/2010 Document Revised: 05/14/2017 Document Reviewed: 05/14/2017 Elsevier Interactive Patient Education  2019 [employer]. Hysterectomy Information  A hysterectomy is a surgery in which the uterus is removed. The fallopian tubes and ovaries may be removed (bilateral salpingo-oophorectomy) as well. This procedure may be done  to treat various medical problems. After the procedure, a woman will no longer have menstrual periods nor will she be able to become pregnant (sterile). What are the reasons for a hysterectomy? There are many reasons why a woman might have this procedure. They include:  Persistent, abnormal vaginal bleeding.  Long-term (chronic) pelvic pain or infection.  Endometriosis. This is when the lining of the uterus (endometrium) starts to grow outside the uterus.  Adenomyosis. This is when the endometrium starts to grow in the muscle of the uterus.  Pelvic organ prolapse. This is a condition in which the uterus falls down into the vagina.  Noncancerous growths in the uterus (uterine fibroids) that cause symptoms.  The presence of precancerous cells.  Cervical or uterine cancer. What are the different types of hysterectomy? There are three different types of hysterectomy:  Supracervical hysterectomy. In this type, the top part of the uterus is removed, but not the cervix.  Total hysterectomy. In this type, the uterus and cervix are removed.  Radical hysterectomy. In this type, the uterus, the cervix, and the tissue that holds the uterus in place (parametrium) are removed. What are the different ways a hysterectomy can be performed? There are many different ways a hysterectomy can be performed, including:  Abdominal hysterectomy. In this type, an incision is made in the abdomen. The uterus is removed through this incision.  Vaginal hysterectomy. In this type, an incision is made in the vagina. The uterus is removed through this incision. There are no abdominal incisions.  Conventional laparoscopic hysterectomy. In this type, three or four small incisions are made in the abdomen. A thin, lighted tube with a camera (laparoscope) is inserted into one of the incisions. Other tools are put through the other incisions. The uterus is cut into small pieces. The small pieces are removed through the  incisions or through the vagina.  Laparoscopically assisted vaginal hysterectomy (LAVH). In this type, three or four small incisions are made in the abdomen. Part of the surgery is performed laparoscopically and the other part is done vaginally. The uterus is removed through the vagina.  Robot-assisted laparoscopic hysterectomy. In this type, a laparoscope and other tools are inserted into three or four small incisions in the abdomen. A computer-controlled device is used to give the surgeon a 3D image and to help control the surgical instruments. This allows for more precise movements of surgical instruments. The uterus is cut into small pieces and removed through the incisions or removed through the vagina. Discuss the options with your health care provider to determine which type is the right one for you. What are the risks? Generally, this is a safe procedure. However, problems may occur, including:  Bleeding and risk of  blood transfusion. Tell your health care provider if you do not want to receive any blood products.  Blood clots in the legs or lung.  Infection.  Damage to other structures or organs.  Allergic reactions to medicines.  Changing to an abdominal hysterectomy from one of the other techniques. What to expect after a hysterectomy  You will be given pain medicine.  You may need to stay in the hospital for 1- 2 days to recover, depending on the type of hysterectomy you had.  Follow your health care provider's instructions about exercise, driving, and general activities. Ask your health care provider what activities are safe for you.  You will need to have someone with you for the first 3-5 days after you go home.  You will need to follow up with your surgeon in 2-4 weeks after surgery to evaluate your progress.  If the ovaries are removed, you will have early menopause symptoms such as hot flashes, night sweats, and insomnia.  If you had a hysterectomy for a problem  that was not cancer or not a condition that could lead to cancer, then you no longer need Pap tests. However, even if you no longer need a Pap test, a regular pelvic exam is a good idea to make sure no other problems are developing. Questions to ask your health care provider  Is a hysterectomy medically necessary? Do I have other treatment options for my condition?  What are my options for hysterectomy procedure?  What organs and tissues need to be removed?  What are the risks?  What are the benefits?  How long will I need to stay in the hospital after the procedure?  How long will I need to recover at home?  What symptoms can I expect after the procedure? Summary  A hysterectomy is a surgery in which the uterus is removed. The fallopian tubes and ovaries may be removed (bilateral salpingo-oophorectomy) as well.  This procedure may be done to treat various medical problems. After the procedure, a woman will no longer have menstrual periods nor will she be able to become pregnant.  Discuss the options with your health care provider to determine which type of hysterectomy is the right one for you. This information is not intended to replace advice given to you by your health care provider. Make sure you discuss any questions you have with your health care provider. Document Released: 12/12/2000 Document Revised: 07/25/2016 Document Reviewed: 07/25/2016 Elsevier Interactive Patient Education  2019 [employer].

## 2018-11-18 LAB — C. TRACHOMATIS/N. GONORRHOEAE RNA
C. trachomatis RNA, TMA: NOT DETECTED
N. gonorrhoeae RNA, TMA: NOT DETECTED

## 2018-11-20 ENCOUNTER — Other Ambulatory Visit: Payer: Self-pay | Admitting: Women's Health

## 2018-11-20 DIAGNOSIS — Z30011 Encounter for initial prescription of contraceptive pills: Secondary | ICD-10-CM

## 2018-11-28 ENCOUNTER — Ambulatory Visit: Payer: Self-pay | Admitting: *Deleted

## 2018-11-28 NOTE — Telephone Encounter (Signed)
Patient is calling to report that her son has been exposed over the holiday weekend to someone who has tested + for COVID 19. She does not have symptoms currently- but he does live with her and he has developed a cough.  Advised her to have her son see his PCP/or do virtual visit for testing- home isolation and please call office if she develops any symptoms that could be related to virus.  Reason for Disposition . [1] Caller concerned that exposure to COVID-19 occurred BUT [2] does not meet COVID-19 EXPOSURE criteria from [facility]  Answer Assessment - Initial Assessment Questions 1. CLOSE CONTACT: "Who is the person with the confirmed or suspected COVID-19 infection that you were exposed to?"     Son was exposed to a friend at friend's house 2. PLACE of CONTACT: "Where were you when you were exposed to COVID-19?" (e.g., home, school, medical waiting room; which city?)     Home- son has been exposed to positive person 3. TYPE of CONTACT: "How much contact was there?" (e.g., sitting next to, live in same house, work in same office, same building)     Son- same house over the week end- patient- no direct contact 4. DURATION of CONTACT: "How long were you in contact with the COVID-19 patient?" (e.g., a few seconds, passed by person, a few minutes, live with the patient)     Son lives with mother- she is concerned about her exposure to her son 102. DATE of CONTACT: "When did you have contact with a COVID-19 patient?" (e.g., how many days ago)     Over the past weekend her son was exposed. She states he has developed a cough. 6. TRAVEL: "Have you traveled out of the country recently?" If so, "When and where?"     * Also ask about out-of-state travel, since the CDC has identified some high risk cities for community spread in the [place].     * Note: Travel becomes less relevant if there is widespread community transmission where the patient lives.     no 7. COMMUNITY SPREAD: "Are there lots of cases of COVID-19  (community spread) where you live?" (See public health department website, if unsure)   * MAJOR community spread: high number of cases; numbers of cases are increasing; many people hospitalized.   * MINOR community spread: low number of cases; not increasing; few or no people hospitalized     minor 8. SYMPTOMS: "Do you have any symptoms?" (e.g., fever, cough, breathing difficulty)     no 9. PREGNANCY OR POSTPARTUM: "Is there any chance you are pregnant?" "When was your last menstrual period?" "Did you deliver in the last 2 weeks?"     n/a 10. HIGH RISK: "Do you have any heart or lung problems? Do you have a weak immune system?" (e.g., CHF, COPD, asthma, HIV positive, chemotherapy, renal failure, diabetes mellitus, sickle cell anemia)       High blood pressure  Protocols used: CORONAVIRUS (COVID-19) EXPOSURE-A-AH

## 2018-12-01 ENCOUNTER — Institutional Professional Consult (permissible substitution): Payer: 59 | Admitting: Obstetrics & Gynecology

## 2018-12-11 ENCOUNTER — Other Ambulatory Visit: Payer: Self-pay

## 2018-12-12 ENCOUNTER — Encounter: Payer: Self-pay | Admitting: Obstetrics & Gynecology

## 2018-12-12 ENCOUNTER — Ambulatory Visit: Payer: 59 | Admitting: Obstetrics & Gynecology

## 2018-12-12 VITALS — BP 120/82

## 2018-12-12 DIAGNOSIS — D649 Anemia, unspecified: Secondary | ICD-10-CM | POA: Diagnosis not present

## 2018-12-12 DIAGNOSIS — D219 Benign neoplasm of connective and other soft tissue, unspecified: Secondary | ICD-10-CM

## 2018-12-12 DIAGNOSIS — N920 Excessive and frequent menstruation with regular cycle: Secondary | ICD-10-CM

## 2018-12-12 LAB — CBC
HCT: 40.9 % (ref 35.0–45.0)
Hemoglobin: 13.5 g/dL (ref 11.7–15.5)
MCH: 31.4 pg (ref 27.0–33.0)
MCHC: 33 g/dL (ref 32.0–36.0)
MCV: 95.1 fL (ref 80.0–100.0)
MPV: 10.2 fL (ref 7.5–12.5)
Platelets: 282 10*3/uL (ref 140–400)
RBC: 4.3 10*6/uL (ref 3.80–5.10)
RDW: 13 % (ref 11.0–15.0)
WBC: 5.3 10*3/uL (ref 3.8–10.8)

## 2018-12-12 NOTE — Progress Notes (Signed)
    Samantha Reed [birth year] [id number]        48 y.o.  [id number] Single S/P TL.  RP: Fibroids/Menorrhagia/Anemia x many years  HPI: Improved, but persistent heavy bleeding, now with BTB as well, on the Progestin-only BCPs.  LMP 11/03/2018. No pelvic pain.  Known uterine fibroids.  Normal vaginal secretions.  Abstinent.  Urine/BMs normal.  Cigarette smoker.   OB History  Gravida Para Term Preterm AB Living  5 2     3 2   SAB TAB Ectopic Multiple Live Births               # Outcome Date GA Lbr Len/2nd Weight Sex Delivery Anes PTL Lv  5 AB           4 AB           3 AB           2 Para           1 Para             Past medical history,surgical history, problem list, medications, allergies, family history and social history were all reviewed and documented in the EPIC chart.   Directed ROS with pertinent positives and negatives documented in the history of present illness/assessment and plan.  Exam:  Vitals:   12/12/18 0806  BP: 120/82   General appearance:  Normal  Gynecologic exam deferred   Assessment/Plan:  48 y.o. [id number]   1. Menorrhagia with regular cycle Menorrhagia associated with uterine fibroids now with improved menstrual flow but bleeding more frequently with breakthrough bleeding on the progestin only birth control pill.  Cigarette smoker, therefore estrogen progestin pills contraindicated.  Progestin management reviewed in details with patient, including Depo-Provera injections, progesterone IUD, Nexplanon.  Endometrial ablation also reviewed.  Myomectomy , especially if a submucosal myoma is present.  And hysterectomy with different approaches discussed.  We will proceed with a CBC today to evaluate for anemia.  Patient will follow-up with a pelvic ultrasound to evaluate the fibroids and rule out endometrial pathology.  Management will be further discussed and decided upon at that visit.  Will continue on the progestin only pill at this time. - CBC -  US Transvaginal Non-OB; Future  2. Fibroids Will evaluate size and location of uterine fibroids by pelvic ultrasound. - US Transvaginal Non-OB; Future  3. Secondary anemia Continue on the progestin only pill at this time.  Iron supplement recommended.  Pending CBC. - CBC  Counseling on above issues and coordination of care >50% x 25 minutes.  Princess Bruins MD, 8:14 AM 12/12/2018

## 2018-12-19 ENCOUNTER — Encounter: Payer: Self-pay | Admitting: Obstetrics & Gynecology

## 2018-12-19 NOTE — Patient Instructions (Signed)
1. Menorrhagia with regular cycle Menorrhagia associated with uterine fibroids now with improved menstrual flow but bleeding more frequently with breakthrough bleeding on the progestin only birth control pill.  Cigarette smoker, therefore estrogen progestin pills contraindicated.  Progestin management reviewed in details with patient, including Depo-Provera injections, progesterone IUD, Nexplanon.  Endometrial ablation also reviewed.  Myomectomy , especially if a submucosal myoma is present.  And hysterectomy with different approaches discussed.  We will proceed with a CBC today to evaluate for anemia.  Patient will follow-up with a pelvic ultrasound to evaluate the fibroids and rule out endometrial pathology.  Management will be further discussed and decided upon at that visit.  Will continue on the progestin only pill at this time. - CBC - US Transvaginal Non-OB; Future  2. Fibroids Will evaluate size and location of uterine fibroids by pelvic ultrasound. - US Transvaginal Non-OB; Future  3. Secondary anemia Continue on the progestin only pill at this time.  Iron supplement recommended.  Pending CBC. - CBC  Samantha Reed, it was a pleasure seeing you today!  I will inform you of your results as soon as they are available.

## 2019-01-07 ENCOUNTER — Other Ambulatory Visit: Payer: Self-pay | Admitting: Internal Medicine

## 2019-01-07 DIAGNOSIS — F5101 Primary insomnia: Secondary | ICD-10-CM

## 2019-01-08 ENCOUNTER — Other Ambulatory Visit: Payer: 59

## 2019-01-08 ENCOUNTER — Ambulatory Visit: Payer: 59 | Admitting: Obstetrics & Gynecology

## 2019-02-11 ENCOUNTER — Other Ambulatory Visit: Payer: Self-pay

## 2019-02-12 ENCOUNTER — Ambulatory Visit (INDEPENDENT_AMBULATORY_CARE_PROVIDER_SITE_OTHER): Payer: 59

## 2019-02-12 ENCOUNTER — Ambulatory Visit: Payer: 59 | Admitting: Obstetrics & Gynecology

## 2019-02-12 ENCOUNTER — Other Ambulatory Visit: Payer: Self-pay | Admitting: Obstetrics & Gynecology

## 2019-02-12 DIAGNOSIS — D219 Benign neoplasm of connective and other soft tissue, unspecified: Secondary | ICD-10-CM

## 2019-02-12 DIAGNOSIS — N920 Excessive and frequent menstruation with regular cycle: Secondary | ICD-10-CM

## 2019-02-12 DIAGNOSIS — N945 Secondary dysmenorrhea: Secondary | ICD-10-CM | POA: Diagnosis not present

## 2019-02-12 DIAGNOSIS — R1310 Dysphagia, unspecified: Secondary | ICD-10-CM

## 2019-02-12 DIAGNOSIS — N852 Hypertrophy of uterus: Secondary | ICD-10-CM

## 2019-02-12 NOTE — Progress Notes (Signed)
    Samantha Reed [birth year] [id number]        48 y.o.  [id number]   RP: Menorrhagia associated with large uterine fibroids for Pelvic US  HPI: Menorrhagia associated with large uterine fibroids.  Persistent Menorrhagia on the Progestin-only BCPs x 10/2018.  Abdominopelvic discomfort worsening in the last few months.   OB History  Gravida Para Term Preterm AB Living  5 2     3 2   SAB TAB Ectopic Multiple Live Births               # Outcome Date GA Lbr Len/2nd Weight Sex Delivery Anes PTL Lv  5 AB           4 AB           3 AB           2 Para           1 Para             Past medical history,surgical history, problem list, medications, allergies, family history and social history were all reviewed and documented in the EPIC chart.   Directed ROS with pertinent positives and negatives documented in the history of present illness/assessment and plan.  Exam:  There were no vitals filed for this visit. General appearance:  Normal  Abdominal wall tight from Abdominoplasty.  Uterine height with fibroids at umbilicus.  Pelvic US today: T/A images.  Uterus is enlarged with the significant increase in size since previous pelvic ultrasound April 2018.  The largest diameter in April 2018 was 12.1 cm and it is now 16.77 cm.  Overall uterine size today is measured at 16.77 x 13.41 x 7.3 cm.  At least 12 fibroids are seen the largest measuring 6.2 x 5.3, 4.3 x 4.0, 3.2 x 2.8, and smaller.  The endometrial lining is thin at 3.8 mm, but distorted by fibroids.  Small normal right ovary.  Left ovary normal with a 2.3 cm simple follicle and a 9 mm calcification which are unchanged since previous ultrasound.  No adnexal mass.  No free fluid in the posterior cul-de-sac.   Assessment/Plan:  48 y.o. [id number]   1. Fibroids Counseling on management thoroughly reviewed.  DepoLupron discussed.  Decision to proceed with TAH/Bilateral Salpingectomy.  Patient had an Abdominoplasty in the past, very tight  abdominal wall.  F/U Preop visit.  2. Menorrhagia with regular cycle Persistent even on the Progestin-only BCPs.  3. Secondary dysmenorrhea As above.  4. Pain with swallowing Refer to Gastro.  Counseling on above issues and coordination of care more than 50% for 25 minutes.  Princess Bruins MD, 12:59 PM 02/12/2019

## 2019-02-16 ENCOUNTER — Telehealth: Payer: Self-pay | Admitting: *Deleted

## 2019-02-16 ENCOUNTER — Encounter: Payer: Self-pay | Admitting: Obstetrics & Gynecology

## 2019-02-16 DIAGNOSIS — R1310 Dysphagia, unspecified: Secondary | ICD-10-CM

## 2019-02-16 NOTE — Patient Instructions (Signed)
1. Fibroids Counseling on management thoroughly reviewed.  DepoLupron discussed.  Decision to proceed with TAH/Bilateral Salpingectomy.  Patient had an Abdominoplasty in the past, very tight abdominal wall.  F/U Preop visit.  2. Menorrhagia with regular cycle Persistent even on the Progestin-only BCPs.  3. Secondary dysmenorrhea As above.  4. Pain with swallowing Refer to Gastro.  Samantha Reed, it was a pleasure seeing you today!

## 2019-02-16 NOTE — Telephone Encounter (Signed)
-----   Message from Princess Bruins, MD sent at 02/12/2019 12:54 PM EDT ----- Regarding: Refer to Gastro Difficulty swallowing/feels something when drinking and eating.

## 2019-02-16 NOTE — Telephone Encounter (Signed)
Referral placed at [facility] they will call to schedule. 

## 2019-02-17 ENCOUNTER — Encounter: Payer: Self-pay | Admitting: Gastroenterology

## 2019-02-18 ENCOUNTER — Telehealth: Payer: Self-pay

## 2019-02-18 NOTE — Telephone Encounter (Signed)
Patient scheduled on 03/24/19 with Dr. Rush Landmark

## 2019-02-18 NOTE — Telephone Encounter (Signed)
Spoke with patient about scheduling surgery. I reviewed her insurance benefits with her and her estimated surgery prepymt due. Patient wants to wait until October to have surgery after she sees GI MD on 03/24/19.  I will call her when I have the Oct schedule to arrange.

## 2019-02-26 NOTE — Telephone Encounter (Signed)
I called patient to let her know I have Oct schedule now. Dr. Marguerita Merles is out first week In Oct. But we scheduled her for 04/13/19 at 7:30am at [facility].  I scheduled her Covid screen accordingly and advised her regarding need to quarantine from time of test until surgery only going out for medical visits/med emergency.  Pre op appt with Dr. Marguerita Merles was also scheduled.  Once, again confirmed surgery prepymt amount with her.  Pamphlet from [facility], Covid screen info sheet and financial letter from [facility] sent to patient today.

## 2019-03-13 ENCOUNTER — Encounter: Payer: Self-pay | Admitting: Anesthesiology

## 2019-03-17 DIAGNOSIS — Z0289 Encounter for other administrative examinations: Secondary | ICD-10-CM

## 2019-03-18 ENCOUNTER — Encounter: Payer: Self-pay | Admitting: Anesthesiology

## 2019-03-24 ENCOUNTER — Ambulatory Visit: Payer: 59 | Admitting: Gastroenterology

## 2019-03-31 ENCOUNTER — Other Ambulatory Visit: Payer: Self-pay

## 2019-04-01 ENCOUNTER — Encounter: Payer: Self-pay | Admitting: Obstetrics & Gynecology

## 2019-04-01 ENCOUNTER — Ambulatory Visit: Payer: 59 | Admitting: Obstetrics & Gynecology

## 2019-04-01 VITALS — BP 132/84

## 2019-04-01 DIAGNOSIS — D219 Benign neoplasm of connective and other soft tissue, unspecified: Secondary | ICD-10-CM

## 2019-04-01 DIAGNOSIS — N920 Excessive and frequent menstruation with regular cycle: Secondary | ICD-10-CM

## 2019-04-01 DIAGNOSIS — N945 Secondary dysmenorrhea: Secondary | ICD-10-CM

## 2019-04-01 DIAGNOSIS — D649 Anemia, unspecified: Secondary | ICD-10-CM

## 2019-04-01 NOTE — Progress Notes (Signed)
    Valta Aydt 06-07-1971 [id number]        48 y.o.  [id number] Single S/P TL.  RP: Preop TAH/Bilateral Salpingectomy for symptomatic large Fibroids  HPI: Menorrhagia associated with large uterine fibroids.  Persistent Menorrhagia on the Progestin-only BCPs x 10/2018.  Abdominopelvic discomfort worsening in the last few months.   OB History  Gravida Para Term Preterm AB Living  5 2     3 2   SAB TAB Ectopic Multiple Live Births               # Outcome Date GA Lbr Len/2nd Weight Sex Delivery Anes PTL Lv  5 AB           4 AB           3 AB           2 Para           1 Para             Past medical history,surgical history, problem list, medications, allergies, family history and social history were all reviewed and documented in the EPIC chart.   Directed ROS with pertinent positives and negatives documented in the history of present illness/assessment and plan.  Exam:  Vitals:   04/01/19 1141  BP: 132/84   General appearance:  Normal  Pelvic US 02/12/2019:  T/A images.  Uterus is enlarged with the significant increase in size since previous pelvic ultrasound April 2018.  The largest diameter in April 2018 was 12.1 cm and it is now 16.77 cm.  Overall uterine size today is measured at 16.77 x 13.41 x 7.3 cm.  At least 12 fibroids are seen the largest measuring 6.2 x 5.3, 4.3 x 4.0, 3.2 x 2.8, and smaller.  The endometrial lining is thin at 3.8 mm, but distorted by fibroids.  Small normal right ovary.  Left ovary normal with a 2.3 cm simple follicle and a 9 mm calcification which are unchanged since previous ultrasound.  No adnexal mass.  No free fluid in the posterior cul-de-sac.   Assessment/Plan:  48 y.o. [id number]   1. Fibroids Large symptomatic uterine fibroids with menometrorrhagia and pelvic pain refractory to medical treatment.  The overall size of the uterus has enlarged to 16.77 cm x 13.41 x 7.3 cm.  The endometrial lining was thin but distorted by fibroids.  Decision  made to proceed with total abdominal hysterectomy with bilateral salpingectomy and preservation of ovaries.  Surgery and risks thoroughly reviewed with patient.  Preop management discussed and postop precautions reviewed.  Patient voiced understanding and agreement with plan.  2. Menorrhagia with regular cycle As above.  3. Secondary dysmenorrhea As above.  4. Secondary anemia Iron Sulfate supplement.                          Patient was counseled as to the risk of surgery to include the following:  1. Infection (prohylactic antibiotics will be administered)  2. DVT/Pulmonary Embolism (prophylactic pneumo compression stockings will be used)  3.Trauma to internal organs requiring additional surgical procedure to repair any injury to internal organs requiring perhaps additional hospitalization days.  4.Hemmorhage requiring transfusion and blood products which carry risks such as anaphylactic reaction, hepatitis and AIDS  Patient had received literature information on the procedure scheduled and all her questions were answered and fully accepts all risk.   Princess Bruins MD, 12:10 PM 04/01/2019

## 2019-04-01 NOTE — Patient Instructions (Signed)
1. Fibroids Large symptomatic uterine fibroids with menometrorrhagia and pelvic pain refractory to medical treatment.  The overall size of the uterus has enlarged to 16.77 cm x 13.41 x 7.3 cm.  The endometrial lining was thin but distorted by fibroids.  Decision made to proceed with total abdominal hysterectomy with bilateral salpingectomy and preservation of ovaries.  Surgery and risks thoroughly reviewed with patient.  Preop management discussed and postop precautions reviewed.  Patient voiced understanding and agreement with plan.  2. Menorrhagia with regular cycle As above.  3. Secondary dysmenorrhea As above.  4. Secondary anemia Iron Sulfate supplement.                          Patient was counseled as to the risk of surgery to include the following:  1. Infection (prohylactic antibiotics will be administered)  2. DVT/Pulmonary Embolism (prophylactic pneumo compression stockings will be used)  3.Trauma to internal organs requiring additional surgical procedure to repair any injury to internal organs requiring perhaps additional hospitalization days.  4.Hemmorhage requiring transfusion and blood products which carry risks such as anaphylactic reaction, hepatitis and AIDS  Patient had received literature information on the procedure scheduled and all her questions were answered and fully accepts all risk.  Kadelyn, it was a pleasure seeing you today!

## 2019-04-06 NOTE — Patient Instructions (Addendum)
YOU ARE SCHEDULED FOR A COVID TEST _________@____________ . THIS TEST MUST BE DONE BEFORE SURGERY. GO TO  [address], [place], [address] AND REMAIN IN YOUR CAR, THIS IS A DRIVE UP TEST. ONCE YOUR COVID TEST IS DONE PLEASE FOLLOW ALL THE QUARANTINE  INSTRUCTIONS GIVEN IN YOUR HANDOUT.      Your procedure is scheduled on 04-13-19   Report to [facility]. M.   Call this number if you have problems the morning of surgery  :[phone].   OUR ADDRESS IS [place].  WE ARE LOCATED IN [facility].                                     REMEMBER:  DO NOT EAT FOOD OR DRINK LIQUIDS AFTER MIDNIGHT .  YOU MAY  BRUSH YOUR TEETH MORNING OF SURGERY AND RINSE YOUR MOUTH OUT, NO CHEWING GUM CANDY OR MINTS.   TAKE THESE MEDICATIONS MORNING OF SURGERY WITH A SIP OF WATER:  ____crestor______________________________  IF YOU ARE SPENDING THE NIGHT AFTER SURGERY PLEASE BRING ALL YOUR PRESCRIPTION MEDICATIONS IN THEIR ORIGINAL BOTTLES. 1 VISITOR IS ALLOWED IN WAITING ROOM ONLY DAY OF SURGERY. NO VISITOR MAY SPEND THE NIGHT. VISITOR ARE ALLOWED TO STAY UNTIL 800 PM.                                    DO NOT WEAR JEWERLY, MAKE UP, OR NAIL POLISH ON FINGERNAILS. DO NOT WEAR LOTIONS, POWDERS, PERFUMES OR DEODORANT. DO NOT SHAVE FOR 24 HOURS PRIOR TO DAY OF SURGERY.  CONTACTS, GLASSES, OR DENTURES MAY NOT BE WORN TO SURGERY.                                    [facility] IS NOT RESPONSIBLE  FOR ANY BELONGINGS.                                        [facility] - Preparing for Surgery Before surgery, you can play an important role.  Because skin is not sterile, your skin needs to be as free of germs as possible.  You can reduce the number of germs on your skin by washing with CHG (chlorahexidine gluconate) soap before surgery.  CHG is an antiseptic cleaner which kills germs and bonds with the skin to continue killing germs even after  washing. Please DO NOT use if you have an allergy to CHG or antibacterial soaps.  If your skin becomes reddened/irritated stop using the CHG and inform your nurse when you arrive at Short Stay. Do not shave (including legs and underarms) for at least 48 hours prior to the first CHG shower.  You may shave your face/neck. Please follow these instructions carefully:  1.  Shower with CHG Soap the night before surgery and the  morning of Surgery.  2.  If you choose to wash your hair, wash your hair first as usual with your  normal  shampoo.  3.  After you shampoo, rinse your hair and body thoroughly to remove the  shampoo.  4.  Use CHG as you would any other liquid soap.  You can apply chg directly  to the skin and wash                       Gently with a scrungie or clean washcloth.  5.  Apply the CHG Soap to your body ONLY FROM THE NECK DOWN.   Do not use on face/ open                           Wound or open sores. Avoid contact with eyes, ears mouth and genitals (private parts).                       Wash face,  Genitals (private parts) with your normal soap.             6.  Wash thoroughly, paying special attention to the area where your surgery  will be performed.  7.  Thoroughly rinse your body with warm water from the neck down.  8.  DO NOT shower/wash with your normal soap after using and rinsing off  the CHG Soap.                9.  Pat yourself dry with a clean towel.            10.  Wear clean pajamas.            11.  Place clean sheets on your bed the night of your first shower and do not  sleep with pets. Day of Surgery : Do not apply any lotions/deodorants the morning of surgery.  Please wear clean clothes to the hospital/surgery center.  FAILURE TO FOLLOW THESE INSTRUCTIONS MAY RESULT IN THE CANCELLATION OF YOUR SURGERY PATIENT SIGNATURE_________________________________  NURSE  SIGNATURE__________________________________  ________________________________________________________________________   Adam Phenix  An incentive spirometer is a tool that can help keep your lungs clear and active. This tool measures how well you are filling your lungs with each breath. Taking long deep breaths may help reverse or decrease the chance of developing breathing (pulmonary) problems (especially infection) following:  A long period of time when you are unable to move or be active. BEFORE THE PROCEDURE   If the spirometer includes an indicator to show your best effort, your nurse or respiratory therapist will set it to a desired goal.  If possible, sit up straight or lean slightly forward. Try not to slouch.  Hold the incentive spirometer in an upright position. INSTRUCTIONS FOR USE  1. Sit on the edge of your bed if possible, or sit up as far as you can in bed or on a chair. 2. Hold the incentive spirometer in an upright position. 3. Breathe out normally. 4. Place the mouthpiece in your mouth and seal your lips tightly around it. 5. Breathe in slowly and as deeply as possible, raising the piston or the ball toward the top of the column. 6. Hold your breath for 3-5 seconds or for as long as possible. Allow the piston or ball to fall to the bottom of the column. 7. Remove the mouthpiece from your mouth and breathe out normally. 8. Rest for a few seconds and repeat Steps 1 through 7 at least 10 times every 1-2 hours when you are awake. Take your time and take a few normal breaths between deep breaths. 9. The spirometer may include an indicator to show  your best effort. Use the indicator as a goal to work toward during each repetition. 10. After each set of 10 deep breaths, practice coughing to be sure your lungs are clear. If you have an incision (the cut made at the time of surgery), support your incision when coughing by placing a pillow or rolled up towels firmly  against it. Once you are able to get out of bed, walk around indoors and cough well. You may stop using the incentive spirometer when instructed by your caregiver.  RISKS AND COMPLICATIONS  Take your time so you do not get dizzy or light-headed.  If you are in pain, you may need to take or ask for pain medication before doing incentive spirometry. It is harder to take a deep breath if you are having pain. AFTER USE  Rest and breathe slowly and easily.  It can be helpful to keep track of a log of your progress. Your caregiver can provide you with a simple table to help with this. If you are using the spirometer at home, follow these instructions: [place] IF:   You are having difficultly using the spirometer.  You have trouble using the spirometer as often as instructed.  Your pain medication is not giving enough relief while using the spirometer.  You develop fever of 100.5 F (38.1 C) or higher. SEEK IMMEDIATE MEDICAL CARE IF:   You cough up bloody sputum that had not been present before.  You develop fever of 102 F (38.9 C) or greater.  You develop worsening pain at or near the incision site. MAKE SURE YOU:   Understand these instructions.  Will watch your condition.  Will get help right away if you are not doing well or get worse. Document Released: 10/29/2006 Document Revised: 09/10/2011 Document Reviewed: 12/30/2006 Exit[employer] Patient Information 2014 Exit[employer], [place].   ________________________________________________________________________  WHAT IS A BLOOD TRANSFUSION? Blood Transfusion Information  A transfusion is the replacement of blood or some of its parts. Blood is made up of multiple cells which provide different functions.  Red blood cells carry oxygen and are used for blood loss replacement.  White blood cells fight against infection.  Platelets control bleeding.  Plasma helps clot blood.  Other blood products are available for  specialized needs, such as hemophilia or other clotting disorders. BEFORE THE TRANSFUSION  Who gives blood for transfusions?   Healthy volunteers who are fully evaluated to make sure their blood is safe. This is blood bank blood. Transfusion therapy is the safest it has ever been in the practice of medicine. Before blood is taken from a donor, a complete history is taken to make sure that person has no history of diseases nor engages in risky social behavior (examples are intravenous drug use or sexual activity with multiple partners). The donor's travel history is screened to minimize risk of transmitting infections, such as malaria. The donated blood is tested for signs of infectious diseases, such as HIV and hepatitis. The blood is then tested to be sure it is compatible with you in order to minimize the chance of a transfusion reaction. If you or a relative donates blood, this is often done in anticipation of surgery and is not appropriate for emergency situations. It takes many days to process the donated blood. RISKS AND COMPLICATIONS Although transfusion therapy is very safe and saves many lives, the main dangers of transfusion include:   Getting an infectious disease.  Developing a transfusion reaction. This is an allergic reaction to  something in the blood you were given. Every precaution is taken to prevent this. The decision to have a blood transfusion has been considered carefully by your caregiver before blood is given. Blood is not given unless the benefits outweigh the risks. AFTER THE TRANSFUSION  Right after receiving a blood transfusion, you will usually feel much better and more energetic. This is especially true if your red blood cells have gotten low (anemic). The transfusion raises the level of the red blood cells which carry oxygen, and this usually causes an energy increase.  The nurse administering the transfusion will monitor you carefully for complications. HOME CARE  INSTRUCTIONS  No special instructions are needed after a transfusion. You may find your energy is better. Speak with your caregiver about any limitations on activity for underlying diseases you may have. SEEK MEDICAL CARE IF:   Your condition is not improving after your transfusion.  You develop redness or irritation at the intravenous (IV) site. SEEK IMMEDIATE MEDICAL CARE IF:  Any of the following symptoms occur over the next 12 hours:  Shaking chills.  You have a temperature by mouth above 102 F (38.9 C), not controlled by medicine.  Chest, back, or muscle pain.  People around you feel you are not acting correctly or are confused.  Shortness of breath or difficulty breathing.  Dizziness and fainting.  You get a rash or develop hives.  You have a decrease in urine output.  Your urine turns a dark color or changes to pink, red, or brown. Any of the following symptoms occur over the next 10 days:  You have a temperature by mouth above 102 F (38.9 C), not controlled by medicine.  Shortness of breath.  Weakness after normal activity.  The white part of the eye turns yellow (jaundice).  You have a decrease in the amount of urine or are urinating less often.  Your urine turns a dark color or changes to pink, red, or brown. Document Released: 06/15/2000 Document Revised: 09/10/2011 Document Reviewed: 02/02/2008 [facility] Patient Information 2014 [place], [place].  _______________________________________________________________________                             .

## 2019-04-08 ENCOUNTER — Encounter (HOSPITAL_COMMUNITY)
Admission: RE | Admit: 2019-04-08 | Discharge: 2019-04-08 | Disposition: A | Discharge status: Home / Self Care | Payer: 59 | Source: Ambulatory Visit | Attending: Obstetrics & Gynecology | Admitting: Obstetrics & Gynecology

## 2019-04-08 ENCOUNTER — Telehealth: Payer: Self-pay

## 2019-04-08 ENCOUNTER — Encounter (HOSPITAL_COMMUNITY): Payer: Self-pay

## 2019-04-08 ENCOUNTER — Other Ambulatory Visit: Payer: Self-pay

## 2019-04-08 DIAGNOSIS — N920 Excessive and frequent menstruation with regular cycle: Secondary | ICD-10-CM | POA: Insufficient documentation

## 2019-04-08 DIAGNOSIS — D259 Leiomyoma of uterus, unspecified: Secondary | ICD-10-CM | POA: Diagnosis not present

## 2019-04-08 DIAGNOSIS — R52 Pain, unspecified: Secondary | ICD-10-CM | POA: Insufficient documentation

## 2019-04-08 DIAGNOSIS — R9431 Abnormal electrocardiogram [ECG] [EKG]: Secondary | ICD-10-CM | POA: Insufficient documentation

## 2019-04-08 DIAGNOSIS — Z01818 Encounter for other preprocedural examination: Secondary | ICD-10-CM | POA: Insufficient documentation

## 2019-04-08 HISTORY — DX: Anemia, unspecified: D64.9

## 2019-04-08 HISTORY — DX: Sleep apnea, unspecified: G47.30

## 2019-04-08 LAB — BASIC METABOLIC PANEL
Anion gap: 9 (ref 5–15)
BUN: 9 mg/dL (ref 6–20)
CO2: 27 mmol/L (ref 22–32)
Calcium: 9.6 mg/dL (ref 8.9–10.3)
Chloride: 106 mmol/L (ref 98–111)
Creatinine, Ser: 0.94 mg/dL (ref 0.44–1.00)
GFR calc Af Amer: >60 mL/min (ref >60)
GFR calc non Af Amer: >60 mL/min (ref >60)
Glucose, Bld: 96 mg/dL (ref 70–99)
Potassium: 3.9 mmol/L (ref 3.5–5.1)
Sodium: 142 mmol/L (ref 135–145)

## 2019-04-08 LAB — CBC
HCT: 41.6 % (ref 36.0–46.0)
Hemoglobin: 13.6 g/dL (ref 12.0–15.0)
MCH: 31.1 pg (ref 26.0–34.0)
MCHC: 32.7 g/dL (ref 30.0–36.0)
MCV: 95.2 fL (ref 80.0–100.0)
Platelets: 291 10*3/uL (ref 150–400)
RBC: 4.37 MIL/uL (ref 3.87–5.11)
RDW: 12.3 % (ref 11.5–15.5)
WBC: 6 10*3/uL (ref 4.0–10.5)
nRBC: 0 % (ref 0.0–0.2)

## 2019-04-08 LAB — ABO/RH: ABO/RH(D): B POS

## 2019-04-08 NOTE — Progress Notes (Signed)
PCP - Scarlette Calico ]Cardiologist -   Chest x-ray -  EKG -  Stress Test -  ECHO -  Cardiac Cath -   Sleep Study - OSA mild per pt CPAP - no  Fasting Blood Sugar -  Checks Blood Sugar _____ times a day  Blood Thinner Instructions: Aspirin Instructions: Last Dose:  Anesthesia review: abn ekg  Patient denies shortness of breath, fever, cough and chest pain at PAT appointment  none   Patient verbalized understanding of instructions that were given to them at the PAT appointment. Patient was also instructed that they will need to review over the PAT instructions again at home before surgery.

## 2019-04-08 NOTE — Telephone Encounter (Signed)
I received a call from [facility] at [facility] that patient did not sign consent because she had questions and did not think procedure correct.  I called and spoke with the patient and she thought "total" abd hyst meant that Dr. Dellis Filbert "was taking everything out". I explained the terms and that Bilateral Salpingectomy was just the fallopian tubes and her ovaries would be preserved and not removed per office note.  She was fine with all this.  I called Misty back. She said patient can sign consent morning of surgery.

## 2019-04-09 ENCOUNTER — Other Ambulatory Visit (HOSPITAL_COMMUNITY)
Admission: RE | Admit: 2019-04-09 | Discharge: 2019-04-09 | Disposition: A | Discharge status: Home / Self Care | Payer: 59 | Source: Ambulatory Visit | Attending: Obstetrics & Gynecology | Admitting: Obstetrics & Gynecology

## 2019-04-09 DIAGNOSIS — Z01812 Encounter for preprocedural laboratory examination: Secondary | ICD-10-CM | POA: Insufficient documentation

## 2019-04-09 DIAGNOSIS — Z20828 Contact with and (suspected) exposure to other viral communicable diseases: Secondary | ICD-10-CM | POA: Diagnosis not present

## 2019-04-10 LAB — NOVEL CORONAVIRUS, NAA (HOSP ORDER, SEND-OUT TO REF LAB; TAT 18-24 HRS): SARS-CoV-2, NAA: NOT DETECTED

## 2019-04-12 ENCOUNTER — Telehealth: Payer: Self-pay | Admitting: Obstetrics and Gynecology

## 2019-04-12 NOTE — Telephone Encounter (Signed)
Phone call from patient after hours.   She is scheduled for hysterectomy tomorrow for fibroids and is not not having her usual symptoms.   She has been off her OCPs and is having hot flashes.   She is in doubt if she should proceed or not.   I told her I would reach out to her primary gynecologist to have her have a personal conservation and make a good decision for her care.   ADDENDUM - Dr. Dellis Filbert reached and will call the patient back.

## 2019-04-12 NOTE — Anesthesia Preprocedure Evaluation (Addendum)
Anesthesia Evaluation  Patient identified by MRN, date of birth, ID band Patient awake    Reviewed: Allergy & Precautions, NPO status , Patient's Chart, lab work & pertinent test results  History of Anesthesia Complications Negative for: history of anesthetic complications  Airway Mallampati: I  TM Distance: >3 FB Neck ROM: Full    Dental  (+) Dental Advisory Given, Teeth Intact   Pulmonary sleep apnea , Current SmokerPatient did not abstain from smoking.,    Pulmonary exam normal        Cardiovascular hypertension, Pt. on medications Normal cardiovascular exam     Neuro/Psych negative neurological ROS  negative psych ROS   GI/Hepatic negative GI ROS, Neg liver ROS,   Endo/Other  negative endocrine ROS  Renal/GU negative Renal ROS     Musculoskeletal negative musculoskeletal ROS (+)   Abdominal   Peds  Hematology negative hematology ROS (+)   Anesthesia Other Findings   Reproductive/Obstetrics  Fibroids S/p tubal ligation                             Anesthesia Physical Anesthesia Plan  ASA: II  Anesthesia Plan: General   Post-op Pain Management:    Induction: Intravenous  PONV Risk Score and Plan: 4 or greater and Treatment may vary due to age or medical condition, Ondansetron, Dexamethasone, Midazolam and Scopolamine patch - Pre-op  Airway Management Planned: Oral ETT  Additional Equipment: None  Intra-op Plan:   Post-operative Plan: Extubation in OR  Informed Consent: I have reviewed the patients History and Physical, chart, labs and discussed the procedure including the risks, benefits and alternatives for the proposed anesthesia with the patient or authorized representative who has indicated his/her understanding and acceptance.     Dental advisory given  Plan Discussed with: CRNA and Anesthesiologist  Anesthesia Plan Comments:        Anesthesia Quick  Evaluation

## 2019-04-13 ENCOUNTER — Observation Stay (HOSPITAL_BASED_OUTPATIENT_CLINIC_OR_DEPARTMENT_OTHER)
Admission: RE | Admit: 2019-04-13 | Discharge: 2019-04-14 | DRG: 743 | Disposition: A | Discharge status: Home / Self Care | Payer: 59 | Attending: Obstetrics & Gynecology | Admitting: Obstetrics & Gynecology

## 2019-04-13 ENCOUNTER — Ambulatory Visit (HOSPITAL_BASED_OUTPATIENT_CLINIC_OR_DEPARTMENT_OTHER): Payer: 59 | Admitting: Anesthesiology

## 2019-04-13 ENCOUNTER — Encounter (HOSPITAL_BASED_OUTPATIENT_CLINIC_OR_DEPARTMENT_OTHER)
Admission: RE | Disposition: A | Discharge status: Home / Self Care | Payer: Self-pay | Source: Home / Self Care | Attending: Obstetrics & Gynecology

## 2019-04-13 ENCOUNTER — Encounter (HOSPITAL_BASED_OUTPATIENT_CLINIC_OR_DEPARTMENT_OTHER): Payer: Self-pay | Admitting: *Deleted

## 2019-04-13 ENCOUNTER — Other Ambulatory Visit: Payer: Self-pay

## 2019-04-13 ENCOUNTER — Ambulatory Visit (HOSPITAL_BASED_OUTPATIENT_CLINIC_OR_DEPARTMENT_OTHER): Payer: 59 | Admitting: Physician Assistant

## 2019-04-13 DIAGNOSIS — Z8249 Family history of ischemic heart disease and other diseases of the circulatory system: Secondary | ICD-10-CM | POA: Diagnosis not present

## 2019-04-13 DIAGNOSIS — I10 Essential (primary) hypertension: Secondary | ICD-10-CM | POA: Diagnosis present

## 2019-04-13 DIAGNOSIS — D259 Leiomyoma of uterus, unspecified: Secondary | ICD-10-CM | POA: Diagnosis not present

## 2019-04-13 DIAGNOSIS — F17210 Nicotine dependence, cigarettes, uncomplicated: Secondary | ICD-10-CM | POA: Diagnosis present

## 2019-04-13 DIAGNOSIS — Z823 Family history of stroke: Secondary | ICD-10-CM

## 2019-04-13 DIAGNOSIS — Z9889 Other specified postprocedural states: Secondary | ICD-10-CM

## 2019-04-13 DIAGNOSIS — D649 Anemia, unspecified: Secondary | ICD-10-CM

## 2019-04-13 DIAGNOSIS — Z811 Family history of alcohol abuse and dependence: Secondary | ICD-10-CM

## 2019-04-13 DIAGNOSIS — Z8349 Family history of other endocrine, nutritional and metabolic diseases: Secondary | ICD-10-CM | POA: Diagnosis not present

## 2019-04-13 DIAGNOSIS — E7800 Pure hypercholesterolemia, unspecified: Secondary | ICD-10-CM | POA: Diagnosis present

## 2019-04-13 DIAGNOSIS — N920 Excessive and frequent menstruation with regular cycle: Secondary | ICD-10-CM | POA: Diagnosis present

## 2019-04-13 DIAGNOSIS — G4730 Sleep apnea, unspecified: Secondary | ICD-10-CM | POA: Diagnosis not present

## 2019-04-13 DIAGNOSIS — N921 Excessive and frequent menstruation with irregular cycle: Secondary | ICD-10-CM | POA: Diagnosis present

## 2019-04-13 DIAGNOSIS — R102 Pelvic and perineal pain: Secondary | ICD-10-CM | POA: Diagnosis not present

## 2019-04-13 DIAGNOSIS — Z885 Allergy status to narcotic agent status: Secondary | ICD-10-CM

## 2019-04-13 DIAGNOSIS — D219 Benign neoplasm of connective and other soft tissue, unspecified: Secondary | ICD-10-CM | POA: Diagnosis present

## 2019-04-13 DIAGNOSIS — D252 Subserosal leiomyoma of uterus: Secondary | ICD-10-CM

## 2019-04-13 DIAGNOSIS — N945 Secondary dysmenorrhea: Secondary | ICD-10-CM

## 2019-04-13 DIAGNOSIS — N946 Dysmenorrhea, unspecified: Secondary | ICD-10-CM | POA: Diagnosis present

## 2019-04-13 DIAGNOSIS — D250 Submucous leiomyoma of uterus: Secondary | ICD-10-CM

## 2019-04-13 HISTORY — PX: HYSTERECTOMY ABDOMINAL WITH SALPINGECTOMY: SHX6725

## 2019-04-13 LAB — POCT PREGNANCY, URINE: Preg Test, Ur: NEGATIVE

## 2019-04-13 LAB — TYPE AND SCREEN
ABO/RH(D): B POS
Antibody Screen: NEGATIVE

## 2019-04-13 SURGERY — HYSTERECTOMY, TOTAL, ABDOMINAL, WITH SALPINGECTOMY
Anesthesia: General | Site: Abdomen | Laterality: Bilateral

## 2019-04-13 MED ORDER — ACETAMINOPHEN 325 MG PO TABS
ORAL_TABLET | ORAL | Status: AC
  Filled 2019-04-13: qty 2

## 2019-04-13 MED ORDER — FENTANYL CITRATE (PF) 100 MCG/2ML IJ SOLN
25.0000 ug | INTRAMUSCULAR | Status: DC | PRN
  Administered 2019-04-13: 50 ug via INTRAVENOUS
  Administered 2019-04-13: 25 ug via INTRAVENOUS
  Filled 2019-04-13: qty 1

## 2019-04-13 MED ORDER — HYDROMORPHONE HCL 1 MG/ML IJ SOLN
0.5000 mg | INTRAMUSCULAR | Status: DC | PRN
  Filled 2019-04-13: qty 0.5

## 2019-04-13 MED ORDER — FENTANYL CITRATE (PF) 250 MCG/5ML IJ SOLN
INTRAMUSCULAR | Status: AC
  Filled 2019-04-13: qty 5

## 2019-04-13 MED ORDER — ACETAMINOPHEN 325 MG PO TABS
650.0000 mg | ORAL_TABLET | ORAL | Status: DC | PRN
  Administered 2019-04-13: 650 mg via ORAL
  Filled 2019-04-13: qty 2

## 2019-04-13 MED ORDER — SODIUM CHLORIDE (PF) 0.9 % IJ SOLN
INTRAMUSCULAR | Status: AC
  Filled 2019-04-13: qty 50

## 2019-04-13 MED ORDER — PROPOFOL 10 MG/ML IV BOLUS
INTRAVENOUS | Status: AC
  Filled 2019-04-13: qty 40

## 2019-04-13 MED ORDER — BUPIVACAINE HCL (PF) 0.25 % IJ SOLN
INTRAMUSCULAR | Status: DC | PRN
  Administered 2019-04-13: 10 mL

## 2019-04-13 MED ORDER — LIDOCAINE 2% (20 MG/ML) 5 ML SYRINGE
INTRAMUSCULAR | Status: AC
  Filled 2019-04-13: qty 5

## 2019-04-13 MED ORDER — LACTATED RINGERS IV SOLN
INTRAVENOUS | Status: DC
  Administered 2019-04-13: 07:00:00 via INTRAVENOUS
  Filled 2019-04-13: qty 1000

## 2019-04-13 MED ORDER — OXYCODONE HCL 5 MG PO TABS
ORAL_TABLET | ORAL | Status: AC
  Filled 2019-04-13: qty 1

## 2019-04-13 MED ORDER — CEFAZOLIN SODIUM-DEXTROSE 2-4 GM/100ML-% IV SOLN
2.0000 g | INTRAVENOUS | Status: AC
  Administered 2019-04-13: 2 g via INTRAVENOUS
  Filled 2019-04-13: qty 100

## 2019-04-13 MED ORDER — MIDAZOLAM HCL 2 MG/2ML IJ SOLN
INTRAMUSCULAR | Status: AC
  Filled 2019-04-13: qty 2

## 2019-04-13 MED ORDER — ACETAMINOPHEN 500 MG PO TABS
ORAL_TABLET | ORAL | Status: AC
  Filled 2019-04-13: qty 2

## 2019-04-13 MED ORDER — ONDANSETRON HCL 4 MG/2ML IJ SOLN
INTRAMUSCULAR | Status: DC | PRN
  Administered 2019-04-13: 4 mg via INTRAVENOUS

## 2019-04-13 MED ORDER — BUPIVACAINE LIPOSOME 1.3 % IJ SUSP
INTRAMUSCULAR | Status: DC | PRN
  Administered 2019-04-13: 20 mL

## 2019-04-13 MED ORDER — FENTANYL CITRATE (PF) 100 MCG/2ML IJ SOLN
INTRAMUSCULAR | Status: DC | PRN
  Administered 2019-04-13: 100 ug via INTRAVENOUS
  Administered 2019-04-13 (×2): 25 ug via INTRAVENOUS
  Administered 2019-04-13: 50 ug via INTRAVENOUS

## 2019-04-13 MED ORDER — FENTANYL CITRATE (PF) 100 MCG/2ML IJ SOLN
INTRAMUSCULAR | Status: AC
  Filled 2019-04-13: qty 2

## 2019-04-13 MED ORDER — SUGAMMADEX SODIUM 200 MG/2ML IV SOLN
INTRAVENOUS | Status: DC | PRN
  Administered 2019-04-13: 150 mg via INTRAVENOUS

## 2019-04-13 MED ORDER — ROCURONIUM BROMIDE 100 MG/10ML IV SOLN
INTRAVENOUS | Status: DC | PRN
  Administered 2019-04-13: 50 mg via INTRAVENOUS

## 2019-04-13 MED ORDER — OXYCODONE HCL 5 MG/5ML PO SOLN
5.0000 mg | Freq: Once | ORAL | Status: DC | PRN
  Filled 2019-04-13: qty 5

## 2019-04-13 MED ORDER — OXYCODONE HCL 5 MG PO TABS
5.0000 mg | ORAL_TABLET | ORAL | Status: DC | PRN
  Administered 2019-04-13 – 2019-04-14 (×4): 5 mg via ORAL
  Filled 2019-04-13: qty 2

## 2019-04-13 MED ORDER — BUPIVACAINE HCL (PF) 0.25 % IJ SOLN
INTRAMUSCULAR | Status: AC
  Filled 2019-04-13: qty 30

## 2019-04-13 MED ORDER — PROPOFOL 10 MG/ML IV BOLUS
INTRAVENOUS | Status: DC | PRN
  Administered 2019-04-13: 200 mg via INTRAVENOUS
  Administered 2019-04-13: 50 mg via INTRAVENOUS

## 2019-04-13 MED ORDER — CEFAZOLIN SODIUM-DEXTROSE 2-4 GM/100ML-% IV SOLN
INTRAVENOUS | Status: AC
  Filled 2019-04-13: qty 100

## 2019-04-13 MED ORDER — ACETAMINOPHEN 500 MG PO TABS
1000.0000 mg | ORAL_TABLET | ORAL | Status: AC
  Administered 2019-04-13: 1000 mg via ORAL
  Filled 2019-04-13: qty 2

## 2019-04-13 MED ORDER — DEXAMETHASONE SODIUM PHOSPHATE 10 MG/ML IJ SOLN
INTRAMUSCULAR | Status: AC
  Filled 2019-04-13: qty 1

## 2019-04-13 MED ORDER — LIDOCAINE HCL (CARDIAC) PF 100 MG/5ML IV SOSY
PREFILLED_SYRINGE | INTRAVENOUS | Status: DC | PRN
  Administered 2019-04-13: 100 mg via INTRAVENOUS

## 2019-04-13 MED ORDER — MIDAZOLAM HCL 5 MG/5ML IJ SOLN
INTRAMUSCULAR | Status: DC | PRN
  Administered 2019-04-13: 2 mg via INTRAVENOUS

## 2019-04-13 MED ORDER — LACTATED RINGERS IV SOLN
INTRAVENOUS | Status: DC
  Administered 2019-04-13: 125 mL/h via INTRAVENOUS
  Filled 2019-04-13 (×2): qty 1000

## 2019-04-13 MED ORDER — SODIUM CHLORIDE (PF) 0.9 % IJ SOLN
INTRAMUSCULAR | Status: DC | PRN
  Administered 2019-04-13: 10 mL

## 2019-04-13 MED ORDER — BUPIVACAINE LIPOSOME 1.3 % IJ SUSP
INTRAMUSCULAR | Status: AC
  Filled 2019-04-13: qty 20

## 2019-04-13 MED ORDER — PROMETHAZINE HCL 25 MG/ML IJ SOLN
6.2500 mg | INTRAMUSCULAR | Status: DC | PRN
  Filled 2019-04-13: qty 1

## 2019-04-13 MED ORDER — ROCURONIUM BROMIDE 10 MG/ML (PF) SYRINGE
PREFILLED_SYRINGE | INTRAVENOUS | Status: AC
  Filled 2019-04-13: qty 10

## 2019-04-13 MED ORDER — DEXAMETHASONE SODIUM PHOSPHATE 4 MG/ML IJ SOLN
INTRAMUSCULAR | Status: DC | PRN
  Administered 2019-04-13: 10 mg via INTRAVENOUS

## 2019-04-13 MED ORDER — KETOROLAC TROMETHAMINE 30 MG/ML IJ SOLN
INTRAMUSCULAR | Status: DC | PRN
  Administered 2019-04-13: 30 mg via INTRAVENOUS

## 2019-04-13 MED ORDER — KETOROLAC TROMETHAMINE 30 MG/ML IJ SOLN
INTRAMUSCULAR | Status: AC
  Filled 2019-04-13: qty 1

## 2019-04-13 MED ORDER — ONDANSETRON HCL 4 MG/2ML IJ SOLN
INTRAMUSCULAR | Status: AC
  Filled 2019-04-13: qty 2

## 2019-04-13 MED ORDER — OXYCODONE HCL 5 MG PO TABS
5.0000 mg | ORAL_TABLET | Freq: Once | ORAL | Status: DC | PRN
  Filled 2019-04-13: qty 1

## 2019-04-13 SURGICAL SUPPLY — 36 items
CANISTER SUCT 3000ML PPV (MISCELLANEOUS) ×2 IMPLANT
DECANTER SPIKE VIAL GLASS SM (MISCELLANEOUS) ×4 IMPLANT
DERMABOND ADVANCED (GAUZE/BANDAGES/DRESSINGS) ×1
DERMABOND ADVANCED .7 DNX12 (GAUZE/BANDAGES/DRESSINGS) ×1 IMPLANT
DRAPE LAPAROTOMY T 102X78X121 (DRAPES) ×2 IMPLANT
DRAPE WARM FLUID 44X44 (DRAPES) IMPLANT
DRSG OPSITE 6X11 MED (GAUZE/BANDAGES/DRESSINGS) IMPLANT
DRSG OPSITE POSTOP 4X10 (GAUZE/BANDAGES/DRESSINGS) ×2 IMPLANT
DURAPREP 26ML APPLICATOR (WOUND CARE) ×2 IMPLANT
ELECT BLADE TIP CTD 4 INCH (ELECTRODE) ×2 IMPLANT
GAUZE 4X4 16PLY RFD (DISPOSABLE) ×2 IMPLANT
GLOVE BIO SURGEON STRL SZ 6.5 (GLOVE) ×2 IMPLANT
GLOVE BIO SURGEON STRL SZ7.5 (GLOVE) ×2 IMPLANT
GLOVE BIOGEL PI IND STRL 7.0 (GLOVE) ×3 IMPLANT
GLOVE BIOGEL PI INDICATOR 7.0 (GLOVE) ×3
GOWN STRL REUS W/TWL LRG LVL3 (GOWN DISPOSABLE) ×6 IMPLANT
HEMOSTAT ARISTA ABSORB 3G PWDR (HEMOSTASIS) ×2 IMPLANT
HIBICLENS CHG 4% 4OZ BTL (MISCELLANEOUS) ×2 IMPLANT
NEEDLE HYPO 22GX1.5 SAFETY (NEEDLE) ×4 IMPLANT
PACK ABDOMINAL GYN (CUSTOM PROCEDURE TRAY) ×2 IMPLANT
PAD OB MATERNITY 4.3X12.25 (PERSONAL CARE ITEMS) ×2 IMPLANT
PROTECTOR NERVE ULNAR (MISCELLANEOUS) ×2 IMPLANT
SPONGE LAP 18X18 RF (DISPOSABLE) ×4 IMPLANT
SUT PLAIN 3 0 CT 1 27 (SUTURE) IMPLANT
SUT PROLENE 3 0 SH DA (SUTURE) IMPLANT
SUT VIC AB 0 CT1 18XCR BRD8 (SUTURE) ×2 IMPLANT
SUT VIC AB 0 CT1 27 (SUTURE) ×4
SUT VIC AB 0 CT1 27XBRD ANBCTR (SUTURE) ×4 IMPLANT
SUT VIC AB 0 CT1 8-18 (SUTURE) ×2
SUT VIC AB 3-0 SH 27 (SUTURE) ×1
SUT VIC AB 3-0 SH 27X BRD (SUTURE) ×1 IMPLANT
SUT VICRYL 0 TIES 12 18 (SUTURE) ×2 IMPLANT
SUT VICRYL 4-0 PS2 18IN ABS (SUTURE) ×2 IMPLANT
SYR CONTROL 10ML LL (SYRINGE) ×4 IMPLANT
TOWEL OR 17X26 10 PK STRL BLUE (TOWEL DISPOSABLE) ×2 IMPLANT
TRAY FOLEY W/BAG SLVR 14FR (SET/KITS/TRAYS/PACK) ×2 IMPLANT

## 2019-04-13 NOTE — Progress Notes (Signed)
Talked to Dr. Dellis Filbert and patient does not need to be admitted to the hospital. She will spend 1 night in [facility] before D/C tomorrow.  Dawn in bed control notified that patient is staying in [facility] and does not need hospital bed.

## 2019-04-13 NOTE — Anesthesia Procedure Notes (Signed)
Procedure Name: Intubation Date/Time: 04/13/2019 7:39 AM Performed by: Bonney Aid, CRNA Pre-anesthesia Checklist: Patient identified, Emergency Drugs available, Suction available and Patient being monitored Patient Re-evaluated:Patient Re-evaluated prior to induction Oxygen Delivery Method: Circle system utilized Preoxygenation: Pre-oxygenation with 100% oxygen Induction Type: IV induction Ventilation: Mask ventilation without difficulty Laryngoscope Size: Mac and 3 Grade View: Grade I Tube type: Oral Number of attempts: 1 Airway Equipment and Method: Stylet Placement Confirmation: ETT inserted through vocal cords under direct vision,  positive ETCO2 and breath sounds checked- equal and bilateral Secured at: 17 cm Tube secured with: Tape Dental Injury: Teeth and Oropharynx as per pre-operative assessment  Comments: Grade 1 view, tube place thru cords easily but unable to advance past 17cm.  Easily ventilated. No pressure applied.  Possible stricture

## 2019-04-13 NOTE — H&P (Addendum)
Samantha Reed is an 48 y.o. female. [id number] Single S/P TL.  RP: TAH/Bilateral Salpingectomy for symptomatic large Fibroids  HPI: Menorrhagia associated with large uterine fibroids. Persistent Menorrhagia on the Progestin-only BCPs x 10/2018. Abdominopelvic discomfort worsening in the last few months. Update:  Discussed surgery with patient yesterday and this morning.  No menses recently and mild hot flushes.  May be perimenopausal, but still wants to proceed with surgery given the large size of the Fibroids with discomfort and the risk of further bleeding before being fully in menopause.   Pertinent Gynecological History: Menses: flow is excessive with use of many pads or tampons on heaviest days Contraception: tubal ligation Blood transfusions: none Sexually transmitted diseases: no past history Last mammogram: normal  Last pap: normal    Menstrual History: No LMP recorded.    Past Medical History:  Diagnosis Date  . Anemia   . Fibroid   . High cholesterol   . Hypertension   . Sleep apnea    no cpap mild    Past Surgical History:  Procedure Laterality Date  . BREAST SURGERY     LUMPECTOMY FROM BREAST ? WHICH BREAST   . TUBAL LIGATION      Family History  Problem Relation Age of Onset  . Heart disease Mother   . Hypertension Mother   . Alcohol abuse Father   . Early death Maternal Grandmother   . Hearing loss Maternal Grandmother   . Hyperlipidemia Maternal Grandmother   . Kidney disease Maternal Grandmother   . Stroke Maternal Grandmother     Social History:  reports that she has been smoking cigarettes. She has a 20.00 pack-year smoking history. She has never used smokeless tobacco. She reports current alcohol use of about 2.0 standard drinks of alcohol per week. She reports current drug use. Drug: Marijuana.  Allergies:  Allergies  Allergen Reactions  . Tramadol Itching    Medications Prior to Admission  Medication Sig Dispense Refill Last  Dose  . acetaminophen (TYLENOL) 500 MG tablet Take 1,000 mg by mouth every 6 (six) hours as needed for mild pain or headache.     . ferrous sulfate 325 (65 FE) MG tablet Take 1 tablet (325 mg total) by mouth 2 (two) times daily with a meal. (Patient taking differently: Take 325 mg by mouth 2 (two) times daily as needed (feels cold or sleepy). ) 180 tablet 1   . loratadine (CLARITIN) 10 MG tablet Take 10 mg by mouth daily as needed for allergies.     Marland Kitchen norethindrone (MICRONOR) 0.35 MG tablet TAKE 1 TABLET BY MOUTH EVERY DAY (Patient taking differently: No sig reported) 28 tablet 2   . rosuvastatin (CRESTOR) 5 MG tablet Take 1 tablet (5 mg total) by mouth daily. 90 tablet 1   . spironolactone (ALDACTONE) 25 MG tablet Take 1 tablet (25 mg total) by mouth daily. 90 tablet 1   . VITAMIN E PO Take 3-4 tablets by mouth daily.     Marland Kitchen zolpidem (AMBIEN) 10 MG tablet TAKE 1 TABLET BY MOUTH AT BEDTIME AS NEEDED FOR SLEEP (Patient taking differently: Take 10 mg by mouth at bedtime. ) 30 tablet 2     REVIEW OF SYSTEMS: A ROS was performed and pertinent positives and negatives are included in the history.  GENERAL: No fevers or chills. HEENT: No change in vision, no earache, sore throat or sinus congestion. NECK: No pain or stiffness. CARDIOVASCULAR: No chest pain or pressure. No palpitations. PULMONARY: No shortness of breath,  cough or wheeze. GASTROINTESTINAL: No abdominal pain, nausea, vomiting or diarrhea, melena or bright red blood per rectum. GENITOURINARY: No urinary frequency, urgency, hesitancy or dysuria. MUSCULOSKELETAL: No joint or muscle pain, no back pain, no recent trauma. DERMATOLOGIC: No rash, no itching, no lesions. ENDOCRINE: No polyuria, polydipsia, no heat or cold intolerance. No recent change in weight. HEMATOLOGICAL: No anemia or easy bruising or bleeding. NEUROLOGIC: No headache, seizures, numbness, tingling or weakness. PSYCHIATRIC: No depression, no loss of interest in normal activity or change  in sleep pattern.     There were no vitals taken for this visit.  Physical Exam:  See office notes  Pelvic US 02/12/2019:  [facility]. Uterus is enlarged with the significantincrease in size since previous pelvic ultrasound April 2018. The largest diameter in April 2018 was 12.1 cm and it is now 16.77 cm. Overall uterine size today is measured at 16.77 x 13.41 x 7.3 cm. At least 12 fibroids are seen the largest measuring 6.2 x 5.3, 4.3 x 4.0, 3.2 x 2.8, and smaller. The endometriallining is thin at 3.8 mm, but distorted by fibroids. Small normal right ovary. Left ovary normal with a 2.3 cm simple follicle and a 9 mm calcification which are unchanged since previous ultrasound. No adnexal mass. No free fluid in the posterior cul-de-sac.  Covid Negative  Hb 13.6 on 04/08/2019   Assessment/Plan:  48 y.o. [id number]   1. Fibroids Large symptomatic uterine fibroids with menometrorrhagia and pelvic pain refractory to medical treatment.  The overall size of the uterus has enlarged to 16.77 cm x 13.41 x 7.3 cm.  The endometrial lining was thin but distorted by fibroids.  Decision made to proceed with total abdominal hysterectomy with bilateral salpingectomy and preservation of ovaries. Surgery and risks thoroughly reviewed with patient.  Preop management discussed and postop precautions reviewed.  Patient voiced understanding and agreement with plan.  2. Menorrhagia with regular cycle As above.  3. Secondary dysmenorrhea As above.  4. Secondary anemia Iron Sulfate supplement.                          Patient was counseled as to the risk of surgery to include the following:  1. Infection (prohylactic antibiotics will be administered)  2. DVT/Pulmonary Embolism (prophylactic pneumo compression stockings will be used)  3.Trauma to internal organs requiring additional surgical procedure to repair any injury to internal organs requiring perhaps additional hospitalization  days.  4.Hemmorhage requiring transfusion and blood products which carry risks such as anaphylactic reaction, hepatitis and AIDS  Patient had received literature information on the procedure scheduled and all her questions were answered and fully accepts all risk.  Marie-Lyne Gailene Youkhana 04/13/2019, 6:37 AM

## 2019-04-13 NOTE — Transfer of Care (Signed)
Immediate Anesthesia Transfer of Care Note  Patient: Samantha Reed  Procedure(s) Performed: HYSTERECTOMY ABDOMINAL WITH SALPINGECTOMY (Bilateral Abdomen)  Patient Location: PACU  Anesthesia Type:General  Level of Consciousness: awake, alert  and oriented  Airway & Oxygen Therapy: Patient Spontanous Breathing and Patient connected to nasal cannula oxygen  Post-op Assessment: Report given to RN  Post vital signs: Reviewed and stable  Last Vitals:  Vitals Value Taken Time  BP 153/86 04/13/19 0923  Temp    Pulse 78 04/13/19 0925  Resp 12 04/13/19 0925  SpO2 100 % 04/13/19 0925  Vitals shown include unvalidated device data.  Last Pain:  Vitals:   04/13/19 0651  TempSrc: Oral  PainSc: 0-No pain      Patients Stated Pain Goal: 6 ([id number])  Complications: No apparent anesthesia complications

## 2019-04-13 NOTE — Op Note (Signed)
Operative Note  04/13/2019  9:13 AM  PATIENT:  Samantha Reed  48 y.o. female  PRE-OPERATIVE DIAGNOSIS:  Large symptomatic fibroids, menorrhagia, pain  POST-OPERATIVE DIAGNOSIS:  Large symptomatic fibroids, menorrhagia, pain  PROCEDURE:  Procedure(s): TOTAL ABDOMINAL HYSTERECTOMY WITH BILATERAL SALPINGECTOMY  SURGEON:  Surgeon(s): Princess Bruins, MD Fontaine, Belinda Block, MD  ANESTHESIA:   general  FINDINGS: Uterus with large fibroids, status post bilateral tubal ligation, normal bilateral ovaries.  DESCRIPTION OF OPERATION: Under general anesthesia with endotracheal intubation the patient is in lithotomy position.  Anesthesia noted that the trachea was slightly narrowed and a smaller tube than usual was used.  The patient was prepped with DuraPrep on the abdomen and with Betadine on the suprapubic and vulvar areas.  She is draped as usual.  Timeout was done.  The patient is status post abdominoplasty with a long transverse incision at the lower abdomen.  The same incision is used, but shorter, in the middle.  Infiltration of Marcaine one quarter plain 10 cc.  Pfannenstiel incision with a scalpel.  Adipose tissue was opened with the electrocautery.  The aponeurosis is opened transversely with the electrocautery.  The recti muscles were separated from the aponeurosis on the midline superiorly and inferiorly.  The parietal peritoneum is opened longitudinally with Metzenbaums scissors.  The uterus is exteriorized with the fibroids.  The tubes are status post bilateral tubal ligation.  Bilateral ovaries are normal.  The ureters are normal in anatomy position.  No other pathology in the pelvis or in the abdomen.  The Balfour retractor was put in place with 2 wet laps to retain the bowels superiorly.  Kelly clamps are applied on each cornua of the uterus.  We started on the left side, grasping the left round ligament and sectioning with the electrocautery.  The visceral peritoneum is opened  anteriorly and we started distending the bladder.  A window was made in the left broad ligament.  We clamp the left utero-ovarian ligament, resection with Mayo scissors and suture with a Vicryl 0, using a free tie and a suture.  We proceeded exactly the same way on the right side.  We further descended the bladder anteriorly using Metzenbaums scissors and the finger on a sponge.  We then clamped the left uterine artery with a straight Heaney, we section with the scalpel and sutured with a Vicryl 0.  We descended on the left side until reaching the left angle of the vagina.  We proceeded the same way on the right side.  The left angle of the vagina was clamped bilaterally with curved Heaney, section with Mayo scissors and sutured with Vicryl 0 which were A Snap.  The Vagina Was Section Close to the Cervix Anteriorly and Posteriorly.  The specimen was removed from the abdominopelvic cavity.  We closed the vagina with 2 figure-of-eight's with Vicryl 0 in the middle.  We then completed the salpingectomy bilaterally, sectioning the mesosalpinx on the left and on the right with the electrocautery.  The tubes were sent together with the uterus with cervix to pathology.  Hemostasis was verified at all levels and completed where necessary with the electrocautery.  A figure-of-eight of Vicryl 3-0 was done on the anterior vagina to complete hemostasis at that level.  Hemostasis was adequate.  We irrigated and suctioned the pelvic cavity.  We added Aristat as a precaution.  We then removed the Balfour retractor and the 2 laps.  We closed the aponeurosis with 2 half running sutures of Vicryl 0.  We  closed the skin with a subcuticular suture of Vicryl 4-0.  We added Dermabond.  We put a honeycomb dressing.  The patient was brought to recovery room in good and stable status.  ESTIMATED BLOOD LOSS: 30 mL   Intake/Output Summary (Last 24 hours) at 04/13/2019 0913 Last data filed at 04/13/2019 0901 Gross per 24 hour  Intake -   Output 30 ml  Net -30 ml     BLOOD ADMINISTERED:none   LOCAL MEDICATIONS USED:  MARCAINE and EXPAREL   SPECIMEN:  Source of Specimen:  Uterus with cervix and bilateral tubes  DISPOSITION OF SPECIMEN:  PATHOLOGY  COUNTS:  YES  PLAN OF CARE: Transfer to PACU  Marie-Lyne LavoieMD9:13 AM

## 2019-04-13 NOTE — Anesthesia Postprocedure Evaluation (Signed)
Anesthesia Post Note  Patient: Samantha Reed  Procedure(s) Performed: HYSTERECTOMY ABDOMINAL WITH SALPINGECTOMY (Bilateral Abdomen)     Patient location during evaluation: PACU Anesthesia Type: General Level of consciousness: awake and alert Pain management: pain level controlled Vital Signs Assessment: post-procedure vital signs reviewed and stable Respiratory status: spontaneous breathing, nonlabored ventilation and respiratory function stable Cardiovascular status: blood pressure returned to baseline and stable Postop Assessment: no apparent nausea or vomiting Anesthetic complications: no    Last Vitals:  Vitals:   04/13/19 1006 04/13/19 1015  BP:  (!) 160/84  Pulse: 65 72  Resp: (!) 9 14  Temp:    SpO2: 95% 97%    Last Pain:  Vitals:   04/13/19 1006  TempSrc:   PainSc: [place]

## 2019-04-14 ENCOUNTER — Encounter (HOSPITAL_BASED_OUTPATIENT_CLINIC_OR_DEPARTMENT_OTHER): Payer: Self-pay | Admitting: Obstetrics & Gynecology

## 2019-04-14 ENCOUNTER — Telehealth: Payer: Self-pay | Admitting: *Deleted

## 2019-04-14 DIAGNOSIS — D259 Leiomyoma of uterus, unspecified: Secondary | ICD-10-CM | POA: Diagnosis not present

## 2019-04-14 LAB — CBC
HCT: 26.7 % — ABNORMAL LOW (ref 36.0–46.0)
Hemoglobin: 8.8 g/dL — ABNORMAL LOW (ref 12.0–15.0)
MCH: 31.3 pg (ref 26.0–34.0)
MCHC: 33 g/dL (ref 30.0–36.0)
MCV: 95 fL (ref 80.0–100.0)
Platelets: 221 10*3/uL (ref 150–400)
RBC: 2.81 MIL/uL — ABNORMAL LOW (ref 3.87–5.11)
RDW: 12.4 % (ref 11.5–15.5)
WBC: 11.9 10*3/uL — ABNORMAL HIGH (ref 4.0–10.5)
nRBC: 0 % (ref 0.0–0.2)

## 2019-04-14 LAB — SURGICAL PATHOLOGY

## 2019-04-14 MED ORDER — OXYCODONE HCL 5 MG PO TABS
ORAL_TABLET | ORAL | Status: AC
  Filled 2019-04-14: qty 1

## 2019-04-14 MED ORDER — OXYCODONE HCL 5 MG PO CAPS: 5.0000 mg | ORAL_CAPSULE | ORAL | 0 refills | Status: DC | PRN

## 2019-04-14 NOTE — Discharge Instructions (Signed)
Abdominal Hysterectomy, Care After This sheet gives you information about how to care for yourself after your procedure. Your health care provider may also give you more specific instructions. If you have problems or questions, contact your health care provider. What can I expect after the procedure? After the procedure, it is common to have:  Pain.  Tiredness (fatigue).  Poor appetite.  Lowered interest in sex.  Bleeding and discharge from your vagina. You may need to use a pad in your underwear after this procedure. Follow these instructions at home: Medicines  Take over-the-counter and prescription medicines only as told by your doctor.  Do not take aspirin or ibuprofen. These medicines can cause bleeding.  Ask your doctor if the medicine prescribed to you: ? Requires you to avoid driving or using heavy machinery. ? Can cause trouble pooping (constipation). You may need to take these actions to prevent or treat trouble pooping:  Take over-the-counter or prescription medicines.  Eat foods that are high in fiber. These include beans, whole grains, and fresh fruits and vegetables.  Limit foods that are high in fat and processed sugars. These include fried or sweet foods. Surgical cut (incision) care      Follow instructions from your doctor about how to take care of your cut from surgery (incision). Make sure you: ? Wash your hands with soap and water before and after you change your bandage (dressing). If you cannot use soap and water, use hand sanitizer. ? Change your bandage as told by your doctor. ? Leave stitches (sutures), skin glue, or skin tape (adhesive) strips in place. They may need to stay in place for 2 weeks or longer. If tape strips get loose and curl up, you may trim the loose edges. Do not remove tape strips completely unless your doctor says it is okay.  Check your cut from surgery every day for signs of infection. Check for: ? Redness, swelling, or  pain. ? Fluid or blood. ? Warmth. ? Pus or a bad smell. Activity  Rest as told by your doctor. ? Do not sit for a long time without moving. Get up to take short walks every 1-2 hours. This is important. Ask for help if you feel weak or unsteady.  Do not lift anything that is heavier than 10 lb (4.5 kg), or the limit that you are told, until your doctor says that it is safe.  Do not drive or use heavy machinery while taking prescription pain medicine.  Follow your doctor's advice about exercise, driving, and general activities. Return to your normal activities as told by your doctor. Ask your doctor what activities are safe for you. Lifestyle  Do not douche, use tampons, or have sex for at least 6 weeks or as told by your doctor.  Do not drink alcohol until your doctor says it is okay.  Do not use any products that contain nicotine or tobacco, such as cigarettes, e-cigarettes, and chewing tobacco. If you need help quitting, ask your doctor. General instructions   Drink enough fluid to keep your pee (urine) pale yellow.  Do not take baths, swim, or use a hot tub until your doctor approves. Ask your doctor if you may take showers. You may only be allowed to take sponge baths.  Try to have someone at home with you for the first 1-2 weeks to help you with your daily chores at home.  Keep the bandage dry until your doctor says it can be taken off.  Wear tight-fitting (  compression) stockings as told by your health care provider. These stockings help to prevent blood clots and reduce swelling in your legs. °· Keep all follow-up visits as told by your doctor. This is important. °Contact a doctor if: °· You have any of these signs of infection: °? Redness, swelling, or pain around your cut. °? Fluid or blood coming from your cut. °? Warmth coming from your cut. °? Pus or a bad smell coming from your cut. °? Chills or a fever. °· Your cut breaks open. °· You feel dizzy or light-headed. °· You  have pain or bleeding when you pee. °· You keep having watery poop (diarrhea). °· You keep feeling like you may vomit (nauseous) or keep vomiting. °· You have unusual fluid (discharge) coming from your vagina. °· You have a rash. °· You have any type of reaction to your medicine that is not normal, or you develop an allergy to your medicine. °· Your pain medicine does not help. °Get help right away if: °· You have a fever and your symptoms get worse all of a sudden. °· You have very bad pain in your belly (abdomen). °· You are short of breath. °· You faint. °· You have pain, swelling, or redness of your leg. °· You bleed a lot from your vagina and notice clumps of blood (clots). °Summary °· Do not take baths, swim, or use a hot tub until your doctor approves. Ask your doctor if you may take showers. You may only be allowed to take sponge baths. °· Do not lift anything that is heavier than 10 lb (4.5 kg), or the limit that you are told, until your doctor says that it is safe. °· Follow your doctor's advice about exercise, driving, and general activities. Ask your doctor what activities are safe for you. °· Try to have someone at home with you for the first 1-2 weeks to help you with your daily chores at home. °This information is not intended to replace advice given to you by your health care provider. Make sure you discuss any questions you have with your health care provider. °Document Released: 03/27/2008 Document Revised: 08/21/2018 Document Reviewed: 06/06/2016 °Elsevier Patient Education © 2020 [employer] ° °

## 2019-04-14 NOTE — Progress Notes (Signed)
POD#1 TAH/Bilateral Salpingectomy  Subjective: Patient reports tolerating PO, + flatus and no problems voiding.    Objective: I have reviewed patient's vital signs.  vital signs, intake and output and medications.  Vitals:   04/14/19 0215 04/14/19 0600  BP: 122/78 132/80  Pulse: 71 70  Resp: 16 16  Temp: 98.6 F (37 C) 98.2 F (36.8 C)  SpO2: 97% 97%   I/O last 3 completed shifts: In: 1480 [P.O.:680; I.V.:700; IV Piggyback:100] Out: 2455 [Urine:2425; Blood:30] No intake/output data recorded.  No results found for this or any previous visit (from the past 24 hour(s)).  EXAM General: alert and cooperative Resp: clear to auscultation bilaterally Cardio: regular rate and rhythm GI: soft, non-tender; bowel sounds normal; no masses,  no organomegaly, normal findings: bowel sounds normal and soft, non-tender and incision: clean, dry and intact Extremities: no edema, redness or tenderness in the calves or thighs Vaginal Bleeding: minimal  Assessment: s/p Procedure(s): HYSTERECTOMY ABDOMINAL WITH SALPINGECTOMY: stable, progressing well and tolerating diet  Plan: Discontinue IV fluids Discharge home  LOS: 1 day   Princess Bruins, MD 04/14/2019 8:03 AM

## 2019-04-14 NOTE — Telephone Encounter (Signed)
Pt was on TCM report admitted 04/13/19 for large symptomatic fibroids, menorrhagia, with pain. Pt underwent aTotal Abdominal Hysterectomy with Bilateral Salpingectomy. Pt tolerated procedure well, and was D/C 04/14/19 and will follow-up w/gynecologist Princess Bruins, MD Follow up in 3 week(s)...Johny Chess

## 2019-04-14 NOTE — Discharge Summary (Signed)
Physician Discharge Summary  Patient ID: Samantha Reed MRN: [id number] DOB/AGE: [birth year] 49 y.o.  Admit date: 04/13/2019 Discharge date: 04/14/2019  Admission Diagnoses: large symptomatic fibroids, menorrhagia, pain   Discharge Diagnoses: Same Active Problems:   Fibroids   Postoperative state  Discharged Condition: good  Consults:None  Significant Diagnostic Studies: None  Treatments:surgery: Total Abdominal Hysterectomy with Bilateral Salpingectomy  Vitals:   04/14/19 0215 04/14/19 0600  BP: 122/78 132/80  Pulse: 71 70  Resp: 16 16  Temp: 98.6 F (37 C) 98.2 F (36.8 C)  SpO2: 97% 97%     No intake/output data recorded.   Hospital Course: Good  Discharge Exam: Normal  Disposition: Home     Allergies as of 04/14/2019      Reactions   Tramadol Itching   Vicodin [hydrocodone-acetaminophen] Itching      Medication List    STOP taking these medications   norethindrone 0.35 MG tablet Commonly known as: MICRONOR     TAKE these medications   acetaminophen 500 MG tablet Commonly known as: TYLENOL Take 1,000 mg by mouth every 6 (six) hours as needed for mild pain or headache.   ferrous sulfate 325 (65 FE) MG tablet Take 1 tablet (325 mg total) by mouth 2 (two) times daily with a meal. What changed:   when to take this  reasons to take this   loratadine 10 MG tablet Commonly known as: CLARITIN Take 10 mg by mouth daily as needed for allergies.   oxycodone 5 MG capsule Commonly known as: OXY-IR Take 1 capsule (5 mg total) by mouth every 4 (four) hours as needed.   rosuvastatin 5 MG tablet Commonly known as: CRESTOR Take 1 tablet (5 mg total) by mouth daily.   spironolactone 25 MG tablet Commonly known as: Aldactone Take 1 tablet (25 mg total) by mouth daily.   VITAMIN E PO Take 3-4 tablets by mouth daily.   zolpidem 10 MG tablet Commonly known as: AMBIEN TAKE 1 TABLET BY MOUTH AT BEDTIME AS NEEDED FOR SLEEP What changed:    when to take this  additional instructions        Follow-up Information    Princess Bruins, MD Follow up in 3 week(s).   Specialty: Obstetrics and Gynecology Contact information: [place] [address] [phone]            Signed: Princess Bruins 04/14/2019, 8:10 AM

## 2019-04-14 NOTE — Progress Notes (Signed)
CBC resulted, Hgb 8.8. Patient asymptomatic. VSS. Dr. Dellis Filbert notified, orders to D/C patient and for patient to continue taking iron at home as previously prescribed.

## 2019-04-17 NOTE — Telephone Encounter (Signed)
I called patient and informed her of this result.

## 2019-04-20 ENCOUNTER — Telehealth: Payer: Self-pay

## 2019-04-20 NOTE — Telephone Encounter (Signed)
Fleet enema.  Colace 100 mg BID.  Water/Metamucile.  Walking.

## 2019-04-20 NOTE — Telephone Encounter (Signed)
Patient informed. 

## 2019-04-20 NOTE — Telephone Encounter (Signed)
No BM since surgery last Monday.  C/o gassy. Cannot eat or drink because it bloats her up as soon as she takes a sip.    What to recommend?

## 2019-04-24 ENCOUNTER — Emergency Department (HOSPITAL_COMMUNITY): Payer: 59

## 2019-04-24 ENCOUNTER — Other Ambulatory Visit: Payer: Self-pay

## 2019-04-24 ENCOUNTER — Inpatient Hospital Stay (HOSPITAL_COMMUNITY)
Admission: EM | Admit: 2019-04-24 | Discharge: 2019-05-04 | DRG: 862 | Disposition: A | Discharge status: Home / Self Care | Payer: 59 | Attending: Obstetrics & Gynecology | Admitting: Obstetrics & Gynecology

## 2019-04-24 ENCOUNTER — Encounter (HOSPITAL_COMMUNITY): Payer: Self-pay | Admitting: Emergency Medicine

## 2019-04-24 DIAGNOSIS — Z811 Family history of alcohol abuse and dependence: Secondary | ICD-10-CM

## 2019-04-24 DIAGNOSIS — K567 Ileus, unspecified: Secondary | ICD-10-CM | POA: Diagnosis present

## 2019-04-24 DIAGNOSIS — Y838 Other surgical procedures as the cause of abnormal reaction of the patient, or of later complication, without mention of misadventure at the time of the procedure: Secondary | ICD-10-CM | POA: Diagnosis present

## 2019-04-24 DIAGNOSIS — G4730 Sleep apnea, unspecified: Secondary | ICD-10-CM | POA: Diagnosis present

## 2019-04-24 DIAGNOSIS — E876 Hypokalemia: Secondary | ICD-10-CM | POA: Diagnosis present

## 2019-04-24 DIAGNOSIS — D638 Anemia in other chronic diseases classified elsewhere: Secondary | ICD-10-CM | POA: Diagnosis present

## 2019-04-24 DIAGNOSIS — K651 Peritoneal abscess: Secondary | ICD-10-CM | POA: Diagnosis present

## 2019-04-24 DIAGNOSIS — E8770 Fluid overload, unspecified: Secondary | ICD-10-CM | POA: Diagnosis not present

## 2019-04-24 DIAGNOSIS — Z90722 Acquired absence of ovaries, bilateral: Secondary | ICD-10-CM | POA: Diagnosis not present

## 2019-04-24 DIAGNOSIS — D62 Acute posthemorrhagic anemia: Secondary | ICD-10-CM | POA: Diagnosis present

## 2019-04-24 DIAGNOSIS — F17210 Nicotine dependence, cigarettes, uncomplicated: Secondary | ICD-10-CM | POA: Diagnosis present

## 2019-04-24 DIAGNOSIS — Z79899 Other long term (current) drug therapy: Secondary | ICD-10-CM

## 2019-04-24 DIAGNOSIS — Z8249 Family history of ischemic heart disease and other diseases of the circulatory system: Secondary | ICD-10-CM | POA: Diagnosis not present

## 2019-04-24 DIAGNOSIS — R109 Unspecified abdominal pain: Secondary | ICD-10-CM | POA: Diagnosis not present

## 2019-04-24 DIAGNOSIS — E7800 Pure hypercholesterolemia, unspecified: Secondary | ICD-10-CM | POA: Diagnosis present

## 2019-04-24 DIAGNOSIS — Z885 Allergy status to narcotic agent status: Secondary | ICD-10-CM | POA: Diagnosis not present

## 2019-04-24 DIAGNOSIS — T8143XA Infection following a procedure, organ and space surgical site, initial encounter: Secondary | ICD-10-CM | POA: Diagnosis not present

## 2019-04-24 DIAGNOSIS — Z791 Long term (current) use of non-steroidal anti-inflammatories (NSAID): Secondary | ICD-10-CM | POA: Diagnosis not present

## 2019-04-24 DIAGNOSIS — Z79891 Long term (current) use of opiate analgesic: Secondary | ICD-10-CM

## 2019-04-24 DIAGNOSIS — Z9889 Other specified postprocedural states: Secondary | ICD-10-CM

## 2019-04-24 DIAGNOSIS — Z8349 Family history of other endocrine, nutritional and metabolic diseases: Secondary | ICD-10-CM | POA: Diagnosis not present

## 2019-04-24 DIAGNOSIS — Z90710 Acquired absence of both cervix and uterus: Secondary | ICD-10-CM | POA: Diagnosis not present

## 2019-04-24 DIAGNOSIS — Z823 Family history of stroke: Secondary | ICD-10-CM

## 2019-04-24 DIAGNOSIS — Z20828 Contact with and (suspected) exposure to other viral communicable diseases: Secondary | ICD-10-CM | POA: Diagnosis present

## 2019-04-24 DIAGNOSIS — I10 Essential (primary) hypertension: Secondary | ICD-10-CM | POA: Diagnosis present

## 2019-04-24 DIAGNOSIS — Z4803 Encounter for change or removal of drains: Secondary | ICD-10-CM

## 2019-04-24 DIAGNOSIS — N739 Female pelvic inflammatory disease, unspecified: Secondary | ICD-10-CM | POA: Diagnosis present

## 2019-04-24 LAB — COMPREHENSIVE METABOLIC PANEL
ALT: 23 U/L (ref 0–44)
AST: 39 U/L (ref 15–41)
Albumin: 2.9 g/dL — ABNORMAL LOW (ref 3.5–5.0)
Alkaline Phosphatase: 85 U/L (ref 38–126)
Anion gap: 13 (ref 5–15)
BUN: 25 mg/dL — ABNORMAL HIGH (ref 6–20)
CO2: 26 mmol/L (ref 22–32)
Calcium: 9 mg/dL (ref 8.9–10.3)
Chloride: 96 mmol/L — ABNORMAL LOW (ref 98–111)
Creatinine, Ser: 1.39 mg/dL — ABNORMAL HIGH (ref 0.44–1.00)
GFR calc Af Amer: 51 mL/min — ABNORMAL LOW (ref >60)
GFR calc non Af Amer: 44 mL/min — ABNORMAL LOW (ref >60)
Glucose, Bld: 108 mg/dL — ABNORMAL HIGH (ref 70–99)
Potassium: 3.4 mmol/L — ABNORMAL LOW (ref 3.5–5.1)
Sodium: 135 mmol/L (ref 135–145)
Total Bilirubin: 1.9 mg/dL — ABNORMAL HIGH (ref 0.3–1.2)
Total Protein: 8.3 g/dL — ABNORMAL HIGH (ref 6.5–8.1)

## 2019-04-24 LAB — CBC
HCT: 27.5 % — ABNORMAL LOW (ref 36.0–46.0)
Hemoglobin: 9.3 g/dL — ABNORMAL LOW (ref 12.0–15.0)
MCH: 30.8 pg (ref 26.0–34.0)
MCHC: 33.8 g/dL (ref 30.0–36.0)
MCV: 91.1 fL (ref 80.0–100.0)
Platelets: 546 10*3/uL — ABNORMAL HIGH (ref 150–400)
RBC: 3.02 MIL/uL — ABNORMAL LOW (ref 3.87–5.11)
RDW: 13.2 % (ref 11.5–15.5)
WBC: 18.8 10*3/uL — ABNORMAL HIGH (ref 4.0–10.5)
nRBC: 0.1 % (ref 0.0–0.2)

## 2019-04-24 LAB — LIPASE, BLOOD: Lipase: 17 U/L (ref 11–51)

## 2019-04-24 LAB — URINALYSIS, MICROSCOPIC (REFLEX)
RBC / HPF: >50 RBC/hpf (ref 0–5)
WBC, UA: >50 WBC/hpf (ref 0–5)

## 2019-04-24 LAB — URINALYSIS, ROUTINE W REFLEX MICROSCOPIC

## 2019-04-24 LAB — LACTIC ACID, PLASMA: Lactic Acid, Venous: 1.3 mmol/L (ref 0.5–1.9)

## 2019-04-24 MED ORDER — KETOROLAC TROMETHAMINE 30 MG/ML IJ SOLN
30.0000 mg | Freq: Four times a day (QID) | INTRAMUSCULAR | Status: AC
  Administered 2019-04-24 – 2019-04-29 (×17): 30 mg via INTRAVENOUS
  Filled 2019-04-24 (×18): qty 1

## 2019-04-24 MED ORDER — ONDANSETRON HCL 4 MG PO TABS: 4.0000 mg | ORAL_TABLET | Freq: Four times a day (QID) | ORAL | Status: DC | PRN

## 2019-04-24 MED ORDER — PIPERACILLIN-TAZOBACTAM 3.375 G IVPB 30 MIN
3.3750 g | Freq: Once | INTRAVENOUS | Status: AC
  Administered 2019-04-24: 3.375 g via INTRAVENOUS
  Filled 2019-04-24: qty 50

## 2019-04-24 MED ORDER — KETOROLAC TROMETHAMINE 30 MG/ML IJ SOLN
30.0000 mg | Freq: Four times a day (QID) | INTRAMUSCULAR | Status: AC
  Filled 2019-04-24 (×4): qty 1

## 2019-04-24 MED ORDER — SIMETHICONE 80 MG PO CHEW
80.0000 mg | CHEWABLE_TABLET | Freq: Four times a day (QID) | ORAL | Status: DC | PRN
  Administered 2019-04-28 (×2): 80 mg via ORAL
  Filled 2019-04-24 (×3): qty 1

## 2019-04-24 MED ORDER — DEXTROSE-NACL 5-0.45 % IV SOLN
INTRAVENOUS | Status: DC
  Administered 2019-04-24 – 2019-04-26 (×3): via INTRAVENOUS

## 2019-04-24 MED ORDER — MORPHINE SULFATE (PF) 2 MG/ML IV SOLN
1.0000 mg | INTRAVENOUS | Status: DC | PRN
  Administered 2019-04-26: 1 mg via INTRAVENOUS
  Administered 2019-04-27 (×3): 2 mg via INTRAVENOUS
  Filled 2019-04-24 (×6): qty 1

## 2019-04-24 MED ORDER — ONDANSETRON HCL 4 MG/2ML IJ SOLN: 4.0000 mg | Freq: Four times a day (QID) | INTRAMUSCULAR | Status: DC | PRN

## 2019-04-24 MED ORDER — SODIUM CHLORIDE 0.9 % IV BOLUS
1000.0000 mL | Freq: Once | INTRAVENOUS | Status: AC
  Administered 2019-04-24: 1000 mL via INTRAVENOUS

## 2019-04-24 MED ORDER — MENTHOL 3 MG MT LOZG
1.0000 | LOZENGE | OROMUCOSAL | Status: DC | PRN
  Filled 2019-04-24: qty 9

## 2019-04-24 NOTE — ED Notes (Addendum)
Pt able to ambulate short distances and transfer from wheel chair to bed with standby assist.

## 2019-04-24 NOTE — ED Triage Notes (Signed)
Pt c/o feeling dehydrated, having gas and constipation since her surgery on 10/12 (partial hysterectomy). Reports took enemas on Monday and had small BM. Pt reports no appetite. States urine "smells disgusting"/. When goes to bathroom noticed long periods of passing darker, red blood that stinks. Started having to wear menstrual pads again starting 2-3 days ago.

## 2019-04-24 NOTE — ED Provider Notes (Signed)
[facility] DEPT Provider Note   CSN: [id number] Arrival date & time: 04/24/19  1736     History   Chief Complaint Chief Complaint  Patient presents with   Fatigue   Constipation   Post-op Problem    HPI Samantha Reed is a 48 y.o. female.     HPI Patient presents with her cousin who assists with the HPI. Patient is now 11 days status post total abdominal hysterectomy, performed due to fibroids, dysmenorrhea. She notes that she was transiently improving, but after about 3 days postop she began having increasing abdominal pain, anorexia, nausea. Now, over the past 3 or 4 days she has had worsening nausea, p.o. intolerance, vomiting, minimal bowel movements. She has been using Colace, has spoken with her obstetrician, but has worsening, rather than improving condition. Subjectively she has fever and chills as well. No chest pain, no dyspnea.  Past Medical History:  Diagnosis Date   Anemia    Fibroid    High cholesterol    Hypertension    Sleep apnea    no cpap mild    Patient Active Problem List   Diagnosis Date Noted   Fibroids 04/13/2019   Postoperative state 04/13/2019   Iron deficiency anemia due to chronic blood loss 09/11/2017   Hypokalemia 09/10/2017   Routine general medical examination at a health care facility 08/12/2017   History of cervical cancer 11/05/2016   Uterine leiomyoma 05/30/2016   Smoker 05/30/2016   Essential hypertension, benign 04/26/2016   Hyperlipidemia LDL goal <130 04/26/2016   Primary insomnia 04/26/2016   Tobacco abuse disorder 04/26/2016    Past Surgical History:  Procedure Laterality Date   BREAST SURGERY     LUMPECTOMY FROM BREAST ? WHICH BREAST    HYSTERECTOMY ABDOMINAL WITH SALPINGECTOMY Bilateral 04/13/2019   Procedure: HYSTERECTOMY ABDOMINAL WITH SALPINGECTOMY;  Surgeon: Princess Bruins, MD;  Location: [facility];  Service: Gynecology;   Laterality: Bilateral;  request 7:30am OR Time in [place] block requests 2 hours OR time   TUBAL LIGATION       OB History    Gravida  5   Para  2   Term      Preterm      AB  3   Living  2     SAB      TAB      Ectopic      Multiple      Live Births               Home Medications    Prior to Admission medications   Medication Sig Start Date End Date Taking? Authorizing Provider  ferrous sulfate 325 (65 FE) MG tablet Take 1 tablet (325 mg total) by mouth 2 (two) times daily with a meal. Patient taking differently: Take 325 mg by mouth 2 (two) times daily as needed (feels cold or sleepy).  09/11/17  Yes Janith Lima, MD  ibuprofen (ADVIL) 200 MG tablet Take 200 mg by mouth every 6 (six) hours as needed for moderate pain.   Yes [provider]  loratadine (CLARITIN) 10 MG tablet Take 10 mg by mouth daily as needed for allergies.   Yes [provider]  oxycodone (OXY-IR) 5 MG capsule Take 1 capsule (5 mg total) by mouth every 4 (four) hours as needed. Patient taking differently: Take 5 mg by mouth every 4 (four) hours as needed for pain.  04/14/19  Yes Princess Bruins, MD  rosuvastatin ([place])  5 MG tablet Take 1 tablet (5 mg total) by mouth daily. 08/20/18  Yes Janith Lima, MD  spironolactone (ALDACTONE) 25 MG tablet Take 1 tablet (25 mg total) by mouth daily. 03/20/18  Yes Janith Lima, MD  VITAMIN E PO Take 3 tablets by mouth daily.    Yes [provider]  zolpidem (AMBIEN) 10 MG tablet TAKE 1 TABLET BY MOUTH AT BEDTIME AS NEEDED FOR SLEEP Patient taking differently: Take 10 mg by mouth at bedtime.  01/07/19  Yes Janith Lima, MD    Family History Family History  Problem Relation Age of Onset   Heart disease Mother    Hypertension Mother    Alcohol abuse Father    Early death Maternal Grandmother    Hearing loss Maternal Grandmother    Hyperlipidemia Maternal Grandmother    Kidney disease Maternal  Grandmother    Stroke Maternal Grandmother     Social History Social History   Tobacco Use   Smoking status: Current Every Day Smoker    Packs/day: 1.00    Years: 20.00    Pack years: 20.00    Types: Cigarettes   Smokeless tobacco: Never Used  Substance Use Topics   Alcohol use: Yes    Alcohol/week: 2.0 standard drinks    Types: 2 Glasses of wine per week    Comment: occasional   Drug use: Yes    Types: Marijuana    Comment: 1-2x/week     Allergies   Tramadol and Vicodin [hydrocodone-acetaminophen]   Review of Systems Review of Systems  Constitutional:       Per HPI, otherwise negative  HENT:       Per HPI, otherwise negative  Respiratory:       Per HPI, otherwise negative  Cardiovascular:       Per HPI, otherwise negative  Gastrointestinal: Positive for abdominal distention, abdominal pain, nausea and vomiting.  Endocrine:       Negative aside from HPI  Genitourinary: Positive for vaginal bleeding.  Musculoskeletal:       Per HPI, otherwise negative  Skin: Negative.   Neurological: Positive for weakness. Negative for syncope.     Physical Exam Updated Vital Signs BP 116/70    Pulse 85    Temp 98.5 F (36.9 C) (Oral)    Resp 20    LMP 11/03/2018    SpO2 100%   Physical Exam Vitals signs and nursing note reviewed.  Constitutional:      General: She is not in acute distress.    Appearance: She is well-developed.  HENT:     Head: Normocephalic and atraumatic.  Eyes:     Conjunctiva/sclera: Conjunctivae normal.  Cardiovascular:     Rate and Rhythm: Regular rhythm. Tachycardia present.  Pulmonary:     Effort: Pulmonary effort is normal. No respiratory distress.     Breath sounds: Normal breath sounds. No stridor.  Abdominal:     General: There is no distension.     Tenderness: There is abdominal tenderness. There is guarding.     Comments: Guarding throughout the lower abdomen, but incision site with adhesive dressing appears unremarkable.    Skin:    General: Skin is warm and dry.  Neurological:     Mental Status: She is alert and oriented to person, place, and time.     Cranial Nerves: No cranial nerve deficit.      ED Treatments / Results  Labs (all labs ordered are listed, but only abnormal results are  displayed) Labs Reviewed  COMPREHENSIVE METABOLIC PANEL - Abnormal; Notable for the following components:      Result Value   Potassium 3.4 (*)    Chloride 96 (*)    Glucose, Bld 108 (*)    BUN 25 (*)    Creatinine, Ser 1.39 (*)    Total Protein 8.3 (*)    Albumin 2.9 (*)    Total Bilirubin 1.9 (*)    GFR calc non Af Amer 44 (*)    GFR calc Af Amer 51 (*)    All other components within normal limits  CBC - Abnormal; Notable for the following components:   WBC 18.8 (*)    RBC 3.02 (*)    Hemoglobin 9.3 (*)    HCT 27.5 (*)    Platelets 546 (*)    All other components within normal limits  URINALYSIS, ROUTINE W REFLEX MICROSCOPIC - Abnormal; Notable for the following components:   Color, Urine BROWN (*)    APPearance TURBID (*)    Glucose, UA   (*)    Value: TEST NOT REPORTED DUE TO COLOR INTERFERENCE OF URINE PIGMENT   Hgb urine dipstick   (*)    Value: TEST NOT REPORTED DUE TO COLOR INTERFERENCE OF URINE PIGMENT   Bilirubin Urine   (*)    Value: TEST NOT REPORTED DUE TO COLOR INTERFERENCE OF URINE PIGMENT   Ketones, ur   (*)    Value: TEST NOT REPORTED DUE TO COLOR INTERFERENCE OF URINE PIGMENT   Protein, ur   (*)    Value: TEST NOT REPORTED DUE TO COLOR INTERFERENCE OF URINE PIGMENT   Nitrite   (*)    Value: TEST NOT REPORTED DUE TO COLOR INTERFERENCE OF URINE PIGMENT   Leukocytes,Ua   (*)    Value: TEST NOT REPORTED DUE TO COLOR INTERFERENCE OF URINE PIGMENT   All other components within normal limits  URINALYSIS, MICROSCOPIC (REFLEX) - Abnormal; Notable for the following components:   Bacteria, UA MANY (*)    All other components within normal limits  LIPASE, BLOOD    Radiology Ct Abdomen  Pelvis Wo Contrast  Result Date: 04/24/2019 CLINICAL DATA:  48 year old female with recent hysterectomy on 04/13/2019. Foul-smelling urine and leukocytosis. EXAM: CT ABDOMEN AND PELVIS WITHOUT CONTRAST TECHNIQUE: Multidetector CT imaging of the abdomen and pelvis was performed following the standard protocol without IV contrast. COMPARISON:  None. FINDINGS: Evaluation of this exam is limited in the absence of intravenous contrast. Lower chest: The visualized lung bases are clear. There is hypoattenuation of the cardiac blood pool suggestive of a degree of anemia. Clinical correlation is recommended. Hepatobiliary: Several small hepatic hypodense lesions measuring up to 1.5 cm in the left lobe are not characterized on this noncontrast study but may represent cysts or hemangioma. No intrahepatic biliary ductal dilatation. There is small amount of sludge and several small stones in the gallbladder. No pericholecystic fluid or evidence of acute cholecystitis by CT. Pancreas: Unremarkable. No pancreatic ductal dilatation or surrounding inflammatory changes. Spleen: Normal in size without focal abnormality. Adrenals/Urinary Tract: The adrenal glands are unremarkable. There is no hydronephrosis or nephrolithiasis on either side. The visualized ureters appear unremarkable. The urinary bladder is partially distended. There is diffuse thickened appearance of the bladder wall with perivesical stranding, likely reactive to inflammatory changes of the pelvis. Correlation with urinalysis recommended to exclude cystitis. Stomach/Bowel: Evaluation of the bowel is limited in the absence of oral contrast. There is moderate amount of stool throughout the colon.  No evidence of bowel obstruction. The appendix is unremarkable as visualized. Vascular/Lymphatic: Moderate atherosclerotic calcification of the aorta and iliac arteries. The vasculature is otherwise grossly unremarkable on this noncontrast CT. No portal venous gas. Mildly  enlarged pericecal lymph nodes. Reproductive: Hysterectomy. Ill-defined soft tissue density in the left hemipelvis anterior to the psoas muscle likely represents the left ovary. A subcentimeter calcific focus within this area likely represents a calcified corpus albicans. Other: Loculated appearing fluid collection in the posterior pelvis measures approximately 8.6 x 4.8 cm (series 2, image 71) most consistent with an infected collection/developing abscess. An additional ill-defined collection in the left anterior lower abdomen/pelvis measures approximately 7.7 x 3.8 cm in greatest axial dimensions (series 2, image 61) anterior to the left ovary. There is a pocket of loculated extraluminal appearing air in the anterior pelvis (series 2, image 61) measuring 3.7 x 4.2 cm likely air within an infected collection. Musculoskeletal: Postsurgical changes of the anterior pelvic wall. There is a 1.7 x 11 cm fluid collection in the subcutaneous soft tissues of the anterior pelvic wall along the surgical incision. This may represent a seroma but concerning for an infected fluid. Stranding of the subcutaneous soft tissues of the anterior abdominal wall and periumbilical region. No acute osseous pathology. IMPRESSION: 1. Postsurgical changes of hysterectomy with several loculated fluid collection in the lower abdomen and pelvis most consistent with developing abscesses. 2. Postsurgical changes of the anterior pelvic wall with seroma or infected fluid in the subcutaneous soft tissues along the surgical incision. 3. No hydronephrosis or nephrolithiasis. 4. Thickened appearance of the bladder wall with perivesical stranding likely reactive to inflammatory changes of the pelvis. Correlation with urinalysis recommended to exclude cystitis. 5. No bowel obstruction. Normal appendix. 6. Aortic Atherosclerosis (ICD10-I70.0). These results were called by telephone at the time of interpretation on 04/24/2019 at 9:03 pm to provider Carmin Muskrat , who verbally acknowledged these results. Electronically Signed   By: Anner Crete M.D.   On: 04/24/2019 21:09    Procedures Procedures (including critical care time)  Medications Ordered in ED Medications  piperacillin-tazobactam (ZOSYN) IVPB 3.375 g (has no administration in time range)  sodium chloride 0.9 % bolus 1,000 mL (1,000 mLs Intravenous New Bag/Given (Non-Interop) 04/24/19 2016)     Initial Impression / Assessment and Plan / ED Course  I have reviewed the triage vital signs and the nursing notes.  Pertinent labs & imaging results that were available during my care of the patient were reviewed by me and considered in my medical decision making (see chart for details).    I discussed the patient's radiographic findings with our radiologist.  On with concern for multiple abscesses patient has started broad-spectrum antibiotics. Patient's labs consistent with infection, with leukocytosis. However, the patient is not hypotensive, which is somewhat reassuring. Given consideration of postoperative infection, I discussed the case with her gynecology team, and the patient will require admission for further monitoring, management.    9:13 PM Patient in similar condition, aware of all findings, results.  Final Clinical Impressions(s) / ED Diagnoses   Final diagnoses:  Intra-abdominal abscess [facility])     Carmin Muskrat, MD 04/24/19 2114

## 2019-04-24 NOTE — H&P (Signed)
Samantha Reed is an 48 y.o. female G91P2 SAA female with h/o TAH/Bilateral salpingectomy performed 04/13/2019 by Dr. Dellis Filbert due to fibroids who presented to the ER with complaints of increased abdominal pain, sensation of constipation, nausea, and overall weakness.  She reports she did well the first 5-6 days post op but began to feel weaker over the weekend and felt like her bowels were not working well.  She has decreased appetite at this time as well as increased abdominal distension and gas.  She called Dr. Assunta Curtis office on Monday and was given advice on what to do to help with constipation.  She gradually felt worse and began having dark vaginal drainage on Wednesday night.  She reports this had a very foul smelling odor.  She then began to have abdominal pain that worsened until she felt she needed to be seen tonight.  She is having nausea and decreased appetite but no emesis.  She does not think she has run a fever.  She denies SOB and does not have palpitations.  Pertinent Gynecological History: Contraception: status post hysterectomy DES exposure: denies Blood transfusions: none Sexually transmitted diseases: no past history Previous GYN Procedures: BTL and TAH/bilateral salpingectomy 04/13/2019  Last mammogram: normal Date: 12/12/2017 Last pap: normal/neg HR HPV Date: 10/16/2018 OB History: G3, P2   Menstrual History: Patient's last menstrual period was 11/03/2018.    Past Medical History:  Diagnosis Date  . Anemia   . Fibroid   . High cholesterol   . Hypertension   . Sleep apnea    no cpap mild    Past Surgical History:  Procedure Laterality Date  . BREAST SURGERY     LUMPECTOMY FROM BREAST ? WHICH BREAST   . HYSTERECTOMY ABDOMINAL WITH SALPINGECTOMY Bilateral 04/13/2019   Procedure: HYSTERECTOMY ABDOMINAL WITH SALPINGECTOMY;  Surgeon: Princess Bruins, MD;  Location: [facility];  Service: Gynecology;  Laterality: Bilateral;  request 7:30am OR Time in  [facility] requests 2 hours OR time  . TUBAL LIGATION      Family History  Problem Relation Age of Onset  . Heart disease Mother   . Hypertension Mother   . Alcohol abuse Father   . Early death Maternal Grandmother   . Hearing loss Maternal Grandmother   . Hyperlipidemia Maternal Grandmother   . Kidney disease Maternal Grandmother   . Stroke Maternal Grandmother     Social History:  reports that she has been smoking cigarettes. She has a 20.00 pack-year smoking history. She has never used smokeless tobacco. She reports current alcohol use of about 2.0 standard drinks of alcohol per week. She reports current drug use. Drug: Marijuana.  Allergies:  Allergies  Allergen Reactions  . Tramadol Itching  . Vicodin [Hydrocodone-Acetaminophen] Itching    (Not in a hospital admission)   Review of Systems  Unable to perform ROS: Other  Constitutional: Negative for chills.  HENT: Negative.   Eyes: Negative.   Respiratory: Negative.   Cardiovascular: Negative.   Gastrointestinal: Positive for abdominal pain, constipation and nausea. Negative for blood in stool, diarrhea, heartburn, melena and vomiting.  Genitourinary: Negative for dysuria, flank pain, frequency, hematuria and urgency.  Musculoskeletal: Negative.   Skin: Negative.   Neurological: Negative.   Endo/Heme/Allergies: Negative.   Psychiatric/Behavioral: Negative.     Blood pressure 117/63, pulse 88, temperature 98.5 F (36.9 C), temperature source Oral, resp. rate (!) 23, last menstrual period 11/03/2018, SpO2 100 %. Physical Exam  Constitutional: She is oriented to person, place, and time.  She appears well-developed and well-nourished.  HENT:  Head: Normocephalic and atraumatic.  Neck: Normal range of motion. Neck supple. No thyromegaly present.  Cardiovascular: Normal rate and regular rhythm.  Respiratory: Effort normal and breath sounds normal.  GI: Soft. She exhibits distension. She exhibits no mass.  There is abdominal tenderness. There is guarding. There is no rebound.  Quiet BS  Genitourinary: There is no rash, tenderness, lesion or injury on the right labia. There is no rash, tenderness, lesion or injury on the left labia.    Vaginal discharge (dark, foul smelling) present.     Genitourinary Comments: Surgically absent cervix and uterus, cuff is intact   Musculoskeletal: Normal range of motion.        General: No tenderness or edema.  Lymphadenopathy:       Right: No inguinal adenopathy present.       Left: No inguinal adenopathy present.  Neurological: She is alert and oriented to person, place, and time.  Skin: Skin is warm and dry.  Psychiatric: She has a normal mood and affect.    Results for orders placed or performed during the hospital encounter of 04/24/19 (from the past 24 hour(s))  Urinalysis, Routine w reflex microscopic     Status: Abnormal   Collection Time: 04/24/19  5:49 PM  Result Value Ref Range   Color, Urine BROWN (A) YELLOW   APPearance TURBID (A) CLEAR   Specific Gravity, Urine  1.005 - 1.030    TEST NOT REPORTED DUE TO COLOR INTERFERENCE OF URINE PIGMENT   pH  5.0 - 8.0    TEST NOT REPORTED DUE TO COLOR INTERFERENCE OF URINE PIGMENT   Glucose, UA (A) NEGATIVE mg/dL    TEST NOT REPORTED DUE TO COLOR INTERFERENCE OF URINE PIGMENT   Hgb urine dipstick (A) NEGATIVE    TEST NOT REPORTED DUE TO COLOR INTERFERENCE OF URINE PIGMENT   Bilirubin Urine (A) NEGATIVE    TEST NOT REPORTED DUE TO COLOR INTERFERENCE OF URINE PIGMENT   Ketones, ur (A) NEGATIVE mg/dL    TEST NOT REPORTED DUE TO COLOR INTERFERENCE OF URINE PIGMENT   Protein, ur (A) NEGATIVE mg/dL    TEST NOT REPORTED DUE TO COLOR INTERFERENCE OF URINE PIGMENT   Nitrite (A) NEGATIVE    TEST NOT REPORTED DUE TO COLOR INTERFERENCE OF URINE PIGMENT   Leukocytes,Ua (A) NEGATIVE    TEST NOT REPORTED DUE TO COLOR INTERFERENCE OF URINE PIGMENT  Urinalysis, Microscopic (reflex)     Status: Abnormal    Collection Time: 04/24/19  5:49 PM  Result Value Ref Range   RBC / HPF >50 0 - 5 RBC/hpf   WBC, UA >50 0 - 5 WBC/hpf   Bacteria, UA MANY (A) NONE SEEN   Squamous Epithelial / LPF 11-20 0 - 5  Lipase, blood     Status: None   Collection Time: 04/24/19  5:53 PM  Result Value Ref Range   Lipase 17 11 - 51 U/L  Comprehensive metabolic panel     Status: Abnormal   Collection Time: 04/24/19  5:53 PM  Result Value Ref Range   Sodium 135 135 - 145 mmol/L   Potassium 3.4 (L) 3.5 - 5.1 mmol/L   Chloride 96 (L) 98 - 111 mmol/L   CO2 26 22 - 32 mmol/L   Glucose, Bld 108 (H) 70 - 99 mg/dL   BUN 25 (H) 6 - 20 mg/dL   Creatinine, Ser 1.39 (H) 0.44 - 1.00 mg/dL   Calcium 9.0 8.9 -  10.3 mg/dL   Total Protein 8.3 (H) 6.5 - 8.1 g/dL   Albumin 2.9 (L) 3.5 - 5.0 g/dL   AST 39 15 - 41 U/L   ALT 23 0 - 44 U/L   Alkaline Phosphatase 85 38 - 126 U/L   Total Bilirubin 1.9 (H) 0.3 - 1.2 mg/dL   GFR calc non Af Amer 44 (L) >60 mL/min   GFR calc Af Amer 51 (L) >60 mL/min   Anion gap 13 5 - 15  CBC     Status: Abnormal   Collection Time: 04/24/19  5:53 PM  Result Value Ref Range   WBC 18.8 (H) 4.0 - 10.5 K/uL   RBC 3.02 (L) 3.87 - 5.11 MIL/uL   Hemoglobin 9.3 (L) 12.0 - 15.0 g/dL   HCT 27.5 (L) 36.0 - 46.0 %   MCV 91.1 80.0 - 100.0 fL   MCH 30.8 26.0 - 34.0 pg   MCHC 33.8 30.0 - 36.0 g/dL   RDW 13.2 11.5 - 15.5 %   Platelets 546 (H) 150 - 400 K/uL   nRBC 0.1 0.0 - 0.2 %    Ct Abdomen Pelvis Wo Contrast  Result Date: 04/24/2019 CLINICAL DATA:  48 year old female with recent hysterectomy on 04/13/2019. Foul-smelling urine and leukocytosis. EXAM: CT ABDOMEN AND PELVIS WITHOUT CONTRAST TECHNIQUE: Multidetector CT imaging of the abdomen and pelvis was performed following the standard protocol without IV contrast. COMPARISON:  None. FINDINGS: Evaluation of this exam is limited in the absence of intravenous contrast. Lower chest: The visualized lung bases are clear. There is hypoattenuation of the cardiac  blood pool suggestive of a degree of anemia. Clinical correlation is recommended. Hepatobiliary: Several small hepatic hypodense lesions measuring up to 1.5 cm in the left lobe are not characterized on this noncontrast study but may represent cysts or hemangioma. No intrahepatic biliary ductal dilatation. There is small amount of sludge and several small stones in the gallbladder. No pericholecystic fluid or evidence of acute cholecystitis by CT. Pancreas: Unremarkable. No pancreatic ductal dilatation or surrounding inflammatory changes. Spleen: Normal in size without focal abnormality. Adrenals/Urinary Tract: The adrenal glands are unremarkable. There is no hydronephrosis or nephrolithiasis on either side. The visualized ureters appear unremarkable. The urinary bladder is partially distended. There is diffuse thickened appearance of the bladder wall with perivesical stranding, likely reactive to inflammatory changes of the pelvis. Correlation with urinalysis recommended to exclude cystitis. Stomach/Bowel: Evaluation of the bowel is limited in the absence of oral contrast. There is moderate amount of stool throughout the colon. No evidence of bowel obstruction. The appendix is unremarkable as visualized. Vascular/Lymphatic: Moderate atherosclerotic calcification of the aorta and iliac arteries. The vasculature is otherwise grossly unremarkable on this noncontrast CT. No portal venous gas. Mildly enlarged pericecal lymph nodes. Reproductive: Hysterectomy. Ill-defined soft tissue density in the left hemipelvis anterior to the psoas muscle likely represents the left ovary. A subcentimeter calcific focus within this area likely represents a calcified corpus albicans. Other: Loculated appearing fluid collection in the posterior pelvis measures approximately 8.6 x 4.8 cm (series 2, image 71) most consistent with an infected collection/developing abscess. An additional ill-defined collection in the left anterior lower  abdomen/pelvis measures approximately 7.7 x 3.8 cm in greatest axial dimensions (series 2, image 61) anterior to the left ovary. There is a pocket of loculated extraluminal appearing air in the anterior pelvis (series 2, image 61) measuring 3.7 x 4.2 cm likely air within an infected collection. Musculoskeletal: Postsurgical changes of the anterior pelvic wall. There  is a 1.7 x 11 cm fluid collection in the subcutaneous soft tissues of the anterior pelvic wall along the surgical incision. This may represent a seroma but concerning for an infected fluid. Stranding of the subcutaneous soft tissues of the anterior abdominal wall and periumbilical region. No acute osseous pathology. IMPRESSION: 1. Postsurgical changes of hysterectomy with several loculated fluid collection in the lower abdomen and pelvis most consistent with developing abscesses. 2. Postsurgical changes of the anterior pelvic wall with seroma or infected fluid in the subcutaneous soft tissues along the surgical incision. 3. No hydronephrosis or nephrolithiasis. 4. Thickened appearance of the bladder wall with perivesical stranding likely reactive to inflammatory changes of the pelvis. Correlation with urinalysis recommended to exclude cystitis. 5. No bowel obstruction. Normal appendix. 6. Aortic Atherosclerosis (ICD10-I70.0). These results were called by telephone at the time of interpretation on 04/24/2019 at 9:03 pm to provider Carmin Muskrat , who verbally acknowledged these results. Electronically Signed   By: Anner Crete M.D.   On: 04/24/2019 21:09    Assessment/Plan: Post op day #11 after TAH/bilateral salpingectomy due to fibroids now with pelvic abscesses, hemodynamically stable Elevated WBC ct Hypertension Elevated lipids Smoker  Admit with zosyn 3.375mg  IV q 6 hours As cuff is intact and incision is intact, will communicate with interventional radiology for percutaneous drainage of the abscesses Coag studies in the AM SCDs  will be initiated  Megan Salon 04/24/2019, 10:58 PM

## 2019-04-25 ENCOUNTER — Inpatient Hospital Stay (HOSPITAL_COMMUNITY): Payer: 59

## 2019-04-25 ENCOUNTER — Encounter (HOSPITAL_COMMUNITY): Payer: Self-pay | Admitting: Radiology

## 2019-04-25 DIAGNOSIS — R109 Unspecified abdominal pain: Secondary | ICD-10-CM

## 2019-04-25 LAB — SARS CORONAVIRUS 2 (TAT 6-24 HRS): SARS Coronavirus 2: NEGATIVE

## 2019-04-25 LAB — CBC
HCT: 25.5 % — ABNORMAL LOW (ref 36.0–46.0)
Hemoglobin: 8.4 g/dL — ABNORMAL LOW (ref 12.0–15.0)
MCH: 30.1 pg (ref 26.0–34.0)
MCHC: 32.9 g/dL (ref 30.0–36.0)
MCV: 91.4 fL (ref 80.0–100.0)
Platelets: 505 10*3/uL — ABNORMAL HIGH (ref 150–400)
RBC: 2.79 MIL/uL — ABNORMAL LOW (ref 3.87–5.11)
RDW: 13.2 % (ref 11.5–15.5)
WBC: 13.8 10*3/uL — ABNORMAL HIGH (ref 4.0–10.5)
nRBC: 0.1 % (ref 0.0–0.2)

## 2019-04-25 LAB — PROTIME-INR
INR: 1.2 (ref 0.8–1.2)
Prothrombin Time: 14.8 seconds (ref 11.4–15.2)

## 2019-04-25 LAB — LACTIC ACID, PLASMA: Lactic Acid, Venous: 1.4 mmol/L (ref 0.5–1.9)

## 2019-04-25 LAB — APTT: aPTT: 37 seconds — ABNORMAL HIGH (ref 24–36)

## 2019-04-25 MED ORDER — MIDAZOLAM HCL 2 MG/2ML IJ SOLN
INTRAMUSCULAR | Status: AC
  Filled 2019-04-25: qty 4

## 2019-04-25 MED ORDER — MIDAZOLAM HCL 2 MG/2ML IJ SOLN
INTRAMUSCULAR | Status: AC | PRN
  Administered 2019-04-25 (×2): 1 mg via INTRAVENOUS

## 2019-04-25 MED ORDER — PIPERACILLIN-TAZOBACTAM 3.375 G IVPB
3.3750 g | Freq: Three times a day (TID) | INTRAVENOUS | Status: DC
  Administered 2019-04-25: 3.375 g via INTRAVENOUS
  Filled 2019-04-25: qty 50

## 2019-04-25 MED ORDER — PIPERACILLIN-TAZOBACTAM 3.375 G IVPB
3.3750 g | Freq: Four times a day (QID) | INTRAVENOUS | Status: DC
  Administered 2019-04-25 – 2019-05-01 (×25): 3.375 g via INTRAVENOUS
  Filled 2019-04-25 (×25): qty 50

## 2019-04-25 MED ORDER — FENTANYL CITRATE (PF) 100 MCG/2ML IJ SOLN
INTRAMUSCULAR | Status: AC
  Filled 2019-04-25: qty 4

## 2019-04-25 MED ORDER — SODIUM CHLORIDE 0.9% FLUSH
5.0000 mL | Freq: Three times a day (TID) | INTRAVENOUS | Status: DC
  Administered 2019-04-25 – 2019-05-04 (×25): 5 mL

## 2019-04-25 MED ORDER — FENTANYL CITRATE (PF) 100 MCG/2ML IJ SOLN
INTRAMUSCULAR | Status: AC | PRN
  Administered 2019-04-25 (×2): 50 ug via INTRAVENOUS

## 2019-04-25 MED ORDER — FLUMAZENIL 0.5 MG/5ML IV SOLN
INTRAVENOUS | Status: AC
  Filled 2019-04-25: qty 5

## 2019-04-25 MED ORDER — NALOXONE HCL 0.4 MG/ML IJ SOLN
INTRAMUSCULAR | Status: AC
  Filled 2019-04-25: qty 1

## 2019-04-25 NOTE — Sedation Documentation (Signed)
Vital signs stable. 

## 2019-04-25 NOTE — Sedation Documentation (Signed)
Patient arrived in CT. Dr Laurence Ferrari speaking with pt. Pt is alert and oriented, consent signed. Pt is npo

## 2019-04-25 NOTE — Sedation Documentation (Signed)
Vital signs stable. MD in room, procedure started

## 2019-04-25 NOTE — Sedation Documentation (Signed)
Pt moaning during drain placement

## 2019-04-25 NOTE — Progress Notes (Signed)
Subjective: Patient reports she is feeling better and her abdominal pain is improved.  Denies nausea this am but she is NPO for interventional radiology procedure later this morning.     Has voided.  Still having foul smelling vaginal discharge that is dark.  This has been present since Wednesday.  Objective: I have reviewed patient's vital signs, intake and output, medications and labs. Vitals:   04/25/19 0101 04/25/19 0449  BP: 125/66 107/63  Pulse: 87 80  Resp: (!) 21 (!) 22  Temp: 99.4 F (37.4 C) 99.2 F (37.3 C)  SpO2: 99% 100%   UOP since ER admission: 700cc  General: alert and cooperative Resp: clear to auscultation bilaterally Cardio: regular rate and rhythm, S1, S2 normal, no murmur, click, rub or gallop GI: abnormal findings:  increased bowel sounds, diffuse tenderness with guarding, distended Extremities: extremities normal, atraumatic, no cyanosis or edema Vaginal Bleeding: moderate vaginal drainage that is dark and malodorous Inc:  C/D/I  Assessment/Plan: POD #12 from TAH/bilateral salpingectomy, cystoscopy with pelvic abscesses On Zosyn 3.375mg  IV q 6 hours Awaiting IR consultaiton for percutaneous drainage of pelvic abscesses Continue NPO status and IVF until after procedure.  Will reassess NPO status after that time. qam CBC and BMP   LOS: 1 day    Megan Salon 04/25/2019, 7:57 AM

## 2019-04-25 NOTE — Procedures (Signed)
Interventional Radiology Procedure Note  Procedure: Placement of a 29F drain into pelvic fluid and gas collection via LEFT transgluteal approach.  Aspiration yields 40 mL thick, brownish-red foul smelling purulent fluid.  Drain left to JP bulb.   Complications: None  Estimated Blood Loss: None  Recommendations: - Drain to JP - Flush Q shift - Pt needs drain injection prior to removal  Signed,  Criselda Peaches, MD

## 2019-04-25 NOTE — Sedation Documentation (Signed)
Patient is resting comfortably. 

## 2019-04-25 NOTE — Progress Notes (Signed)
Subjective: Patient reports good pain control.  Reports she's passed gas several times today.  Reports she is hungry and would like to eat.  Procedure with IR went without any events.  Had 40cc thick red/purulent drainage with procedure and has additional drainage in drain.  Voiding.  Still having vaginal drainage that looks like what is in JP drain.  Objective: I have reviewed patient's vital signs, intake and output, medications and labs.  General: alert and cooperative Resp: clear to auscultation bilaterally Cardio: regular rate and rhythm, S1, S2 normal, no murmur, click, rub or gallop GI: abnormal findings:  BS present but rare, mild decrease in distention, tender to palpation, +guarding and incision: clean, dry and intact Extremities: extremities normal, atraumatic, no cyanosis or edema Vaginal Bleeding: moderate SCDs on and working   Assessment/Plan: POD #12 from TAH/bilateral salpingectomy Now with pelvic abscesses S/p percutaneous drainage of abscess  Continue Zosyn q 6 CBC and BMP in am Continue IVF Encourage ambulation Continue Toradol  LOS: 1 day    Megan Salon 04/25/2019, 6:53 PM

## 2019-04-26 DIAGNOSIS — R109 Unspecified abdominal pain: Secondary | ICD-10-CM | POA: Diagnosis not present

## 2019-04-26 LAB — CBC
HCT: 21.7 % — ABNORMAL LOW (ref 36.0–46.0)
Hemoglobin: 7.1 g/dL — ABNORMAL LOW (ref 12.0–15.0)
MCH: 30.5 pg (ref 26.0–34.0)
MCHC: 32.7 g/dL (ref 30.0–36.0)
MCV: 93.1 fL (ref 80.0–100.0)
Platelets: 439 10*3/uL — ABNORMAL HIGH (ref 150–400)
RBC: 2.33 MIL/uL — ABNORMAL LOW (ref 3.87–5.11)
RDW: 13.6 % (ref 11.5–15.5)
WBC: 14.3 10*3/uL — ABNORMAL HIGH (ref 4.0–10.5)
nRBC: 0 % (ref 0.0–0.2)

## 2019-04-26 LAB — BASIC METABOLIC PANEL
Anion gap: 10 (ref 5–15)
BUN: 16 mg/dL (ref 6–20)
CO2: 25 mmol/L (ref 22–32)
Calcium: 8 mg/dL — ABNORMAL LOW (ref 8.9–10.3)
Chloride: 99 mmol/L (ref 98–111)
Creatinine, Ser: 0.96 mg/dL (ref 0.44–1.00)
GFR calc Af Amer: >60 mL/min (ref >60)
GFR calc non Af Amer: >60 mL/min (ref >60)
Glucose, Bld: 119 mg/dL — ABNORMAL HIGH (ref 70–99)
Potassium: 2.3 mmol/L — CL (ref 3.5–5.1)
Sodium: 134 mmol/L — ABNORMAL LOW (ref 135–145)

## 2019-04-26 LAB — MAGNESIUM: Magnesium: 2.1 mg/dL (ref 1.7–2.4)

## 2019-04-26 LAB — PREPARE RBC (CROSSMATCH)

## 2019-04-26 LAB — HEMOGLOBIN AND HEMATOCRIT, BLOOD
HCT: 31.5 % — ABNORMAL LOW (ref 36.0–46.0)
Hemoglobin: 10.3 g/dL — ABNORMAL LOW (ref 12.0–15.0)

## 2019-04-26 LAB — POTASSIUM: Potassium: 3.4 mmol/L — ABNORMAL LOW (ref 3.5–5.1)

## 2019-04-26 MED ORDER — KCL IN DEXTROSE-NACL 40-5-0.9 MEQ/L-%-% IV SOLN
INTRAVENOUS | Status: DC
  Administered 2019-04-26 – 2019-05-01 (×5): via INTRAVENOUS
  Filled 2019-04-26 (×7): qty 1000

## 2019-04-26 MED ORDER — DIPHENHYDRAMINE HCL 50 MG/ML IJ SOLN
25.0000 mg | Freq: Once | INTRAMUSCULAR | Status: AC
  Administered 2019-04-26: 25 mg via INTRAVENOUS
  Filled 2019-04-26: qty 1

## 2019-04-26 MED ORDER — POTASSIUM CHLORIDE CRYS ER 20 MEQ PO TBCR
40.0000 meq | EXTENDED_RELEASE_TABLET | ORAL | Status: AC
  Administered 2019-04-26 (×3): 40 meq via ORAL
  Filled 2019-04-26 (×3): qty 2

## 2019-04-26 MED ORDER — POTASSIUM CHLORIDE 10 MEQ/100ML IV SOLN
10.0000 meq | INTRAVENOUS | Status: DC
  Filled 2019-04-26: qty 100

## 2019-04-26 MED ORDER — ACETAMINOPHEN 325 MG PO TABS
650.0000 mg | ORAL_TABLET | Freq: Once | ORAL | Status: AC
  Administered 2019-04-26: 650 mg via ORAL
  Filled 2019-04-26: qty 2

## 2019-04-26 MED ORDER — SODIUM CHLORIDE 0.9% IV SOLUTION
Freq: Once | INTRAVENOUS | Status: AC
  Administered 2019-04-26: 12:00:00 via INTRAVENOUS

## 2019-04-26 NOTE — Progress Notes (Signed)
CRITICAL VALUE ALERT  Critical Value:  Potassium 2.3  Date & Time Notied:  04/25/2019 , 0424 am  Provider Notified: Dr. Hale Bogus  Orders Received/Actions taken: awaiting

## 2019-04-26 NOTE — Progress Notes (Signed)
Subjective: Patient reports increased abdominal pressure that is low like she needs to have a BM.  Has been passing more gas.  Abdomen is softer and there is less pain.  Had regular diet last night.  Voiding well.  AM potassium level was abnormal.  Fluids were changed and IV K was ordered.  Pt adamantly refuses this.  She and I discussed taking orally.  She is aware of the importance of taking the potassium.    Hb also discussed.  Transfusion x 2 recommended.  Risks of HIV and Hep C specifically discussed.  Pt does give verbal consent for this.  Aware will need to check blood work after this has been administered.    Objective: I have reviewed patient's vital signs, intake and output, medications and labs.  Vitals:   04/25/19 2053 04/26/19 0518  BP: 101/74 (!) 107/57  Pulse: 73 60  Resp: 20 15  Temp: 98.8 F (37.1 C) 98.3 F (36.8 C)  SpO2: 99% 98%   All voids are not being recorded but at least 1200 cc UOP  General: alert, cooperative and no distress Resp: clear to auscultation bilaterally Cardio: regular rate and rhythm, S1, S2 normal, no murmur, click, rub or gallop GI: abnormal findings:  increased normal BS, much softer in upper quadrants and there is definitely less tenderness with decrease guarding and incision: clean, dry and intact Extremities: extremities normal, atraumatic, no cyanosis or edema Vaginal Bleeding: none and and drainage has significantly decreased  Assessment/Plan: POD #13 from TAH/bilateral salpingectomy with post op abscess and s/p percutaneous drainage of abscesses yesterday, post op procedure Day #1 Anemia Hypokalemia  Will change fluids and add KCL as well as oral K Repeat BMD in 12 hours Transfuse 2 units.  Pretreat with benadryl and tylenol May need suppository   LOS: 2 days    Megan Salon 04/26/2019, 8:08 AM

## 2019-04-26 NOTE — Progress Notes (Signed)
Subjective: Patient reports no significant change except she did have a loose bowel movement and this relieved some pelvic pressure that she's been having.  There is still pressure but this helped.  No nausea.  Voiding.  Passing flatus.  Objective: I have reviewed patient's vital signs, intake and output, medications and labs. Vitals:   04/26/19 1627 04/26/19 1854  BP: 118/70 134/74  Pulse: 65 67  Resp: 16 17  Temp: 98.4 F (36.9 C) 98.1 F (36.7 C)  SpO2: 100% 100%    General: alert, cooperative and no distress Resp: clear to auscultation bilaterally Cardio: regular rate and rhythm, S1, S2 normal, no murmur, click, rub or gallop GI: abnormal findings:  active bowel sounds, still with distension and about the same and incision: clean, dry and intact Extremities: extremities normal, atraumatic, no cyanosis or edema Vaginal Bleeding: none and minimal discharge   Assessment/Plan: Post op day #13 from TAH/bilateral salpignectomy Now with pelvic abscesses H/o hypertension Anemia s/p transfusion x 2 Hypokalemia s/p oral potassium replacement  Potassium level and H&H pending Continue Zosyn Continue SCDs   LOS: 2 days    Megan Salon 04/26/2019, 7:16 PM

## 2019-04-27 LAB — TYPE AND SCREEN
ABO/RH(D): B POS
Antibody Screen: NEGATIVE
Unit division: 0
Unit division: 0

## 2019-04-27 LAB — CBC
HCT: 29.4 % — ABNORMAL LOW (ref 36.0–46.0)
Hemoglobin: 9.7 g/dL — ABNORMAL LOW (ref 12.0–15.0)
MCH: 29.8 pg (ref 26.0–34.0)
MCHC: 33 g/dL (ref 30.0–36.0)
MCV: 90.5 fL (ref 80.0–100.0)
Platelets: 460 10*3/uL — ABNORMAL HIGH (ref 150–400)
RBC: 3.25 MIL/uL — ABNORMAL LOW (ref 3.87–5.11)
RDW: 14.9 % (ref 11.5–15.5)
WBC: 13.9 10*3/uL — ABNORMAL HIGH (ref 4.0–10.5)
nRBC: 0 % (ref 0.0–0.2)

## 2019-04-27 LAB — BASIC METABOLIC PANEL
Anion gap: 8 (ref 5–15)
BUN: 10 mg/dL (ref 6–20)
CO2: 23 mmol/L (ref 22–32)
Calcium: 8.2 mg/dL — ABNORMAL LOW (ref 8.9–10.3)
Chloride: 107 mmol/L (ref 98–111)
Creatinine, Ser: 0.85 mg/dL (ref 0.44–1.00)
GFR calc Af Amer: >60 mL/min (ref >60)
GFR calc non Af Amer: >60 mL/min (ref >60)
Glucose, Bld: 109 mg/dL — ABNORMAL HIGH (ref 70–99)
Potassium: 3.2 mmol/L — ABNORMAL LOW (ref 3.5–5.1)
Sodium: 138 mmol/L (ref 135–145)

## 2019-04-27 LAB — BPAM RBC
Blood Product Expiration Date: 202011152359
Blood Product Expiration Date: 202011152359
ISSUE DATE / TIME: 202010251236
ISSUE DATE / TIME: 202010251604
Unit Type and Rh: 7300
Unit Type and Rh: 7300

## 2019-04-27 MED ORDER — OXYCODONE HCL 5 MG PO TABS
5.0000 mg | ORAL_TABLET | ORAL | Status: DC | PRN
  Administered 2019-04-28 (×2): 10 mg via ORAL
  Administered 2019-04-28: 5 mg via ORAL
  Administered 2019-04-28 – 2019-05-01 (×11): 10 mg via ORAL
  Administered 2019-05-01: 5 mg via ORAL
  Administered 2019-05-01: 10 mg via ORAL
  Administered 2019-05-01 – 2019-05-02 (×2): 5 mg via ORAL
  Administered 2019-05-02: 10 mg via ORAL
  Administered 2019-05-02 (×3): 5 mg via ORAL
  Administered 2019-05-03 (×2): 10 mg via ORAL
  Administered 2019-05-03: 5 mg via ORAL
  Administered 2019-05-04 (×3): 10 mg via ORAL
  Filled 2019-04-27: qty 1
  Filled 2019-04-27: qty 2
  Filled 2019-04-27: qty 1
  Filled 2019-04-27 (×3): qty 2
  Filled 2019-04-27 (×2): qty 1
  Filled 2019-04-27 (×2): qty 2
  Filled 2019-04-27: qty 1
  Filled 2019-04-27: qty 2
  Filled 2019-04-27: qty 1
  Filled 2019-04-27 (×5): qty 2
  Filled 2019-04-27 (×2): qty 1
  Filled 2019-04-27: qty 2
  Filled 2019-04-27: qty 1
  Filled 2019-04-27 (×4): qty 2
  Filled 2019-04-27: qty 1
  Filled 2019-04-27 (×2): qty 2

## 2019-04-27 MED ORDER — ACETAMINOPHEN 325 MG PO TABS: 650.0000 mg | ORAL_TABLET | ORAL | Status: DC | PRN

## 2019-04-27 MED ORDER — DIPHENHYDRAMINE HCL 50 MG/ML IJ SOLN: 25.0000 mg | Freq: Four times a day (QID) | INTRAMUSCULAR | Status: DC | PRN

## 2019-04-27 NOTE — Progress Notes (Signed)
Referring Physician(s): Hale Bogus  Supervising Physician: Corrie Mckusick  Patient Status:  [facility] - In-pt  Chief Complaint: Pelvic pain   Subjective: Pt sore at left transgluteal drain site as expected; denies N/V; has had BM, urinating ok   Allergies: Tramadol and Vicodin [hydrocodone-acetaminophen]  Medications: Prior to Admission medications   Medication Sig Start Date End Date Taking? Authorizing Provider  ferrous sulfate 325 (65 FE) MG tablet Take 1 tablet (325 mg total) by mouth 2 (two) times daily with a meal. Patient taking differently: Take 325 mg by mouth 2 (two) times daily as needed (feels cold or sleepy).  09/11/17  Yes Janith Lima, MD  ibuprofen (ADVIL) 200 MG tablet Take 200 mg by mouth every 6 (six) hours as needed for moderate pain.   Yes [provider]  loratadine (CLARITIN) 10 MG tablet Take 10 mg by mouth daily as needed for allergies.   Yes [provider]  oxycodone (OXY-IR) 5 MG capsule Take 1 capsule (5 mg total) by mouth every 4 (four) hours as needed. Patient taking differently: Take 5 mg by mouth every 4 (four) hours as needed for pain.  04/14/19  Yes Princess Bruins, MD  rosuvastatin (CRESTOR) 5 MG tablet Take 1 tablet (5 mg total) by mouth daily. 08/20/18  Yes Janith Lima, MD  spironolactone (ALDACTONE) 25 MG tablet Take 1 tablet (25 mg total) by mouth daily. 03/20/18  Yes Janith Lima, MD  VITAMIN E PO Take 3 tablets by mouth daily.    Yes [provider]  zolpidem (AMBIEN) 10 MG tablet TAKE 1 TABLET BY MOUTH AT BEDTIME AS NEEDED FOR SLEEP Patient taking differently: Take 10 mg by mouth at bedtime.  01/07/19  Yes Janith Lima, MD     Vital Signs: BP 136/69 (BP Location: Left Arm)    Pulse 65    Temp 98.6 F (37 C) (Oral)    Resp 18    Ht 5\' 6"  (1.676 m)    Wt 160 lb (72.6 kg)    LMP 11/03/2018    SpO2 100%    BMI 25.82 kg/m   Physical Exam awake/alert; left TG drain intact, insertion site clean and dry,  mild- mod tender, output 130 cc yesterday, 30 cc turbid brown fluid in JP bulb; drain flushed without difficulty  Imaging: Ct Abdomen Pelvis Wo Contrast  Result Date: 04/24/2019 CLINICAL DATA:  48 year old female with recent hysterectomy on 04/13/2019. Foul-smelling urine and leukocytosis. EXAM: CT ABDOMEN AND PELVIS WITHOUT CONTRAST TECHNIQUE: Multidetector CT imaging of the abdomen and pelvis was performed following the standard protocol without IV contrast. COMPARISON:  None. FINDINGS: Evaluation of this exam is limited in the absence of intravenous contrast. Lower chest: The visualized lung bases are clear. There is hypoattenuation of the cardiac blood pool suggestive of a degree of anemia. Clinical correlation is recommended. Hepatobiliary: Several small hepatic hypodense lesions measuring up to 1.5 cm in the left lobe are not characterized on this noncontrast study but may represent cysts or hemangioma. No intrahepatic biliary ductal dilatation. There is small amount of sludge and several small stones in the gallbladder. No pericholecystic fluid or evidence of acute cholecystitis by CT. Pancreas: Unremarkable. No pancreatic ductal dilatation or surrounding inflammatory changes. Spleen: Normal in size without focal abnormality. Adrenals/Urinary Tract: The adrenal glands are unremarkable. There is no hydronephrosis or nephrolithiasis on either side. The visualized ureters appear unremarkable. The urinary bladder is partially distended. There is diffuse thickened appearance of the bladder wall with  perivesical stranding, likely reactive to inflammatory changes of the pelvis. Correlation with urinalysis recommended to exclude cystitis. Stomach/Bowel: Evaluation of the bowel is limited in the absence of oral contrast. There is moderate amount of stool throughout the colon. No evidence of bowel obstruction. The appendix is unremarkable as visualized. Vascular/Lymphatic: Moderate atherosclerotic calcification  of the aorta and iliac arteries. The vasculature is otherwise grossly unremarkable on this noncontrast CT. No portal venous gas. Mildly enlarged pericecal lymph nodes. Reproductive: Hysterectomy. Ill-defined soft tissue density in the left hemipelvis anterior to the psoas muscle likely represents the left ovary. A subcentimeter calcific focus within this area likely represents a calcified corpus albicans. Other: Loculated appearing fluid collection in the posterior pelvis measures approximately 8.6 x 4.8 cm (series 2, image 71) most consistent with an infected collection/developing abscess. An additional ill-defined collection in the left anterior lower abdomen/pelvis measures approximately 7.7 x 3.8 cm in greatest axial dimensions (series 2, image 61) anterior to the left ovary. There is a pocket of loculated extraluminal appearing air in the anterior pelvis (series 2, image 61) measuring 3.7 x 4.2 cm likely air within an infected collection. Musculoskeletal: Postsurgical changes of the anterior pelvic wall. There is a 1.7 x 11 cm fluid collection in the subcutaneous soft tissues of the anterior pelvic wall along the surgical incision. This may represent a seroma but concerning for an infected fluid. Stranding of the subcutaneous soft tissues of the anterior abdominal wall and periumbilical region. No acute osseous pathology. IMPRESSION: 1. Postsurgical changes of hysterectomy with several loculated fluid collection in the lower abdomen and pelvis most consistent with developing abscesses. 2. Postsurgical changes of the anterior pelvic wall with seroma or infected fluid in the subcutaneous soft tissues along the surgical incision. 3. No hydronephrosis or nephrolithiasis. 4. Thickened appearance of the bladder wall with perivesical stranding likely reactive to inflammatory changes of the pelvis. Correlation with urinalysis recommended to exclude cystitis. 5. No bowel obstruction. Normal appendix. 6. Aortic  Atherosclerosis (ICD10-I70.0). These results were called by telephone at the time of interpretation on 04/24/2019 at 9:03 pm to provider Carmin Muskrat , who verbally acknowledged these results. Electronically Signed   By: Anner Crete M.D.   On: 04/24/2019 21:09   Ct Image Guided Drainage By Percutaneous Catheter  Result Date: 04/25/2019 INDICATION: 48 year old female with a postoperative fluid and gas collection in the pelvic cul-de-sac status post hysterectomy. Findings are concerning for abscess. She presents for placement of a CT-guided drainage catheter. EXAM: CT-guided drain placement MEDICATIONS: The patient is currently admitted to the hospital and receiving intravenous antibiotics. The antibiotics were administered within an appropriate time frame prior to the initiation of the procedure. ANESTHESIA/SEDATION: Fentanyl 100 mcg IV; Versed 2 mg IV Moderate Sedation Time:  12 minutes The patient was continuously monitored during the procedure by the interventional radiology nurse under my direct supervision. COMPLICATIONS: None immediate. PROCEDURE: Informed written consent was obtained from the patient after a thorough discussion of the procedural risks, benefits and alternatives. All questions were addressed. Maximal Sterile Barrier Technique was utilized including caps, mask, sterile gowns, sterile gloves, sterile drape, hand hygiene and skin antiseptic. A timeout was performed prior to the initiation of the procedure. A planning axial CT scan was performed. There is a large fluid and gas collection in the pelvic cul-de-sac. A suitable skin entry site was selected allowing for a left parasacral transgluteal approach. The overlying skin was sterilely prepped and draped. Local anesthesia was attained by infiltration with 1% lidocaine. A small  dermatotomy was made. Under intermittent CT guidance, an 18 gauge trocar needle was advanced through the sacral spinous ligament and into the fluid and gas  collection. A wire was then coiled in the collection. The skin tract was dilated to 29 [place] and a Cook 12 [place] all-purpose drainage catheter was advanced over the wire and formed. Aspiration yields 40 mL of thick, foul-smelling purulent fluid. A sample was sent for Gram stain and culture. The drain was flushed and connected to JP bulb suction before being secured to the skin with 0 Prolene suture and an adhesive fixation device. Post drain placement demonstrates a well-positioned drainage catheter. There was no immediate complication and the patient tolerated the procedure well. IMPRESSION: Successful placement of a 12 French drainage catheter via left parasacral transgluteal approach. Of note, the fluid is brownish in color, thick and foul-smelling. This raises concern for feculent contamination. PLAN: 1. Maintain drain to JP bulb suction. 2. Flush drainage catheter at least once per shift. 3. Recommend drain injection prior to removal to assess for fistulous communication with the colon or rectum. Signed, Criselda Peaches, MD, [facility] Electronically Signed   By: Jacqulynn Cadet M.D.   On: 04/25/2019 18:16    Labs:  CBC: Recent Labs    04/24/19 1753 04/25/19 0110 04/26/19 0342 04/26/19 1923 04/27/19 0325  WBC 18.8* 13.8* 14.3*  --  13.9*  HGB 9.3* 8.4* 7.1* 10.3* 9.7*  HCT 27.5* 25.5* 21.7* 31.5* 29.4*  PLT 546* 505* 439*  --  460*    COAGS: Recent Labs    04/25/19 0110  INR 1.2  APTT 37*    BMP: Recent Labs    04/08/19 1406 04/24/19 1753 04/26/19 0342 04/26/19 1923 04/27/19 0325  NA 142 135 134*  --  138  K 3.9 3.4* 2.3* 3.4* 3.2*  CL 106 96* 99  --  107  CO2 27 26 25   --  23  GLUCOSE 96 108* 119*  --  109*  BUN 9 25* 16  --  10  CALCIUM 9.6 9.0 8.0*  --  8.2*  CREATININE 0.94 1.39* 0.96  --  0.85  GFRNONAA >60 44* >60  --  >60  GFRAA >60 51* >60  --  >60    LIVER FUNCTION TESTS: Recent Labs     08/19/18 1626 04/24/19 1753  BILITOT 0.3 1.9*  AST 12 39  ALT 5 23  ALKPHOS 49 85  PROT 7.7 8.3*  ALBUMIN 4.1 2.9*    Assessment and Plan: Pt s/p TAH/BSO  04/13/19 secondary to large symptomatic uterine fibroids; now with postop gas/fluid collection in pelvic cul-de-sac, status post drain placement on 04/25/2019; afebrile, WBC 13.9 down from 14.3, hemoglobin 9.7, drain fluid cultures pending, creatinine normal; continue drain irrigation and output monitoring; once output less than 10 to 15 cc/day (excluding flush amount) for  2 to 3 days, obtain follow-up CT; will need drain injection before removal as well.   Electronically Signed: D. Rowe Robert, PA-C 04/27/2019, 4:09 PM   I spent a total of 15 minutes at the the patient's bedside AND on the patient's hospital floor or unit, greater than 50% of which was counseling/coordinating care for pelvic fluid collection drain    Patient ID: Samantha Reed, female   DOB: 1971-01-04, 48 y.o.   MRN: [id number]

## 2019-04-27 NOTE — Progress Notes (Signed)
Spoke with Dellis Filbert MD about patients progress, patient made aware. Norlene Duel RN, BSN

## 2019-04-27 NOTE — Progress Notes (Signed)
Subjective:  Reports loose stool last night and passing gas.  Tolerating fluids but has not eaten much.  Discomfort from the drain is most bothering.  Objective: Afebrile vital signs are stable Lungs clear Cardiac regular rate no rubs murmurs or gallops Abdomen soft with active bowel sounds.  Mild distention.  Minimal tenderness.  Incision dry intact Vaginal drainage reported scant. Drain with purulent drainage  Assessment/plan:  1. Pelvic abscess postop day #14 status post TAH bilateral salpingectomies.  48 hours of antibiotics and CT directed catheter drainage.  40 cc purulent drainage over the last 12 hours.  Remains afebrile.  Morning WBC 13.  Hemoglobin 9.7.  Transfused 2 units yesterday.  Recheck CBC in the morning.  Continue with antibiotics.  Increase diet today. 2. Hypokalemia.  Potassium 3.2 this morning.  Continue potassium in her IV at 40 mEq.  Will recheck tomorrow morning.

## 2019-04-28 ENCOUNTER — Inpatient Hospital Stay (HOSPITAL_COMMUNITY): Payer: 59

## 2019-04-28 ENCOUNTER — Encounter: Payer: Self-pay | Admitting: Anesthesiology

## 2019-04-28 ENCOUNTER — Telehealth: Payer: Self-pay | Admitting: *Deleted

## 2019-04-28 LAB — CBC
HCT: 27.6 % — ABNORMAL LOW (ref 36.0–46.0)
Hemoglobin: 8.9 g/dL — ABNORMAL LOW (ref 12.0–15.0)
MCH: 30.1 pg (ref 26.0–34.0)
MCHC: 32.2 g/dL (ref 30.0–36.0)
MCV: 93.2 fL (ref 80.0–100.0)
Platelets: 437 10*3/uL — ABNORMAL HIGH (ref 150–400)
RBC: 2.96 MIL/uL — ABNORMAL LOW (ref 3.87–5.11)
RDW: 14.7 % (ref 11.5–15.5)
WBC: 14.3 10*3/uL — ABNORMAL HIGH (ref 4.0–10.5)
nRBC: 0 % (ref 0.0–0.2)

## 2019-04-28 LAB — BASIC METABOLIC PANEL
Anion gap: 8 (ref 5–15)
BUN: 5 mg/dL — ABNORMAL LOW (ref 6–20)
CO2: 22 mmol/L (ref 22–32)
Calcium: 7.9 mg/dL — ABNORMAL LOW (ref 8.9–10.3)
Chloride: 106 mmol/L (ref 98–111)
Creatinine, Ser: 0.76 mg/dL (ref 0.44–1.00)
GFR calc Af Amer: >60 mL/min (ref >60)
GFR calc non Af Amer: >60 mL/min (ref >60)
Glucose, Bld: 104 mg/dL — ABNORMAL HIGH (ref 70–99)
Potassium: 3.3 mmol/L — ABNORMAL LOW (ref 3.5–5.1)
Sodium: 136 mmol/L (ref 135–145)

## 2019-04-28 MED ORDER — IOHEXOL 300 MG/ML  SOLN
100.0000 mL | Freq: Once | INTRAMUSCULAR | Status: AC | PRN
  Administered 2019-04-28: 100 mL via INTRAVENOUS

## 2019-04-28 NOTE — Telephone Encounter (Signed)
I spoke with Dr.Lavoie in her office and was told okay to place correct order for Ct abdomen and pelvis. I called CT at [facility] and the tech placed the correct order.

## 2019-04-28 NOTE — Telephone Encounter (Signed)
Samantha Reed called from [facility] at [facility] regarding order for CT scan abdomen and pelvis order by Dr.Miller on 04/24/19. Patient was admitted to ER on 04/24/19 currently inpatient Dr.Miller was on call and ordered CT scan abdomen/pelvis with and without contrast to follow up abscess drainage. However the wrong order was placed by Dr. Sabra Heck CT called to get the correct order and was told by Dr. Sabra Heck nurse they will need to call you to get correct order. Okay to place correct order? Please advise

## 2019-04-28 NOTE — Progress Notes (Signed)
Patient c/o not having pain relief with scheduled Toradol and PRN morphine, on call MD notified and ordered more PRN medications, MD was made aware of patients allergies, patient was educated on new PRNs and was okay with them. Norlene Duel RN, BSN

## 2019-04-28 NOTE — Progress Notes (Signed)
POD # 15 /Admission Day #4 TAH/Bilateral Salpingectomy/PO Pelvic Abscess IV ABTx/Drain placement/Blood transfusion  Subjective: Patient reports tolerating PO, + flatus, + BM and no problems voiding.    Objective: I have reviewed patient's vital signs, intake and output, medications and labs.  Vitals:   04/28/19 0531 04/28/19 1413  BP: 131/78 123/62  Pulse: 60 (!) 55  Resp: 18 18  Temp: 98.2 F (36.8 C) 98.6 F (37 C)  SpO2: 96% 99%    10/26 0701 - 10/27 0700 In: 2799.7 [P.O.:420; I.V.:2073.5; IV Piggyback:296.3] Out: 1045 [Urine:1000; Drains:45]  Total I/O In: 1722.7 [P.O.:840; I.V.:732.7; IV Piggyback:150] Out: 715 [Urine:700; Drains:15]   Lab Results  Component Value Date   WBC 14.3 (H) 04/28/2019   HGB 8.9 (L) 04/28/2019   HCT 27.6 (L) 04/28/2019   MCV 93.2 04/28/2019   PLT 437 (H) 04/28/2019   Lungs clear bilaterally Heart RCR  Abdomen: Soft, mildly distended.  BS present.  No high pitch.  Mildly tender.  Incision intact, no erythema, no drainage.  Minimal serous fluid in drain Tender at drain insertion site   Assessment/Plan:  Pelvic abscess responding to IV ABTX and drainage with drain currently draining minimal serous fluid.  Resolving ileus, tolerating PO diet and had 2 BMs today and good passage of gas.  Continues to experience pain, especially at drain site which appears intact, but is located at a pressure point.  Hb 8.9 post blood transfusion, mild decrease wnl post transfusion.  No evidence of active bleeding.  Will proceed with a f/u CT scan to evaluate drainage of the abscess as patient is eager to remove the drains.  Will consult with intervention radiologist for recommendations.  Princess Bruins, MD 6:56 PM 04/28/2019

## 2019-04-29 ENCOUNTER — Inpatient Hospital Stay (HOSPITAL_COMMUNITY): Payer: 59

## 2019-04-29 DIAGNOSIS — K651 Peritoneal abscess: Secondary | ICD-10-CM

## 2019-04-29 LAB — CBC
HCT: 28.6 % — ABNORMAL LOW (ref 36.0–46.0)
Hemoglobin: 9.3 g/dL — ABNORMAL LOW (ref 12.0–15.0)
MCH: 30.5 pg (ref 26.0–34.0)
MCHC: 32.5 g/dL (ref 30.0–36.0)
MCV: 93.8 fL (ref 80.0–100.0)
Platelets: 437 10*3/uL — ABNORMAL HIGH (ref 150–400)
RBC: 3.05 MIL/uL — ABNORMAL LOW (ref 3.87–5.11)
RDW: 14.4 % (ref 11.5–15.5)
WBC: 14.8 10*3/uL — ABNORMAL HIGH (ref 4.0–10.5)
nRBC: 0 % (ref 0.0–0.2)

## 2019-04-29 LAB — BASIC METABOLIC PANEL
Anion gap: 7 (ref 5–15)
BUN: <5 mg/dL — ABNORMAL LOW (ref 6–20)
CO2: 22 mmol/L (ref 22–32)
Calcium: 7.9 mg/dL — ABNORMAL LOW (ref 8.9–10.3)
Chloride: 106 mmol/L (ref 98–111)
Creatinine, Ser: 0.75 mg/dL (ref 0.44–1.00)
GFR calc Af Amer: >60 mL/min (ref >60)
GFR calc non Af Amer: >60 mL/min (ref >60)
Glucose, Bld: 120 mg/dL — ABNORMAL HIGH (ref 70–99)
Potassium: 3.4 mmol/L — ABNORMAL LOW (ref 3.5–5.1)
Sodium: 135 mmol/L (ref 135–145)

## 2019-04-29 MED ORDER — POTASSIUM CHLORIDE CRYS ER 20 MEQ PO TBCR
20.0000 meq | EXTENDED_RELEASE_TABLET | Freq: Every day | ORAL | Status: AC
  Administered 2019-04-29 – 2019-05-01 (×3): 20 meq via ORAL
  Filled 2019-04-29 (×2): qty 1

## 2019-04-29 MED ORDER — FUROSEMIDE 10 MG/ML IJ SOLN
40.0000 mg | Freq: Once | INTRAMUSCULAR | Status: AC
  Administered 2019-04-29: 40 mg via INTRAVENOUS
  Filled 2019-04-29: qty 4

## 2019-04-29 NOTE — Progress Notes (Signed)
Referring Physician(s): Miller,M/Lavoie,M  Supervising Physician: Jacqulynn Cadet  Patient Status:  [facility] - In-pt  Chief Complaint: Abdominal/pelvic pain/abscesses   Subjective: Patient continues to have some abdominal /pelvic discomfort, few loose stools.  Has ambulated.  Denies fever, respiratory issues, nausea vomiting or bleeding   Allergies: Tramadol and Vicodin [hydrocodone-acetaminophen]  Medications: Prior to Admission medications   Medication Sig Start Date End Date Taking? Authorizing Provider  ferrous sulfate 325 (65 FE) MG tablet Take 1 tablet (325 mg total) by mouth 2 (two) times daily with a meal. Patient taking differently: Take 325 mg by mouth 2 (two) times daily as needed (feels cold or sleepy).  09/11/17  Yes Janith Lima, MD  ibuprofen (ADVIL) 200 MG tablet Take 200 mg by mouth every 6 (six) hours as needed for moderate pain.   Yes [provider]  loratadine (CLARITIN) 10 MG tablet Take 10 mg by mouth daily as needed for allergies.   Yes [provider]  oxycodone (OXY-IR) 5 MG capsule Take 1 capsule (5 mg total) by mouth every 4 (four) hours as needed. Patient taking differently: Take 5 mg by mouth every 4 (four) hours as needed for pain.  04/14/19  Yes Princess Bruins, MD  rosuvastatin (CRESTOR) 5 MG tablet Take 1 tablet (5 mg total) by mouth daily. 08/20/18  Yes Janith Lima, MD  spironolactone (ALDACTONE) 25 MG tablet Take 1 tablet (25 mg total) by mouth daily. 03/20/18  Yes Janith Lima, MD  VITAMIN E PO Take 3 tablets by mouth daily.    Yes [provider]  zolpidem (AMBIEN) 10 MG tablet TAKE 1 TABLET BY MOUTH AT BEDTIME AS NEEDED FOR SLEEP Patient taking differently: Take 10 mg by mouth at bedtime.  01/07/19  Yes Janith Lima, MD     Vital Signs: BP 130/62 (BP Location: Right Arm)    Pulse (!) 58    Temp 98.2 F (36.8 C) (Oral)    Resp 18    Ht 5\' 6"  (1.676 m)    Wt 160 lb (72.6 kg)    LMP 11/03/2018    SpO2  99%    BMI 25.82 kg/m   Physical Exam awake, alert.  Chest with slightly diminished breath sounds bases.  Heart with regular rate rhythm.  Abdomen soft, few bowel sounds, tender lower abdominal/pelvic region palpation; no lower extremity edema.  Left transgluteal drain intact, insertion site mildly tender, output 25 cc light brown fluid  Imaging: Ct Abdomen Pelvis W Contrast  Result Date: 04/28/2019 CLINICAL DATA:  48 year old female with recent hysterectomy on 04/13/2019 and pelvic abscesses. Status post drain placement on 04/25/2019. EXAM: CT ABDOMEN AND PELVIS WITH CONTRAST TECHNIQUE: Multidetector CT imaging of the abdomen and pelvis was performed using the standard protocol following bolus administration of intravenous contrast. CONTRAST:  110mL OMNIPAQUE IOHEXOL 300 MG/ML  SOLN COMPARISON:  CT of the abdomen pelvis dated 04/24/2019. FINDINGS: Lower chest: Partially visualized small bilateral pleural effusions and associated partial compressive atelectasis of the lower lobes. The pleural effusions are new since the CT of 04/24/2019. No intra-abdominal free air. There is diffuse mesenteric edema and inflammation which appears worsened since the prior CT. There is now a small ascites, new since the prior CT. Hepatobiliary: Several scattered hepatic hypodense lesions are not characterized on this CT. There is mild periportal edema, new since the prior CT. There is sludge and small stones within the gallbladder. There is a small pericholecystic fluid, likely related to inflammatory changes of the  mesentery and ascites. Acute cholecystitis is not entirely excluded. Clinical correlation is recommended. Pancreas: Unremarkable. No pancreatic ductal dilatation or surrounding inflammatory changes. Spleen: Normal in size without focal abnormality. Adrenals/Urinary Tract: The adrenal glands are unremarkable. There is no hydronephrosis on either side. There is symmetric enhancement and excretion of contrast by both  kidneys. The visualized ureters appear unremarkable. The fused thickened appearance of the bladder wall with perivesical stranding likely reactive to inflammatory changes of the pelvis. Cystitis is not excluded. Stomach/Bowel: Diffusely thickened small bowel as well as thickened appearance of the colon with surrounding inflammation reactive to inflammatory changes/infection of the mesentery. There is no bowel obstruction. The appendix is unremarkable as visualized. Vascular/Lymphatic: There is moderate aortoiliac atherosclerotic disease. The IVC is unremarkable. No portal venous gas. Top-normal para-aortic and retroperitoneal lymph nodes. Reproductive: Hysterectomy. Other: There has been interval placement of a pigtail drainage catheter in the posterior pelvis and near complete resolution of the loculated fluid/abscess seen on the prior CT of 04/24/2019. There is however a larger collection in the left anterior hemipelvis abutting the left ovary and extending anteriorly through a defect in the anterior peritoneum into the anterior abdominal wall medial to the left rectus muscle. This collection measures approximately 6.1 x 7.3 cm on series 2, image 59. Several small pockets of air noted within this collection. An additional smaller collection measuring approximately 3.5 x 1.3 cm noted posterior and medial to the right ovary (series 2, image 63). There is enhancement of the peritoneal surface concerning for peritonitis. There is a 2.4 x 3.0 x 11.5 cm collection in the subcutaneous soft tissues of the anterior pelvic wall along the surgical incision also concerning for an infected fluid. There is diffuse subcutaneous edema as well as edema of the abdominal wall musculature, new since the prior CT. Musculoskeletal: No acute or significant osseous findings. IMPRESSION: 1. Interval placement of a pigtail drainage catheter in the posterior pelvis with near complete resolution of the previously seen loculated fluid/abscess  in the posterior pelvis seen on the prior CT of 04/24/2019. 2. Additional loculated infected fluid/abscess noted within the pelvis. The larger collection in the left anterior pelvis extends into the anterior pelvic wall through a defect medial to the left rectus muscle. A smaller collection is also noted posterior to the right ovary. 3. Overall interval increase in the inflammatory changes of the mesentery with development of small ascites and small bilateral pleural effusions and findings concerning for peritonitis. Clinical correlation is recommended. 4. Subcutaneous fluid along the surgical incision in the anterior pelvic wall most likely an infected fluid. 5. Aortic Atherosclerosis (ICD10-I70.0). Electronically Signed   By: Anner Crete M.D.   On: 04/28/2019 19:43   Ct Image Guided Drainage By Percutaneous Catheter  Result Date: 04/25/2019 INDICATION: 48 year old female with a postoperative fluid and gas collection in the pelvic cul-de-sac status post hysterectomy. Findings are concerning for abscess. She presents for placement of a CT-guided drainage catheter. EXAM: CT-guided drain placement MEDICATIONS: The patient is currently admitted to the hospital and receiving intravenous antibiotics. The antibiotics were administered within an appropriate time frame prior to the initiation of the procedure. ANESTHESIA/SEDATION: Fentanyl 100 mcg IV; Versed 2 mg IV Moderate Sedation Time:  12 minutes The patient was continuously monitored during the procedure by the interventional radiology nurse under my direct supervision. COMPLICATIONS: None immediate. PROCEDURE: Informed written consent was obtained from the patient after a thorough discussion of the procedural risks, benefits and alternatives. All questions were addressed. Maximal Sterile Barrier Technique  was utilized including caps, mask, sterile gowns, sterile gloves, sterile drape, hand hygiene and skin antiseptic. A timeout was performed prior to the  initiation of the procedure. A planning axial CT scan was performed. There is a large fluid and gas collection in the pelvic cul-de-sac. A suitable skin entry site was selected allowing for a left parasacral transgluteal approach. The overlying skin was sterilely prepped and draped. Local anesthesia was attained by infiltration with 1% lidocaine. A small dermatotomy was made. Under intermittent CT guidance, an 18 gauge trocar needle was advanced through the sacral spinous ligament and into the fluid and gas collection. A wire was then coiled in the collection. The skin tract was dilated to 48 [place] and a Cook 12 [place] all-purpose drainage catheter was advanced over the wire and formed. Aspiration yields 40 mL of thick, foul-smelling purulent fluid. A sample was sent for Gram stain and culture. The drain was flushed and connected to JP bulb suction before being secured to the skin with 0 Prolene suture and an adhesive fixation device. Post drain placement demonstrates a well-positioned drainage catheter. There was no immediate complication and the patient tolerated the procedure well. IMPRESSION: Successful placement of a 12 French drainage catheter via left parasacral transgluteal approach. Of note, the fluid is brownish in color, thick and foul-smelling. This raises concern for feculent contamination. PLAN: 1. Maintain drain to JP bulb suction. 2. Flush drainage catheter at least once per shift. 3. Recommend drain injection prior to removal to assess for fistulous communication with the colon or rectum. Signed, Criselda Peaches, MD, [facility] Electronically Signed   By: Jacqulynn Cadet M.D.   On: 04/25/2019 18:16    Labs:  CBC: Recent Labs    04/26/19 0342 04/26/19 1923 04/27/19 0325 04/28/19 0323 04/29/19 0328  WBC 14.3*  --  13.9* 14.3* 14.8*  HGB 7.1* 10.3* 9.7* 8.9* 9.3*  HCT 21.7* 31.5* 29.4* 27.6* 28.6*  PLT 439*  --   460* 437* 437*    COAGS: Recent Labs    04/25/19 0110  INR 1.2  APTT 37*    BMP: Recent Labs    04/26/19 0342 04/26/19 1923 04/27/19 0325 04/28/19 0323 04/29/19 0328  NA 134*  --  138 136 135  K 2.3* 3.4* 3.2* 3.3* 3.4*  CL 99  --  107 106 106  CO2 25  --  23 22 22   GLUCOSE 119*  --  109* 104* 120*  BUN 16  --  10 5* <5*  CALCIUM 8.0*  --  8.2* 7.9* 7.9*  CREATININE 0.96  --  0.85 0.76 0.75  GFRNONAA >60  --  >60 >60 >60  GFRAA >60  --  >60 >60 >60    LIVER FUNCTION TESTS: Recent Labs    08/19/18 1626 04/24/19 1753  BILITOT 0.3 1.9*  AST 12 39  ALT 5 23  ALKPHOS 49 85  PROT 7.7 8.3*  ALBUMIN 4.1 2.9*    Assessment and Plan: Pt s/p TAH/BSO  04/13/19 secondary to large symptomatic uterine fibroids; now with postop gas/fluid collection in pelvic cul-de-sac, status post drain placement on 04/25/2019; afebrile, WBC 14.8 (14.3), HGB 9.3(8.9), creat nl; drain fluid cx pend; follow-up CT abdomen pelvis done yesterday revealed:   1. Interval placement of a pigtail drainage catheter in the posterior pelvis with near complete resolution of the previously seen loculated fluid/abscess in the posterior pelvis seen on the prior CT of 04/24/2019. 2. Additional loculated infected fluid/abscess noted  within the pelvis. The larger collection in the left anterior pelvis extends into the anterior pelvic wall through a defect medial to the left rectus muscle. A smaller collection is also noted posterior to the right ovary. 3. Overall interval increase in the inflammatory changes of the mesentery with development of small ascites and small bilateral pleural effusions and findings concerning for peritonitis. Clinical correlation is recommended. 4. Subcutaneous fluid along the surgical incision in the anterior pelvic wall most likely an infected fluid. 5. Aortic Atherosclerosis (ICD10-I70.0).  Imaging studies were reviewed by Dr. Laurence Ferrari; plan is for additional pelvic drain  placement on 10/29; patient will likely need injection of existing left transgluteal drain as well to r/o fistula; details/risks of above procedure, including but not limited to, internal bleeding, infection, injury to adjacent structures discussed with patient with her understanding and consent.     Electronically Signed: D. Rowe Robert, PA-C 04/29/2019, 2:27 PM   I spent a total of 20 minutes at the the patient's bedside AND on the patient's hospital floor or unit, greater than 50% of which was counseling/coordinating care for left pelvic abscess drain and planned placement of new pelvic drain    Patient ID: Samantha Reed, female   DOB: 12-12-1970, 48 y.o.   MRN: [id number]

## 2019-04-29 NOTE — Progress Notes (Addendum)
Got a call from from Dr Dellis Filbert, discussed CT scan abdomen results.  Patient seems to have fluid overload from IV fluids, she is +8 L fluid overload.  She is currently not short of breath.  Eating and drinking fine.  Recommended to change IV fluids to [facility] and try diuresis with IV Lasix 40 mg daily and potassium supplementation.  Also recommended to check BMP every day.  Repeat CT abdomen in 3 to 4 days.  Dr. Dellis Filbert is going to give IV Lasix, and call [facility] as needed.

## 2019-04-29 NOTE — Progress Notes (Signed)
POD#16/Admission Day #5 TAH/Bilateral Salpingectomy PO Pelvic abscess IV Zosyn/Drain placement/Blood transfusion  Subjective: Patient reports tolerating PO, + flatus, + BM and no problems voiding. Not able to raise the pressure as high as before with spirometry, but no SOB.  Ambulating, feels a little more energy.   Objective: I have reviewed patient's vital signs.  vital signs, intake and output, medications and labs.  Vitals:   04/28/19 2135 04/29/19 0449  BP: (!) 145/78 111/71  Pulse: (!) 55 (!) 57  Resp: 18 17  Temp: 98.7 F (37.1 C) 98.1 F (36.7 C)  SpO2: 100% 100%   I/O last 3 completed shifts: In: 4306.3 [P.O.:1860; I.V.:2181.3; Other:15; IV Piggyback:250] Out: [id number] [Urine:1650; Drains:25] No intake/output data recorded.  Overall I/O Positive balance of 800 cc.   Results for orders placed or performed during the hospital encounter of 04/24/19 (from the past 24 hour(s))  CBC     Status: Abnormal   Collection Time: 04/29/19  3:28 AM  Result Value Ref Range   WBC 14.8 (H) 4.0 - 10.5 K/uL   RBC 3.05 (L) 3.87 - 5.11 MIL/uL   Hemoglobin 9.3 (L) 12.0 - 15.0 g/dL   HCT 28.6 (L) 36.0 - 46.0 %   MCV 93.8 80.0 - 100.0 fL   MCH 30.5 26.0 - 34.0 pg   MCHC 32.5 30.0 - 36.0 g/dL   RDW 14.4 11.5 - 15.5 %   Platelets 437 (H) 150 - 400 K/uL   nRBC 0.0 0.0 - 0.2 %  Basic metabolic panel     Status: Abnormal   Collection Time: 04/29/19  3:28 AM  Result Value Ref Range   Sodium 135 135 - 145 mmol/L   Potassium 3.4 (L) 3.5 - 5.1 mmol/L   Chloride 106 98 - 111 mmol/L   CO2 22 22 - 32 mmol/L   Glucose, Bld 120 (H) 70 - 99 mg/dL   BUN <5 (L) 6 - 20 mg/dL   Creatinine, Ser 0.75 0.44 - 1.00 mg/dL   Calcium 7.9 (L) 8.9 - 10.3 mg/dL   GFR calc non Af Amer >60 >60 mL/min   GFR calc Af Amer >60 >60 mL/min   Anion gap 7 5 - 15    EXAM General: alert and cooperative Resp: clear to auscultation bilaterally Cardio: regular rate and rhythm GI: Mildly distended, soft, mildly tender.   BS present.  No high pitch sounds.  Incision well closed, no drainage, no erythema, no induration. Extremities: no edema, redness or tenderness in the calves or thighs Vaginal Bleeding: minimal  CT scan 04/28/2019:  1. Interval placement of a pigtail drainage catheter in the posterior pelvis with near complete resolution of the previously seen loculated fluid/abscess in the posterior pelvis seen on the prior CT of 04/24/2019. 2. Additional loculated infected fluid/abscess noted within the pelvis. The larger collection in the left anterior pelvis extends into the anterior pelvic wall through a defect medial to the left rectus muscle. A smaller collection is also noted posterior to the right ovary. 3. Overall interval increase in the inflammatory changes of the mesentery with development of small ascites and small bilateral pleural effusions and findings concerning for peritonitis. Clinical correlation is recommended. 4. Subcutaneous fluid along the surgical incision in the anterior pelvic wall most likely an infected fluid. 5. Aortic Atherosclerosis (ICD10-I70.0).    Assessment: Clinically mildly improved with better appetite, no N/V, gas and BMs present.  Ambulating with more energy.   Overall I/O in positive balance of 800 cc.  CT  scan showing new bilateral small pleural effusion and atelectasis.  Overall increased inflammation and small ascites. No sign of bowel obstruction.  Afebrile. Good O2 Saturation at 100% on RA.  Leukocytosis persists at 14.8.  Stable Hb at 9.3.  Good renal function with Creat at 0.75.  Vaginal vault/Cul-de-sac fluid collection/abscess well drained by the drain at that level.  A large left anterior pelvis/wall fluid collection/abscess is present per CT scan.  Plan: Will continue with Zosyn IV.  Telephone consultation with Dr Darrick Meigs Hospitalist.  Decision to start with Lasix 40 mg IV daily x 3 days.  K-Dur 20 mEq PO daily.  CMET/CBC daily.  Will repeat a CT scan  in about 3 days per clinical progression. Patient discussed with Dr Sanda Linger Intervention Radiology for management of abscess/fluid collection drainage.  Per CT scan, placement of drain successfully drained the cul-de-sac/vaginal vault fluid collection/abscess.  Will schedule patient for an anterior drain placement tomorrow for the left anterior pelvis/wall fluid collection/abscess as described in the CT scan. Will keep patient NPO tomorrow.   LOS: 5 days    Princess Bruins, MD 04/29/2019 9:25 AM

## 2019-04-30 ENCOUNTER — Inpatient Hospital Stay (HOSPITAL_COMMUNITY): Payer: 59

## 2019-04-30 ENCOUNTER — Encounter (HOSPITAL_COMMUNITY): Payer: Self-pay | Admitting: Diagnostic Radiology

## 2019-04-30 HISTORY — PX: IR SINUS/FIST TUBE CHK-NON GI: IMG673

## 2019-04-30 LAB — COMPREHENSIVE METABOLIC PANEL
ALT: 17 U/L (ref 0–44)
AST: 18 U/L (ref 15–41)
Albumin: 2 g/dL — ABNORMAL LOW (ref 3.5–5.0)
Alkaline Phosphatase: 50 U/L (ref 38–126)
Anion gap: 9 (ref 5–15)
BUN: <5 mg/dL — ABNORMAL LOW (ref 6–20)
CO2: 23 mmol/L (ref 22–32)
Calcium: 7.8 mg/dL — ABNORMAL LOW (ref 8.9–10.3)
Chloride: 104 mmol/L (ref 98–111)
Creatinine, Ser: 0.83 mg/dL (ref 0.44–1.00)
GFR calc Af Amer: >60 mL/min (ref >60)
GFR calc non Af Amer: >60 mL/min (ref >60)
Glucose, Bld: 94 mg/dL (ref 70–99)
Potassium: 3.5 mmol/L (ref 3.5–5.1)
Sodium: 136 mmol/L (ref 135–145)
Total Bilirubin: 0.7 mg/dL (ref 0.3–1.2)
Total Protein: 5.7 g/dL — ABNORMAL LOW (ref 6.5–8.1)

## 2019-04-30 LAB — CBC
HCT: 28.5 % — ABNORMAL LOW (ref 36.0–46.0)
Hemoglobin: 9.4 g/dL — ABNORMAL LOW (ref 12.0–15.0)
MCH: 30.9 pg (ref 26.0–34.0)
MCHC: 33 g/dL (ref 30.0–36.0)
MCV: 93.8 fL (ref 80.0–100.0)
Platelets: 442 10*3/uL — ABNORMAL HIGH (ref 150–400)
RBC: 3.04 MIL/uL — ABNORMAL LOW (ref 3.87–5.11)
RDW: 14 % (ref 11.5–15.5)
WBC: 16.4 10*3/uL — ABNORMAL HIGH (ref 4.0–10.5)
nRBC: 0 % (ref 0.0–0.2)

## 2019-04-30 LAB — BODY FLUID CULTURE

## 2019-04-30 MED ORDER — MIDAZOLAM HCL 2 MG/2ML IJ SOLN
INTRAMUSCULAR | Status: AC
  Filled 2019-04-30: qty 4

## 2019-04-30 MED ORDER — FENTANYL CITRATE (PF) 100 MCG/2ML IJ SOLN
INTRAMUSCULAR | Status: AC | PRN
  Administered 2019-04-30 (×2): 50 ug via INTRAVENOUS

## 2019-04-30 MED ORDER — ZOLPIDEM TARTRATE 5 MG PO TABS
5.0000 mg | ORAL_TABLET | Freq: Every evening | ORAL | Status: DC | PRN
  Administered 2019-04-30 – 2019-05-03 (×4): 5 mg via ORAL
  Filled 2019-04-30 (×4): qty 1

## 2019-04-30 MED ORDER — FENTANYL CITRATE (PF) 100 MCG/2ML IJ SOLN
INTRAMUSCULAR | Status: AC
  Filled 2019-04-30: qty 2

## 2019-04-30 MED ORDER — SODIUM CHLORIDE 0.9 % IV SOLN
510.0000 mg | INTRAVENOUS | Status: DC
  Administered 2019-04-30: 510 mg via INTRAVENOUS
  Filled 2019-04-30: qty 17

## 2019-04-30 MED ORDER — IOHEXOL 300 MG/ML  SOLN
50.0000 mL | Freq: Once | INTRAMUSCULAR | Status: AC | PRN
  Administered 2019-04-30: 15 mL

## 2019-04-30 MED ORDER — FUROSEMIDE 10 MG/ML IJ SOLN
40.0000 mg | Freq: Every day | INTRAMUSCULAR | Status: AC
  Administered 2019-04-30 – 2019-05-01 (×2): 40 mg via INTRAVENOUS
  Filled 2019-04-30 (×2): qty 4

## 2019-04-30 MED ORDER — MIDAZOLAM HCL 2 MG/2ML IJ SOLN
INTRAMUSCULAR | Status: AC | PRN
  Administered 2019-04-30 (×4): 1 mg via INTRAVENOUS

## 2019-04-30 NOTE — Progress Notes (Signed)
POD#17/Admission Day #6 TAH/Bilateral Salpingectomy PO Pelvic abscess IV Zosyn/Drain placement/Blood transfusion  Subjective: Patient reports tolerating PO, + flatus, + BM and no problems voiding.    Objective: I have reviewed patient's vital signs.  vital signs, intake and output, medications and labs.  Vitals:   04/29/19 2058 04/30/19 0518  BP: (!) 144/59 (!) 124/53  Pulse: 65 (!) 57  Resp: 20 20  Temp: 99.2 F (37.3 C) 99.2 F (37.3 C)  SpO2: 100% 97%   I/O last 3 completed shifts: In: 3956.9 [P.O.:1560; I.V.:1971.6; Other:5; IV Piggyback:420.3] Out: 2400 [Urine:2350; Drains:50] Total I/O In: -  Out: 105 [Urine:100; Drains:5]  Results for orders placed or performed during the hospital encounter of 04/24/19 (from the past 24 hour(s))  CBC     Status: Abnormal   Collection Time: 04/30/19  3:33 AM  Result Value Ref Range   WBC 16.4 (H) 4.0 - 10.5 K/uL   RBC 3.04 (L) 3.87 - 5.11 MIL/uL   Hemoglobin 9.4 (L) 12.0 - 15.0 g/dL   HCT 28.5 (L) 36.0 - 46.0 %   MCV 93.8 80.0 - 100.0 fL   MCH 30.9 26.0 - 34.0 pg   MCHC 33.0 30.0 - 36.0 g/dL   RDW 14.0 11.5 - 15.5 %   Platelets 442 (H) 150 - 400 K/uL   nRBC 0.0 0.0 - 0.2 %  Comprehensive metabolic panel     Status: Abnormal   Collection Time: 04/30/19  3:33 AM  Result Value Ref Range   Sodium 136 135 - 145 mmol/L   Potassium 3.5 3.5 - 5.1 mmol/L   Chloride 104 98 - 111 mmol/L   CO2 23 22 - 32 mmol/L   Glucose, Bld 94 70 - 99 mg/dL   BUN <5 (L) 6 - 20 mg/dL   Creatinine, Ser 0.83 0.44 - 1.00 mg/dL   Calcium 7.8 (L) 8.9 - 10.3 mg/dL   Total Protein 5.7 (L) 6.5 - 8.1 g/dL   Albumin 2.0 (L) 3.5 - 5.0 g/dL   AST 18 15 - 41 U/L   ALT 17 0 - 44 U/L   Alkaline Phosphatase 50 38 - 126 U/L   Total Bilirubin 0.7 0.3 - 1.2 mg/dL   GFR calc non Af Amer >60 >60 mL/min   GFR calc Af Amer >60 >60 mL/min   Anion gap 9 5 - 15    EXAM General: alert and cooperative Resp: clear to auscultation bilaterally Cardio: regular rate and  rhythm GI: soft, non-tender; bowel sounds normal; no masses,  no organomegaly and incision: clean, dry and intact.  Still mildly distended. Extremities: no edema, redness or tenderness in the calves or thighs Vaginal Bleeding: minimal  Assessment: s/p : stable, tolerating diet and passing gas and stools.  Good Diuresis post Lasix yesterday.  Creat normal.  K+ Normal. Fluid Collection/Abscess Lt Anterior Pelvis/Abdomen on CT scan yesterday  Plan: Encourage ambulation NPO this morning for Drain Placement.  Continue Lasix/K-dur daily x 3 days. Will give Feraheme x 2 IV doses qweek. Ambien 10 mg HS PRN  LOS: 6 days    Princess Bruins, MD 04/30/2019 6:25 AM

## 2019-04-30 NOTE — Procedures (Signed)
Interventional Radiology Procedure:   Indications: Post hysterectomy and post operative abscesses  Procedure: 1) Injection of existing transgluteal drain 2) Placement of new anterior abdominal drain with CT guidance  Findings: 1)  Fistula connection between the pelvic drain and vagina. 2) Placed new 10 Fr drain in anterior abscess and removed 100 ml of yellow purulent fluid.  Complications: None     EBL: Less than 10 ml  Plan: Follow drain outputs.     Antoino Westhoff R. Anselm Pancoast, MD  Pager: [phone]

## 2019-05-01 LAB — CBC
HCT: 30.3 % — ABNORMAL LOW (ref 36.0–46.0)
Hemoglobin: 10 g/dL — ABNORMAL LOW (ref 12.0–15.0)
MCH: 30.8 pg (ref 26.0–34.0)
MCHC: 33 g/dL (ref 30.0–36.0)
MCV: 93.2 fL (ref 80.0–100.0)
Platelets: 507 10*3/uL — ABNORMAL HIGH (ref 150–400)
RBC: 3.25 MIL/uL — ABNORMAL LOW (ref 3.87–5.11)
RDW: 13.7 % (ref 11.5–15.5)
WBC: 14.4 10*3/uL — ABNORMAL HIGH (ref 4.0–10.5)
nRBC: 0 % (ref 0.0–0.2)

## 2019-05-01 LAB — AEROBIC/ANAEROBIC CULTURE W GRAM STAIN (SURGICAL/DEEP WOUND)
Culture: NORMAL
Special Requests: NORMAL

## 2019-05-01 MED ORDER — SODIUM CHLORIDE 0.9% FLUSH: 10.0000 mL | INTRAVENOUS | Status: DC | PRN

## 2019-05-01 MED ORDER — PIPERACILLIN-TAZOBACTAM 3.375 G IVPB
3.3750 g | Freq: Three times a day (TID) | INTRAVENOUS | Status: DC
  Administered 2019-05-01 – 2019-05-03 (×6): 3.375 g via INTRAVENOUS
  Filled 2019-05-01 (×6): qty 50

## 2019-05-01 NOTE — Progress Notes (Signed)
Pharmacy - piperacillin/tazobactam dosing   51 YOF with pelvic abscess followingi TAH/bilateral salpingectomy.  She is s/p IR drain placement.  Currently prescribed zosyn 3.375gm IV q6h over 50min infusion.   Plan - Change to [place] standard zosyn dosing of 3.375gm IV q8h with extended infusion (each dose over 4h infusion) - The above extended infusion dosing is preferred to q6h 64min infusion as extended infusion maximizes the pharmacodynamic properties of beta-lactams (time spent above MIC) compared to 59min infusion, thus increases likelihood of effective therapy.  Doreene Eland, PharmD, BCPS.   Work Cell: [phone] 05/01/2019 2:47 PM

## 2019-05-01 NOTE — Progress Notes (Signed)
Referring Physician(s): Miller,M  Supervising Physician: Aletta Edouard  Patient Status:  [facility] - In-pt  Chief Complaint:  Abdominal/pelvic pain/abscesses  Subjective: Pt doing ok ; still has some abd/pelvic soreness, more so at TG drain site; denies N/V; has many questions about why abscesses developed   Allergies: Tramadol and Vicodin [hydrocodone-acetaminophen]  Medications: Prior to Admission medications   Medication Sig Start Date End Date Taking? Authorizing Provider  ferrous sulfate 325 (65 FE) MG tablet Take 1 tablet (325 mg total) by mouth 2 (two) times daily with a meal. Patient taking differently: Take 325 mg by mouth 2 (two) times daily as needed (feels cold or sleepy).  09/11/17  Yes Janith Lima, MD  ibuprofen (ADVIL) 200 MG tablet Take 200 mg by mouth every 6 (six) hours as needed for moderate pain.   Yes [provider]  loratadine (CLARITIN) 10 MG tablet Take 10 mg by mouth daily as needed for allergies.   Yes [provider]  oxycodone (OXY-IR) 5 MG capsule Take 1 capsule (5 mg total) by mouth every 4 (four) hours as needed. Patient taking differently: Take 5 mg by mouth every 4 (four) hours as needed for pain.  04/14/19  Yes Princess Bruins, MD  rosuvastatin (CRESTOR) 5 MG tablet Take 1 tablet (5 mg total) by mouth daily. 08/20/18  Yes Janith Lima, MD  spironolactone (ALDACTONE) 25 MG tablet Take 1 tablet (25 mg total) by mouth daily. 03/20/18  Yes Janith Lima, MD  VITAMIN E PO Take 3 tablets by mouth daily.    Yes [provider]  zolpidem (AMBIEN) 10 MG tablet TAKE 1 TABLET BY MOUTH AT BEDTIME AS NEEDED FOR SLEEP Patient taking differently: Take 10 mg by mouth at bedtime.  01/07/19  Yes Janith Lima, MD     Vital Signs: BP (!) 110/55 (BP Location: Left Arm)    Pulse 73    Temp 98.5 F (36.9 C) (Oral)    Resp 16    Ht 5\' 6"  (1.676 m)    Wt 160 lb (72.6 kg)    LMP 11/03/2018    SpO2 99%    BMI 25.82 kg/m   Physical  Exam awake/alert; left TG , ant pelvic drains intact, outputs 10/190 cc respectively light brown to serosang fluid, tenderness more so at left TG drain  Imaging: Ct Abdomen Pelvis W Contrast  Result Date: 04/28/2019 CLINICAL DATA:  48 year old female with recent hysterectomy on 04/13/2019 and pelvic abscesses. Status post drain placement on 04/25/2019. EXAM: CT ABDOMEN AND PELVIS WITH CONTRAST TECHNIQUE: Multidetector CT imaging of the abdomen and pelvis was performed using the standard protocol following bolus administration of intravenous contrast. CONTRAST:  141mL OMNIPAQUE IOHEXOL 300 MG/ML  SOLN COMPARISON:  CT of the abdomen pelvis dated 04/24/2019. FINDINGS: Lower chest: Partially visualized small bilateral pleural effusions and associated partial compressive atelectasis of the lower lobes. The pleural effusions are new since the CT of 04/24/2019. No intra-abdominal free air. There is diffuse mesenteric edema and inflammation which appears worsened since the prior CT. There is now a small ascites, new since the prior CT. Hepatobiliary: Several scattered hepatic hypodense lesions are not characterized on this CT. There is mild periportal edema, new since the prior CT. There is sludge and small stones within the gallbladder. There is a small pericholecystic fluid, likely related to inflammatory changes of the mesentery and ascites. Acute cholecystitis is not entirely excluded. Clinical correlation is recommended. Pancreas: Unremarkable. No pancreatic ductal dilatation or surrounding  inflammatory changes. Spleen: Normal in size without focal abnormality. Adrenals/Urinary Tract: The adrenal glands are unremarkable. There is no hydronephrosis on either side. There is symmetric enhancement and excretion of contrast by both kidneys. The visualized ureters appear unremarkable. The fused thickened appearance of the bladder wall with perivesical stranding likely reactive to inflammatory changes of the pelvis.  Cystitis is not excluded. Stomach/Bowel: Diffusely thickened small bowel as well as thickened appearance of the colon with surrounding inflammation reactive to inflammatory changes/infection of the mesentery. There is no bowel obstruction. The appendix is unremarkable as visualized. Vascular/Lymphatic: There is moderate aortoiliac atherosclerotic disease. The IVC is unremarkable. No portal venous gas. Top-normal para-aortic and retroperitoneal lymph nodes. Reproductive: Hysterectomy. Other: There has been interval placement of a pigtail drainage catheter in the posterior pelvis and near complete resolution of the loculated fluid/abscess seen on the prior CT of 04/24/2019. There is however a larger collection in the left anterior hemipelvis abutting the left ovary and extending anteriorly through a defect in the anterior peritoneum into the anterior abdominal wall medial to the left rectus muscle. This collection measures approximately 6.1 x 7.3 cm on series 2, image 59. Several small pockets of air noted within this collection. An additional smaller collection measuring approximately 3.5 x 1.3 cm noted posterior and medial to the right ovary (series 2, image 63). There is enhancement of the peritoneal surface concerning for peritonitis. There is a 2.4 x 3.0 x 11.5 cm collection in the subcutaneous soft tissues of the anterior pelvic wall along the surgical incision also concerning for an infected fluid. There is diffuse subcutaneous edema as well as edema of the abdominal wall musculature, new since the prior CT. Musculoskeletal: No acute or significant osseous findings. IMPRESSION: 1. Interval placement of a pigtail drainage catheter in the posterior pelvis with near complete resolution of the previously seen loculated fluid/abscess in the posterior pelvis seen on the prior CT of 04/24/2019. 2. Additional loculated infected fluid/abscess noted within the pelvis. The larger collection in the left anterior pelvis  extends into the anterior pelvic wall through a defect medial to the left rectus muscle. A smaller collection is also noted posterior to the right ovary. 3. Overall interval increase in the inflammatory changes of the mesentery with development of small ascites and small bilateral pleural effusions and findings concerning for peritonitis. Clinical correlation is recommended. 4. Subcutaneous fluid along the surgical incision in the anterior pelvic wall most likely an infected fluid. 5. Aortic Atherosclerosis (ICD10-I70.0). Electronically Signed   By: Anner Crete M.D.   On: 04/28/2019 19:43   Ir Sinus/fist Tube Chk-non Gi  Result Date: 04/30/2019 INDICATION: 48 year old with hysterectomy and postoperative abscesses. Patient has additional intra-abdominal abscesses and plan to inject the existing transgluteal drain to evaluate for a fistula. EXAM: SINUS TRACT INJECTION / FISTULOGRAM COMPARISON:  None. MEDICATIONS: None ANESTHESIA/SEDATION: None COMPLICATIONS: None immediate. TECHNIQUE: Spot image of the drain was obtained prior to injection. Fluoroscopic images were obtained following drain injection. FLUOROSCOPY TIME:  54 seconds, 67 mGy PROCEDURE: Patient was placed on her right side. Fluoroscopic images were obtained during injection of the transgluteal drain. 15 mL of Omnipaque 300 was injected. Drain was re-attached to the evacuation bulb following the drain injection. Patient was taken to CT for additional images and placement of another abscess drain. FINDINGS: There is no significant collection around the pigtail drain. Fistula connection to the vagina which is anterior to the drain. Contrast does not fill loops of bowel. IMPRESSION: 1. Fistula connection to the  vagina. No evidence for a bowel fistula. 2. No significant abscess collection around the drain. Electronically Signed   By: Markus Daft M.D.   On: 04/30/2019 10:21   Ct Image Guided Drainage By Percutaneous Catheter  Result Date:  04/30/2019 INDICATION: 48 year old with a postoperative abscesses after hysterectomy. Patient already has a transgluteal drain but has additional intra-abdominal collections. EXAM: CT GUIDED DRAINAGE OF INTRA-ABDOMINAL ABSCESS MEDICATIONS: The patient is currently admitted to the hospital and receiving intravenous antibiotics. ANESTHESIA/SEDATION: 4.0 mg IV Versed 100 mcg IV Fentanyl Moderate Sedation Time:  20 minutes The patient was continuously monitored during the procedure by the interventional radiology nurse under my direct supervision. COMPLICATIONS: None immediate. TECHNIQUE: Informed written consent was obtained from the patient after a thorough discussion of the procedural risks, benefits and alternatives. All questions were addressed. A timeout was performed prior to the initiation of the procedure. PROCEDURE: Patient was placed supine and CT images through the lower abdomen and pelvis were obtained. The skin just caudal to the umbilicus was prepped with chlorhexidine and sterile field was created. Skin and soft tissues were anesthetized with 1% lidocaine. Using CT guidance, an 18 gauge trocar needle was directed into the anterior fluid collection and yellow fluid was aspirated. A stiff Amplatz wire was advanced into the collection and the tract was dilated to accommodate a 10.2 [place] multipurpose drain. Greater than 100 mL of yellow purulent fluid was removed from the collection. Catheter was sutured to skin and attached to a suction bulb. Follow up CT images were obtained. FINDINGS: Large fluid collection in the anterior lower abdomen that extends into the pelvis. 10.2 French drain was placed within the collection and greater than 100 mL of purulent fluid was removed. Initial CT images also demonstrate the left transgluteal drain and there is contrast in the vagina from the recent drain injection. Findings compatible with a vaginal fistula. There is no evidence for contrast within bowel loops.  IMPRESSION: 1. CT-guided placement of a drain within the abscess in the anterior lower abdomen and pelvis. Greater than 100 mL of purulent fluid was removed. Fluid was sent for culture. 2. Contrast in the vagina related to recent transgluteal drain injection. Findings compatible with a vaginal fistula. There is no evidence for a bowel fistula. Electronically Signed   By: Markus Daft M.D.   On: 04/30/2019 11:09    Labs:  CBC: Recent Labs    04/28/19 0323 04/29/19 0328 04/30/19 0333 05/01/19 0803  WBC 14.3* 14.8* 16.4* 14.4*  HGB 8.9* 9.3* 9.4* 10.0*  HCT 27.6* 28.6* 28.5* 30.3*  PLT 437* 437* 442* 507*    COAGS: Recent Labs    04/25/19 0110  INR 1.2  APTT 37*    BMP: Recent Labs    04/27/19 0325 04/28/19 0323 04/29/19 0328 04/30/19 0333  NA 138 136 135 136  K 3.2* 3.3* 3.4* 3.5  CL 107 106 106 104  CO2 23 22 22 23   GLUCOSE 109* 104* 120* 94  BUN 10 5* <5* <5*  CALCIUM 8.2* 7.9* 7.9* 7.8*  CREATININE 0.85 0.76 0.75 0.83  GFRNONAA >60 >60 >60 >60  GFRAA >60 >60 >60 >60    LIVER FUNCTION TESTS: Recent Labs    08/19/18 1626 04/24/19 1753 04/30/19 0333  BILITOT 0.3 1.9* 0.7  AST 12 39 18  ALT 5 23 17   ALKPHOS 49 85 50  PROT 7.7 8.3* 5.7*  ALBUMIN 4.1 2.9* 2.0*    Assessment and Plan: Pt s/p TAH/BSO  04/13/19 secondary to  large symptomatic uterine fibroids; now with postop gas/fluid collection in pelvic cul-de-sac, status post drain placement on 04/25/2019;  s/p placement of additional drain in ant lower abd/pelvis fluid collection 10/29; injection of left TG drain yesterday revealed fistula to vagina; afebrile; WBC 14.4(16.4), hgb 10, latest fluid cx pend; cont current tx/drain flushes; repeat CT/drain injections either next week as IP or at IR clinic if d/c'd home soon and outputs minimal  Electronically Signed: D. Rowe Robert, PA-C 05/01/2019, 5:03 PM   I spent a total of 15 minutes at the the patient's bedside AND on the patient's hospital floor or  unit, greater than 50% of which was counseling/coordinating care for pelvic abscess drains    Patient ID: Samantha Reed, female   DOB: 02-28-1971, 48 y.o.   MRN: [id number]

## 2019-05-02 LAB — CBC
HCT: 27.4 % — ABNORMAL LOW (ref 36.0–46.0)
Hemoglobin: 8.7 g/dL — ABNORMAL LOW (ref 12.0–15.0)
MCH: 29.6 pg (ref 26.0–34.0)
MCHC: 31.8 g/dL (ref 30.0–36.0)
MCV: 93.2 fL (ref 80.0–100.0)
Platelets: 437 10*3/uL — ABNORMAL HIGH (ref 150–400)
RBC: 2.94 MIL/uL — ABNORMAL LOW (ref 3.87–5.11)
RDW: 13.8 % (ref 11.5–15.5)
WBC: 12.2 10*3/uL — ABNORMAL HIGH (ref 4.0–10.5)
nRBC: 0 % (ref 0.0–0.2)

## 2019-05-02 LAB — COMPREHENSIVE METABOLIC PANEL
ALT: 14 U/L (ref 0–44)
AST: 12 U/L — ABNORMAL LOW (ref 15–41)
Albumin: 2.3 g/dL — ABNORMAL LOW (ref 3.5–5.0)
Alkaline Phosphatase: 48 U/L (ref 38–126)
Anion gap: 7 (ref 5–15)
BUN: 5 mg/dL — ABNORMAL LOW (ref 6–20)
CO2: 25 mmol/L (ref 22–32)
Calcium: 8.4 mg/dL — ABNORMAL LOW (ref 8.9–10.3)
Chloride: 105 mmol/L (ref 98–111)
Creatinine, Ser: 0.81 mg/dL (ref 0.44–1.00)
GFR calc Af Amer: >60 mL/min (ref >60)
GFR calc non Af Amer: >60 mL/min (ref >60)
Glucose, Bld: 91 mg/dL (ref 70–99)
Potassium: 3.4 mmol/L — ABNORMAL LOW (ref 3.5–5.1)
Sodium: 137 mmol/L (ref 135–145)
Total Bilirubin: 0.4 mg/dL (ref 0.3–1.2)
Total Protein: 6.2 g/dL — ABNORMAL LOW (ref 6.5–8.1)

## 2019-05-02 MED ORDER — POTASSIUM CHLORIDE CRYS ER 20 MEQ PO TBCR
20.0000 meq | EXTENDED_RELEASE_TABLET | Freq: Every day | ORAL | Status: AC
  Administered 2019-05-02 – 2019-05-04 (×3): 20 meq via ORAL
  Filled 2019-05-02 (×3): qty 1

## 2019-05-02 NOTE — Progress Notes (Signed)
POD#19/Admission Day #8 TAH/Bilateral Salpingectomy PO Pelvic abscess IV Zosyn/Drain placement/Blood transfusion  Subjective: Patient reports tolerating PO, + flatus, + BM and no problems voiding.    Objective: I have reviewed patient's vital signs.  vital signs, intake and output, medications and labs.  Vitals:   05/01/19 2104 05/02/19 0510  BP: 131/72 133/76  Pulse: 71 75  Resp: 20 18  Temp: 98.8 F (37.1 C) 98.3 F (36.8 C)  SpO2: 99% 97%   I/O last 3 completed shifts: In: 1379.1 [P.O.:600; I.V.:352.3; Other:30; IV Piggyback:396.8] Out: [id number] [Urine:6900; Drains:40] No intake/output data recorded.  Results for orders placed or performed during the hospital encounter of 04/24/19 (from the past 24 hour(s))  CBC     Status: Abnormal   Collection Time: 05/01/19  8:03 AM  Result Value Ref Range   WBC 14.4 (H) 4.0 - 10.5 K/uL   RBC 3.25 (L) 3.87 - 5.11 MIL/uL   Hemoglobin 10.0 (L) 12.0 - 15.0 g/dL   HCT 30.3 (L) 36.0 - 46.0 %   MCV 93.2 80.0 - 100.0 fL   MCH 30.8 26.0 - 34.0 pg   MCHC 33.0 30.0 - 36.0 g/dL   RDW 13.7 11.5 - 15.5 %   Platelets 507 (H) 150 - 400 K/uL   nRBC 0.0 0.0 - 0.2 %  CBC     Status: Abnormal   Collection Time: 05/02/19  3:58 AM  Result Value Ref Range   WBC 12.2 (H) 4.0 - 10.5 K/uL   RBC 2.94 (L) 3.87 - 5.11 MIL/uL   Hemoglobin 8.7 (L) 12.0 - 15.0 g/dL   HCT 27.4 (L) 36.0 - 46.0 %   MCV 93.2 80.0 - 100.0 fL   MCH 29.6 26.0 - 34.0 pg   MCHC 31.8 30.0 - 36.0 g/dL   RDW 13.8 11.5 - 15.5 %   Platelets 437 (H) 150 - 400 K/uL   nRBC 0.0 0.0 - 0.2 %  Comprehensive metabolic panel     Status: Abnormal   Collection Time: 05/02/19  3:58 AM  Result Value Ref Range   Sodium 137 135 - 145 mmol/L   Potassium 3.4 (L) 3.5 - 5.1 mmol/L   Chloride 105 98 - 111 mmol/L   CO2 25 22 - 32 mmol/L   Glucose, Bld 91 70 - 99 mg/dL   BUN 5 (L) 6 - 20 mg/dL   Creatinine, Ser 0.81 0.44 - 1.00 mg/dL   Calcium 8.4 (L) 8.9 - 10.3 mg/dL   Total Protein 6.2 (L) 6.5 -  8.1 g/dL   Albumin 2.3 (L) 3.5 - 5.0 g/dL   AST 12 (L) 15 - 41 U/L   ALT 14 0 - 44 U/L   Alkaline Phosphatase 48 38 - 126 U/L   Total Bilirubin 0.4 0.3 - 1.2 mg/dL   GFR calc non Af Amer >60 >60 mL/min   GFR calc Af Amer >60 >60 mL/min   Anion gap 7 5 - 15    EXAM General: alert, cooperative and appears stated age Resp: clear to auscultation bilaterally Cardio: regular rate and rhythm GI: soft, non-tender; bowel sounds normal; no masses,  no organomegaly and incision: clean, dry and intact Extremities: no edema, redness or tenderness in the calves or thighs Vaginal Bleeding: none  Assessment: s/p : stable, progressing well and tolerating diet  Afebrile with improving WBC count.   Drain drained 40 cc in last 24 hours Excellent response to Lasix x 3 days.  Good I/O overall balance now.  Renal function normal.  K+ 3.4  Plan: Encourage ambulation  Continue Zosyn IV. Repeat CT scan tomorrow 05/03/2019. Stop Lasix.  Continue with K-dur today. CBC/CMP daily  LOS: 8 days    Princess Bruins, MD 05/02/2019 7:33 AM

## 2019-05-02 NOTE — Progress Notes (Signed)
POD#18/Admission Day #7 TAH/Bilateral Salpingectomy PO Pelvic abscess IV Zosyn/Drain placement/Blood transfusion  Subjective: Patient reports tolerating PO, + flatus, + BM and no problems voiding.    Objective: I have reviewed patient's vital signs.  vital signs, intake and output, medications and labs.  VS:  BP 131/72 Pulse 71 RR 20 Temp 98.8 F SpO2 99%   EXAM General: alert, cooperative and appears stated age Resp: clear to auscultation bilaterally Cardio: regular rate and rhythm GI: soft, non-tender; bowel sounds normal; no masses,  no organomegaly and incision: clean, dry and intact Extremities: no edema, redness or tenderness in the calves or thighs Vaginal Bleeding: none  Assessment: s/p : stable and progressing well  Plan: Encourage ambulation  Lasix/K-Dur today Continue Zosyn LOS 7 days   Princess Bruins, MD 05/02/2019 7:43 AM

## 2019-05-03 ENCOUNTER — Inpatient Hospital Stay (HOSPITAL_COMMUNITY): Payer: 59

## 2019-05-03 LAB — COMPREHENSIVE METABOLIC PANEL
ALT: 16 U/L (ref 0–44)
AST: 17 U/L (ref 15–41)
Albumin: 2.3 g/dL — ABNORMAL LOW (ref 3.5–5.0)
Alkaline Phosphatase: 45 U/L (ref 38–126)
Anion gap: 8 (ref 5–15)
BUN: 6 mg/dL (ref 6–20)
CO2: 24 mmol/L (ref 22–32)
Calcium: 8.6 mg/dL — ABNORMAL LOW (ref 8.9–10.3)
Chloride: 105 mmol/L (ref 98–111)
Creatinine, Ser: 0.9 mg/dL (ref 0.44–1.00)
GFR calc Af Amer: >60 mL/min (ref >60)
GFR calc non Af Amer: >60 mL/min (ref >60)
Glucose, Bld: 90 mg/dL (ref 70–99)
Potassium: 3.5 mmol/L (ref 3.5–5.1)
Sodium: 137 mmol/L (ref 135–145)
Total Bilirubin: 0.4 mg/dL (ref 0.3–1.2)
Total Protein: 6.4 g/dL — ABNORMAL LOW (ref 6.5–8.1)

## 2019-05-03 LAB — CBC
HCT: 27.7 % — ABNORMAL LOW (ref 36.0–46.0)
Hemoglobin: 9 g/dL — ABNORMAL LOW (ref 12.0–15.0)
MCH: 30.8 pg (ref 26.0–34.0)
MCHC: 32.5 g/dL (ref 30.0–36.0)
MCV: 94.9 fL (ref 80.0–100.0)
Platelets: 436 10*3/uL — ABNORMAL HIGH (ref 150–400)
RBC: 2.92 MIL/uL — ABNORMAL LOW (ref 3.87–5.11)
RDW: 13.9 % (ref 11.5–15.5)
WBC: 12.3 10*3/uL — ABNORMAL HIGH (ref 4.0–10.5)
nRBC: 0.2 % (ref 0.0–0.2)

## 2019-05-03 MED ORDER — SODIUM CHLORIDE 0.9 % IV SOLN
510.0000 mg | INTRAVENOUS | Status: AC
  Administered 2019-05-04: 510 mg via INTRAVENOUS
  Filled 2019-05-03: qty 17

## 2019-05-03 MED ORDER — IOHEXOL 300 MG/ML  SOLN
100.0000 mL | Freq: Once | INTRAMUSCULAR | Status: AC | PRN
  Administered 2019-05-03: 100 mL via INTRAVENOUS

## 2019-05-03 MED ORDER — SODIUM CHLORIDE (PF) 0.9 % IJ SOLN
INTRAMUSCULAR | Status: AC
  Administered 2019-05-03: 12:00:00
  Filled 2019-05-03: qty 50

## 2019-05-03 MED ORDER — AMOXICILLIN-POT CLAVULANATE 875-125 MG PO TABS
1.0000 | ORAL_TABLET | Freq: Two times a day (BID) | ORAL | Status: DC
  Administered 2019-05-03 – 2019-05-04 (×2): 1 via ORAL
  Filled 2019-05-03 (×2): qty 1

## 2019-05-03 NOTE — Progress Notes (Signed)
POD#20/Admission Day #9 TAH/Bilateral Salpingectomy PO Pelvic abscess IV Zosyn/Drain placement x 2/Blood transfusion  Subjective: Patient reports tolerating PO, + flatus, + BM and no problems voiding.  Pain controled on less PO narcotic.  Objective: I have reviewed patient's vital signs.  vital signs, intake and output, medications, labs and radiology results.  Vitals:   05/03/19 0503 05/03/19 1402  BP: 126/61 128/66  Pulse: (!) 52 65  Resp: 14   Temp: 98.2 F (36.8 C) 98.5 F (36.9 C)  SpO2: 99% 100%   I/O last 3 completed shifts: In: 1450.3 [P.O.:1080; I.V.:174; Other:40; IV Piggyback:156.3] Out: [id number]; Drains:45] Total I/O In: 240 [P.O.:240] Out: -   Results for orders placed or performed during the hospital encounter of 04/24/19 (from the past 24 hour(s))  CBC     Status: Abnormal   Collection Time: 05/03/19  3:50 AM  Result Value Ref Range   WBC 12.3 (H) 4.0 - 10.5 K/uL   RBC 2.92 (L) 3.87 - 5.11 MIL/uL   Hemoglobin 9.0 (L) 12.0 - 15.0 g/dL   HCT 27.7 (L) 36.0 - 46.0 %   MCV 94.9 80.0 - 100.0 fL   MCH 30.8 26.0 - 34.0 pg   MCHC 32.5 30.0 - 36.0 g/dL   RDW 13.9 11.5 - 15.5 %   Platelets 436 (H) 150 - 400 K/uL   nRBC 0.2 0.0 - 0.2 %  Comprehensive metabolic panel     Status: Abnormal   Collection Time: 05/03/19  3:50 AM  Result Value Ref Range   Sodium 137 135 - 145 mmol/L   Potassium 3.5 3.5 - 5.1 mmol/L   Chloride 105 98 - 111 mmol/L   CO2 24 22 - 32 mmol/L   Glucose, Bld 90 70 - 99 mg/dL   BUN 6 6 - 20 mg/dL   Creatinine, Ser 0.90 0.44 - 1.00 mg/dL   Calcium 8.6 (L) 8.9 - 10.3 mg/dL   Total Protein 6.4 (L) 6.5 - 8.1 g/dL   Albumin 2.3 (L) 3.5 - 5.0 g/dL   AST 17 15 - 41 U/L   ALT 16 0 - 44 U/L   Alkaline Phosphatase 45 38 - 126 U/L   Total Bilirubin 0.4 0.3 - 1.2 mg/dL   GFR calc non Af Amer >60 >60 mL/min   GFR calc Af Amer >60 >60 mL/min   Anion gap 8 5 - 15    EXAM General: alert, cooperative and appears stated age Resp: clear to  auscultation bilaterally Cardio: regular rate and rhythm GI: soft, non-tender; bowel sounds normal; no masses,  no organomegaly and incision: clean, dry and intact Extremities: no edema, redness or tenderness in the calves or thighs Vaginal Bleeding: none  CT Abdo/Pelvis today: Reproductive: Postop changes from hysterectomy again noted. A lefttransgluteal percutaneous drainage catheter is again seen in the pelvic cul-de-sac, with no residual fluid collections seen at this site. A new percutaneous drainage catheter is seen in the left lower quadrant. Rim enhancing fluid collection at this site has significantly decreased in size, currently measuring 3.4 x 3.1 cm on image 62/2, compared to 6.7 x 5.6 cm previously. A small fluid collection in the midline anterior abdominal wall soft tissues has decreased in size, currently measuring 3.3 x 1.5 cm on image 54/2, compared to 5.6 x 2.7 cm previously. A small rim enhancing fluid collection in the right adnexa measures 3.3 by 1.1 cm, and has not significantly changed. No new or enlarging abscess identified. Other:  None. Musculoskeletal:  No suspicious bone  lesions identified.  IMPRESSION: Significant decrease in size of left lower quadrant abscess following percutaneous drainage catheter placement. No significant residual fluid collection at site of left transgluteal drainage catheter in the pelvic cul-de-sac. Decreased size of small abscess in midline anterior abdominal wall soft tissues. Stable small abscess in right adnexal region. No new or enlarging abscess identified within the pelvis or abdomen. Cholelithiasis. No radiographic evidence of cholecystitis. Aortic Atherosclerosis (ICD10-I70.0).   Assessment: s/p : stable, progressing well and tolerating diet  Afebrile, WBC stable at 12.3.  Hb stable at 9.0.  Gas and BMs present. Fluid collections/abscesses decreased with the 2 drains/no new or enlarging collection.   Plan: Will stop IV  Zosyn and start Augmentin PO in preparation for discharge home.  Feraheme IV 2nd dose tomorrow 05/04/2019.  Will contact [facility] for drain management as an Outpatient.  LOS: 9 days    Princess Bruins, MD 05/03/2019 2:10 PM

## 2019-05-04 ENCOUNTER — Ambulatory Visit: Payer: 59 | Admitting: Obstetrics & Gynecology

## 2019-05-04 ENCOUNTER — Other Ambulatory Visit: Payer: Self-pay | Admitting: *Deleted

## 2019-05-04 DIAGNOSIS — R7989 Other specified abnormal findings of blood chemistry: Secondary | ICD-10-CM

## 2019-05-04 LAB — CBC
HCT: 28.6 % — ABNORMAL LOW (ref 36.0–46.0)
Hemoglobin: 9.1 g/dL — ABNORMAL LOW (ref 12.0–15.0)
MCH: 30 pg (ref 26.0–34.0)
MCHC: 31.8 g/dL (ref 30.0–36.0)
MCV: 94.4 fL (ref 80.0–100.0)
Platelets: 476 10*3/uL — ABNORMAL HIGH (ref 150–400)
RBC: 3.03 MIL/uL — ABNORMAL LOW (ref 3.87–5.11)
RDW: 13.9 % (ref 11.5–15.5)
WBC: 12.1 10*3/uL — ABNORMAL HIGH (ref 4.0–10.5)
nRBC: 0 % (ref 0.0–0.2)

## 2019-05-04 LAB — COMPREHENSIVE METABOLIC PANEL
ALT: 18 U/L (ref 0–44)
AST: 23 U/L (ref 15–41)
Albumin: 2.5 g/dL — ABNORMAL LOW (ref 3.5–5.0)
Alkaline Phosphatase: 44 U/L (ref 38–126)
Anion gap: 10 (ref 5–15)
BUN: <5 mg/dL — ABNORMAL LOW (ref 6–20)
CO2: 23 mmol/L (ref 22–32)
Calcium: 8.7 mg/dL — ABNORMAL LOW (ref 8.9–10.3)
Chloride: 104 mmol/L (ref 98–111)
Creatinine, Ser: 0.75 mg/dL (ref 0.44–1.00)
GFR calc Af Amer: >60 mL/min (ref >60)
GFR calc non Af Amer: >60 mL/min (ref >60)
Glucose, Bld: 86 mg/dL (ref 70–99)
Potassium: 3.3 mmol/L — ABNORMAL LOW (ref 3.5–5.1)
Sodium: 137 mmol/L (ref 135–145)
Total Bilirubin: 0.6 mg/dL (ref 0.3–1.2)
Total Protein: 6.4 g/dL — ABNORMAL LOW (ref 6.5–8.1)

## 2019-05-04 MED ORDER — FLUCONAZOLE 150 MG PO TABS: 150.0000 mg | ORAL_TABLET | Freq: Every day | ORAL | 3 refills | Status: AC

## 2019-05-04 MED ORDER — POTASSIUM CHLORIDE CRYS ER 20 MEQ PO TBCR: 20.0000 meq | EXTENDED_RELEASE_TABLET | Freq: Every day | ORAL | 0 refills | Status: DC

## 2019-05-04 MED ORDER — OXYCODONE HCL 5 MG PO TABS: 5.0000 mg | ORAL_TABLET | Freq: Four times a day (QID) | ORAL | 0 refills | Status: DC | PRN

## 2019-05-04 MED ORDER — AMOXICILLIN-POT CLAVULANATE 875-125 MG PO TABS: 1.0000 | ORAL_TABLET | Freq: Two times a day (BID) | ORAL | 0 refills | Status: AC

## 2019-05-04 MED ORDER — BACITRACIN-NEOMYCIN-POLYMYXIN OINTMENT TUBE
TOPICAL_OINTMENT | Freq: Every day | CUTANEOUS | Status: DC
  Administered 2019-05-04: 13:00:00 via TOPICAL
  Filled 2019-05-04: qty 14.17

## 2019-05-04 MED ORDER — AMOXICILLIN-POT CLAVULANATE 875-125 MG PO TABS: 1.0000 | ORAL_TABLET | Freq: Two times a day (BID) | ORAL | 0 refills | Status: DC

## 2019-05-04 NOTE — Progress Notes (Signed)
POD#21/Admission Day #10 TAH/Bilateral Salpingectomy PO Pelvic abscess IV Zosyn/Drain placement x 2/Blood transfusion  Subjective: Patient reports tolerating PO, + flatus, + BM and no problems voiding.    Objective: I have reviewed patient's vital signs.  vital signs, intake and output, medications and labs.  Vitals:   05/03/19 2056 05/04/19 0537  BP: (!) 125/58 (!) 118/52  Pulse: (!) 58 61  Resp: 18 16  Temp: 98.3 F (36.8 C) 98.2 F (36.8 C)  SpO2: 100% 99%   I/O last 3 completed shifts: In: 1410.5 [P.O.:1160; I.V.:180.9; Other:20; IV Piggyback:49.6] Out: [id number] [Urine:3200; Drains:35] Total I/O In: -  Out: 1000 [Urine:1000]  Results for orders placed or performed during the hospital encounter of 04/24/19 (from the past 24 hour(s))  CBC     Status: Abnormal   Collection Time: 05/04/19  5:00 AM  Result Value Ref Range   WBC 12.1 (H) 4.0 - 10.5 K/uL   RBC 3.03 (L) 3.87 - 5.11 MIL/uL   Hemoglobin 9.1 (L) 12.0 - 15.0 g/dL   HCT 28.6 (L) 36.0 - 46.0 %   MCV 94.4 80.0 - 100.0 fL   MCH 30.0 26.0 - 34.0 pg   MCHC 31.8 30.0 - 36.0 g/dL   RDW 13.9 11.5 - 15.5 %   Platelets 476 (H) 150 - 400 K/uL   nRBC 0.0 0.0 - 0.2 %  Comprehensive metabolic panel     Status: Abnormal   Collection Time: 05/04/19  5:00 AM  Result Value Ref Range   Sodium 137 135 - 145 mmol/L   Potassium 3.3 (L) 3.5 - 5.1 mmol/L   Chloride 104 98 - 111 mmol/L   CO2 23 22 - 32 mmol/L   Glucose, Bld 86 70 - 99 mg/dL   BUN <5 (L) 6 - 20 mg/dL   Creatinine, Ser 0.75 0.44 - 1.00 mg/dL   Calcium 8.7 (L) 8.9 - 10.3 mg/dL   Total Protein 6.4 (L) 6.5 - 8.1 g/dL   Albumin 2.5 (L) 3.5 - 5.0 g/dL   AST 23 15 - 41 U/L   ALT 18 0 - 44 U/L   Alkaline Phosphatase 44 38 - 126 U/L   Total Bilirubin 0.6 0.3 - 1.2 mg/dL   GFR calc non Af Amer >60 >60 mL/min   GFR calc Af Amer >60 >60 mL/min   Anion gap 10 5 - 15    EXAM General: alert, cooperative and appears stated age Resp: clear to auscultation  bilaterally Cardio: regular rate and rhythm GI: soft, non-tender; bowel sounds normal; no masses,  no organomegaly and incision: clean, dry and intact Extremities: no edema, redness or tenderness in the calves or thighs Vaginal Bleeding: none  Assessment: s/p : stable, progressing well and tolerating diet  Remains afebrile/stable WBC count on Augmentin PO x yesterday.  Gas/BMs present.  Hb stable.  CT scan showing improved fluid collections.  Very little drainage in drains.  Plan: Discharge home Feraheme 2nd dose today before d/c.  Will continue on Augmenting x 14 days.  Oxicodone PRN.  F/U in my office on Thursday or Friday.  Will contact intervention Radiologist for drain management.  Precaustions at home discussed with patient.  Will call my office for any problem including Sx of recurring infection.  LOS: 10 days    Princess Bruins, MD 05/04/2019 8:10 AM

## 2019-05-04 NOTE — Discharge Summary (Signed)
Physician Discharge Summary  Patient ID: Samantha Reed MRN: [id number] DOB/AGE: March 12, 1971 48 y.o.  Admit date: 04/24/2019 Discharge date: 05/04/2019  Admission Diagnoses: Intra-abdominal abscess [facility]) [K65.1] Pelvic abscess in female [N73.9]   Discharge Diagnoses:  Active Problems:   Postoperative state   Pelvic abscess in female   Discharged Condition: Very good  Consults:[facility] (Hospitalist)  Significant Diagnostic Studies: CT scans  Treatments: IV Zosyn, drains, Blood transfusion, Feraheme x 2.  Vitals:   05/03/19 2056 05/04/19 0537  BP: (!) 125/58 (!) 118/52  Pulse: (!) 58 61  Resp: 18 16  Temp: 98.3 F (36.8 C) 98.2 F (36.8 C)  SpO2: 100% 99%     Total I/O In: -  Out: [place] [facility] Course: Very good response to IV Zosyn and drainage of abscesses with drains x 2 placed by [facility].  Discharge Exam: Normal  Disposition: D/C home  Medications sent to [facility]:  Fluconazole Augmentin Oxycodone     Signed: Princess Bruins 05/04/2019, 8:17 AM

## 2019-05-04 NOTE — Progress Notes (Signed)
Discharge instructions given to pt and all questions were answered.  

## 2019-05-04 NOTE — Progress Notes (Signed)
Referring Physician(s): Miller,M  Supervising Physician: Sandi Mariscal  Patient Status:  [facility] - In-pt  Chief Complaint: Abdominal/pelvic pain/abscesses   Subjective: Pt feeling better this am; awaiting d/c home with drains   Allergies: Tramadol and Vicodin [hydrocodone-acetaminophen]  Medications: Prior to Admission medications   Medication Sig Start Date End Date Taking? Authorizing Provider  ferrous sulfate 325 (65 FE) MG tablet Take 1 tablet (325 mg total) by mouth 2 (two) times daily with a meal. Patient taking differently: Take 325 mg by mouth 2 (two) times daily as needed (feels cold or sleepy).  09/11/17  Yes Janith Lima, MD  ibuprofen (ADVIL) 200 MG tablet Take 200 mg by mouth every 6 (six) hours as needed for moderate pain.   Yes [provider]  loratadine (CLARITIN) 10 MG tablet Take 10 mg by mouth daily as needed for allergies.   Yes [provider]  oxycodone (OXY-IR) 5 MG capsule Take 1 capsule (5 mg total) by mouth every 4 (four) hours as needed. Patient taking differently: Take 5 mg by mouth every 4 (four) hours as needed for pain.  04/14/19  Yes Princess Bruins, MD  rosuvastatin (CRESTOR) 5 MG tablet Take 1 tablet (5 mg total) by mouth daily. 08/20/18  Yes Janith Lima, MD  spironolactone (ALDACTONE) 25 MG tablet Take 1 tablet (25 mg total) by mouth daily. 03/20/18  Yes Janith Lima, MD  VITAMIN E PO Take 3 tablets by mouth daily.    Yes [provider]  zolpidem (AMBIEN) 10 MG tablet TAKE 1 TABLET BY MOUTH AT BEDTIME AS NEEDED FOR SLEEP Patient taking differently: Take 10 mg by mouth at bedtime.  01/07/19  Yes Janith Lima, MD  amoxicillin-clavulanate (AUGMENTIN) 875-125 MG tablet Take 1 tablet by mouth 2 (two) times daily for 14 days. 05/04/19 05/18/19  Princess Bruins, MD  fluconazole (DIFLUCAN) 150 MG tablet Take 1 tablet (150 mg total) by mouth daily for 3 days. 05/04/19 05/07/19  Princess Bruins, MD  oxyCODONE (OXY  IR/ROXICODONE) 5 MG immediate release tablet Take 1 tablet (5 mg total) by mouth every 6 (six) hours as needed for severe pain. 05/04/19   Princess Bruins, MD     Vital Signs: BP 119/65 (BP Location: Left Arm)    Pulse (!) 56    Temp 98.3 F (36.8 C) (Oral)    Resp 18    Ht 5\' 6"  (1.676 m)    Wt 160 lb (72.6 kg)    LMP 11/03/2018    SpO2 96%    BMI 25.82 kg/m   Physical Exam awake/alert; left TG/ant pelvic drains intact, outputs minimal amt serous fluid; JP bulb switched out for gravity bag on left TG drain  Imaging: Ct Abdomen Pelvis W Contrast  Result Date: 05/03/2019 CLINICAL DATA:  Follow-up abdominal and pelvic abscesses following percutaneous catheter drainage. Postop from hysterectomy. EXAM: CT ABDOMEN AND PELVIS WITH CONTRAST TECHNIQUE: Multidetector CT imaging of the abdomen and pelvis was performed using the standard protocol following bolus administration of intravenous contrast. CONTRAST:  156mL OMNIPAQUE IOHEXOL 300 MG/ML  SOLN COMPARISON:  04/28/2019 FINDINGS: Lower Chest: No acute findings. Hepatobiliary: No hepatic masses identified. Several small cysts in right and left lobes remains stable. Gallstones are seen, however there is no evidence of cholecystitis or biliary dilatation. Pancreas:  No mass or inflammatory changes. Spleen: Within normal limits in size and appearance. Adrenals/Urinary Tract: No masses identified. No evidence of hydronephrosis. Stomach/Bowel: No evidence of obstruction, inflammatory process or abnormal fluid  collections. Vascular/Lymphatic: No pathologically enlarged lymph nodes. No abdominal aortic aneurysm. Aortic atherosclerosis. Reproductive: Postop changes from hysterectomy again noted. A left transgluteal percutaneous drainage catheter is again seen in the pelvic cul-de-sac, with no residual fluid collections seen at this site. A new percutaneous drainage catheter is seen in the left lower quadrant. Rim enhancing fluid collection at this site has  significantly decreased in size, currently measuring 3.4 x 3.1 cm on image 62/2, compared to 6.7 x 5.6 cm previously. A small fluid collection in the midline anterior abdominal wall soft tissues has decreased in size, currently measuring 3.3 x 1.5 cm on image 54/2, compared to 5.6 x 2.7 cm previously. A small rim enhancing fluid collection in the right adnexa measures 3.3 by 1.1 cm, and has not significantly changed. No new or enlarging abscess identified. Other:  None. Musculoskeletal:  No suspicious bone lesions identified. IMPRESSION: Significant decrease in size of left lower quadrant abscess following percutaneous drainage catheter placement. No significant residual fluid collection at site of left transgluteal drainage catheter in the pelvic cul-de-sac. Decreased size of small abscess in midline anterior abdominal wall soft tissues. Stable small abscess in right adnexal region. No new or enlarging abscess identified within the pelvis or abdomen. Cholelithiasis. No radiographic evidence of cholecystitis. Aortic Atherosclerosis (ICD10-I70.0). Electronically Signed   By: Marlaine Hind M.D.   On: 05/03/2019 15:23    Labs:  CBC: Recent Labs    05/01/19 0803 05/02/19 0358 05/03/19 0350 05/04/19 0500  WBC 14.4* 12.2* 12.3* 12.1*  HGB 10.0* 8.7* 9.0* 9.1*  HCT 30.3* 27.4* 27.7* 28.6*  PLT 507* 437* 436* 476*    COAGS: Recent Labs    04/25/19 0110  INR 1.2  APTT 37*    BMP: Recent Labs    04/30/19 0333 05/02/19 0358 05/03/19 0350 05/04/19 0500  NA 136 137 137 137  K 3.5 3.4* 3.5 3.3*  CL 104 105 105 104  CO2 23 25 24 23   GLUCOSE 94 91 90 86  BUN <5* 5* 6 <5*  CALCIUM 7.8* 8.4* 8.6* 8.7*  CREATININE 0.83 0.81 0.90 0.75  GFRNONAA >60 >60 >60 >60  GFRAA >60 >60 >60 >60    LIVER FUNCTION TESTS: Recent Labs    04/30/19 0333 05/02/19 0358 05/03/19 0350 05/04/19 0500  BILITOT 0.7 0.4 0.4 0.6  AST 18 12* 17 23  ALT 17 14 16 18   ALKPHOS 50 48 45 44  PROT 5.7* 6.2* 6.4* 6.4*   ALBUMIN 2.0* 2.3* 2.3* 2.5*    Assessment and Plan: Pt s/p TAH/BSO 04/13/19 secondary to large symptomatic uterine fibroids;now with postopgas/fluid collection in pelvic cul-de-sac, statuspost drain placement on 04/25/2019; s/p placement of additional drain in ant lower abd/pelvis fluid collection 10/29; 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 No significant residual fluid collection at site of left transgluteal drainage catheter in the pelvic cul-de-sac.  Decreased size of small abscess in midline anterior abdominal wall soft tissues. Stable small abscess in right adnexal region.  No new or enlarging abscess identified within the pelvis or abdomen.  Cholelithiasis. No radiographic evidence of cholecystitis.  Aortic Atherosclerosis (ICD10-I70.0).  Case reviewed by Dr. Pascal Lux; rec once daily irrigation of ant drain with 5 cc sterile NS, no flushing of left TG drain; record drain outputs and change dressings prn; above d/w  pt; prescription for saline flushes given to pt along with output card; she will be scheduled for f/u in IR clinic next week   Electronically Signed: D. Rowe Robert, PA-C 05/04/2019, 10:56 AM   I spent a total of 20 minutes at the the patient's bedside AND on the patient's hospital floor or unit, greater than 50% of which was counseling/coordinating care for abd/pelvic fluid collection drains    Patient ID: Samantha Reed, female   DOB: 1971/02/09, 48 y.o.   MRN: [id number]

## 2019-05-05 ENCOUNTER — Telehealth: Payer: Self-pay | Admitting: *Deleted

## 2019-05-05 ENCOUNTER — Other Ambulatory Visit: Payer: Self-pay | Admitting: Obstetrics & Gynecology

## 2019-05-05 DIAGNOSIS — N739 Female pelvic inflammatory disease, unspecified: Secondary | ICD-10-CM

## 2019-05-05 LAB — AEROBIC/ANAEROBIC CULTURE W GRAM STAIN (SURGICAL/DEEP WOUND): Culture: NO GROWTH

## 2019-05-05 NOTE — Telephone Encounter (Signed)
Discharged home from hospital on 05/04/19. Patient is instructed to follow-up with OBGYN Dr. Dellis Filbert.

## 2019-05-08 ENCOUNTER — Other Ambulatory Visit: Payer: Self-pay

## 2019-05-08 ENCOUNTER — Ambulatory Visit (INDEPENDENT_AMBULATORY_CARE_PROVIDER_SITE_OTHER): Payer: 59 | Admitting: Obstetrics & Gynecology

## 2019-05-08 ENCOUNTER — Encounter: Payer: Self-pay | Admitting: Obstetrics & Gynecology

## 2019-05-08 VITALS — BP 128/74

## 2019-05-08 DIAGNOSIS — N739 Female pelvic inflammatory disease, unspecified: Secondary | ICD-10-CM

## 2019-05-08 DIAGNOSIS — Z09 Encounter for follow-up examination after completed treatment for conditions other than malignant neoplasm: Secondary | ICD-10-CM

## 2019-05-08 NOTE — Progress Notes (Signed)
    Samantha Reed Jan 11, 1971 [id number]        48 y.o.  [id number] Single  RP:  Post op TAH/Bilateral Salpingectomy 04/13/19 with readmission for postop Abdomen-pelvic abscesses  HPI: Readmission postop from 04/23/2021 to 05/04/2019 for abdomen-pelvic abscesses.  Good response to Zosyn IV and drainage of abscesses with 2 drains.  Patient has done very well at home since discharge.  No fever.  No abdominal pelvic pain.  No vaginal bleeding or discharge.  Eating well with no vomiting.  Gas positive.  Bowel movements normal.  Urine normal. The drains are not draining anymore.  Patient is successfully irrigated the anterior drain with 5 cc every day.  Continued antibiotic treatment with Augmentin twice a day prescribed for 2 weeks.   OB History  Gravida Para Term Preterm AB Living  '5 2     3 2  '$ SAB TAB Ectopic Multiple Live Births               # Outcome Date GA Lbr Len/2nd Weight Sex Delivery Anes PTL Lv  5 AB           4 AB           3 AB           2 Para           1 Para             Past medical history,surgical history, problem list, medications, allergies, family history and social history were all reviewed and documented in the EPIC chart.   Directed ROS with pertinent positives and negatives documented in the history of present illness/assessment and plan.  Exam:  Vitals:   05/08/19 1132  BP: 128/74   General appearance:  Normal  Abdomen: Abdomen soft and not distended.  Incision well-healed.  Drain sites intact, no sign of infection.  Anterior drain irrigated.  Minimal clear fluid in the drain pouches.  Gynecologic exam: Deferred.   Assessment/Plan:  48 y.o. [id number]   1. Status post gynecological surgery, follow-up exam Healing well post readmission for abdominal pelvic abscesses.  Surgery discussed with patient.  Pathology benign.  2. Pelvic abscess in female Clinically resolved postop abdominal pelvic abscesses.  Will repeat a CBC and CMP today.  Patient has an  appointment with intervention radiology to reassess the drains and probably remove them on November 10th.  Precautions reviewed. - CBC - Comp Met (CMET)  F/U with me in 3 weeks or before as needed.  Princess Bruins MD, 11:57 AM 05/08/2019

## 2019-05-09 ENCOUNTER — Encounter: Payer: Self-pay | Admitting: Obstetrics & Gynecology

## 2019-05-09 LAB — COMPREHENSIVE METABOLIC PANEL
AG Ratio: 0.9 (calc) — ABNORMAL LOW (ref 1.0–2.5)
ALT: 15 U/L (ref 6–29)
AST: 21 U/L (ref 10–35)
Albumin: 3.5 g/dL — ABNORMAL LOW (ref 3.6–5.1)
Alkaline phosphatase (APISO): 54 U/L (ref 31–125)
BUN: 8 mg/dL (ref 7–25)
CO2: 24 mmol/L (ref 20–32)
Calcium: 9.5 mg/dL (ref 8.6–10.2)
Chloride: 105 mmol/L (ref 98–110)
Creat: 0.75 mg/dL (ref 0.50–1.10)
Globulin: 4 g/dL (calc) — ABNORMAL HIGH (ref 1.9–3.7)
Glucose, Bld: 94 mg/dL (ref 65–99)
Potassium: 4 mmol/L (ref 3.5–5.3)
Sodium: 139 mmol/L (ref 135–146)
Total Bilirubin: 0.5 mg/dL (ref 0.2–1.2)
Total Protein: 7.5 g/dL (ref 6.1–8.1)

## 2019-05-09 LAB — CBC
HCT: 34.3 % — ABNORMAL LOW (ref 35.0–45.0)
Hemoglobin: 11.3 g/dL — ABNORMAL LOW (ref 11.7–15.5)
MCH: 30.5 pg (ref 27.0–33.0)
MCHC: 32.9 g/dL (ref 32.0–36.0)
MCV: 92.7 fL (ref 80.0–100.0)
MPV: 9.5 fL (ref 7.5–12.5)
Platelets: 467 10*3/uL — ABNORMAL HIGH (ref 140–400)
RBC: 3.7 10*6/uL — ABNORMAL LOW (ref 3.80–5.10)
RDW: 13.3 % (ref 11.0–15.0)
WBC: 7.7 10*3/uL (ref 3.8–10.8)

## 2019-05-09 NOTE — Patient Instructions (Signed)
1. Status post gynecological surgery, follow-up exam Healing well post readmission for abdominal pelvic abscesses.  Surgery discussed with patient.  Pathology benign.  2. Pelvic abscess in female Clinically resolved postop abdominal pelvic abscesses.  Will repeat a CBC and CMP today.  Patient has an appointment with intervention radiology to reassess the drains and probably remove them on November 10th.  Precautions reviewed. - CBC - Comp Met (CMET)  Samantha Reed, it was a pleasure seeing you today!  I will inform you of your results as soon as they are available.

## 2019-05-12 ENCOUNTER — Other Ambulatory Visit: Payer: 59

## 2019-05-12 ENCOUNTER — Ambulatory Visit
Admission: RE | Admit: 2019-05-12 | Discharge: 2019-05-12 | Disposition: A | Discharge status: Home / Self Care | Payer: 59 | Source: Ambulatory Visit | Attending: Radiology | Admitting: Radiology

## 2019-05-12 ENCOUNTER — Ambulatory Visit
Admission: RE | Admit: 2019-05-12 | Discharge: 2019-05-12 | Disposition: A | Discharge status: Home / Self Care | Payer: 59 | Source: Ambulatory Visit | Attending: Obstetrics & Gynecology | Admitting: Obstetrics & Gynecology

## 2019-05-12 DIAGNOSIS — N739 Female pelvic inflammatory disease, unspecified: Secondary | ICD-10-CM

## 2019-05-12 HISTORY — PX: IR RADIOLOGIST EVAL & MGMT: IMG5224

## 2019-05-12 MED ORDER — IOPAMIDOL (ISOVUE-300) INJECTION 61%
100.0000 mL | Freq: Once | INTRAVENOUS | Status: AC | PRN
  Administered 2019-05-12: 100 mL via INTRAVENOUS

## 2019-05-12 NOTE — Progress Notes (Signed)
Interventional Radiology Progress Note   48 yo female returns to [place] clinic, for eval of 2 drainage catheters, SP drainage of post-op infection.   She denies any fevers, rigors, chills.  She denies any new pain/discomfort.   Her drain log shows each drain has 5cc or less output per day.   She has both anterior approach drain and a left trans-gluteal drain.  Previous injection of the pelvic drain showed a fistula to vagina, without fistula to the bowel.   CT today shows no persisting abd/pelvic abscess.  Small fluid at the anterior abdominal wall is adjacent to the drain, but does not communicate with the drain side-holes.    After injection today to confirm no new fistula of the anterior drain, images/findings reviewed with the patient.  She prefers to have drains removed.   Drains removed today without incident.  Dry dressing applied. Advise no submerging the sites for at least 7 days, though showering is OK.    Signed,  Dulcy Fanny. Earleen Newport, DO

## 2019-05-22 ENCOUNTER — Telehealth: Payer: Self-pay

## 2019-05-22 NOTE — Telephone Encounter (Addendum)
Patient's disability company sent me a form to return and I need to give them estimated return to work date. I spoke with patient but she was not sure what the date was. She guess maybe 1st/2nd week of December. Please let me know what date I should put on form.  Of note her next follow up appt with you is 06/05/19.

## 2019-05-22 NOTE — Telephone Encounter (Signed)
Dr. Dellis Filbert recommended putting the day after her next visit at the est return to work date and she will reassess at the visit on 06/05/19.  The form was emailed to [employer] as they requested with the patient's okay.

## 2019-06-01 ENCOUNTER — Encounter: Payer: Self-pay | Admitting: Anesthesiology

## 2019-06-03 ENCOUNTER — Other Ambulatory Visit: Payer: Self-pay | Admitting: Internal Medicine

## 2019-06-03 DIAGNOSIS — F5101 Primary insomnia: Secondary | ICD-10-CM

## 2019-06-05 ENCOUNTER — Ambulatory Visit: Payer: 59 | Admitting: Obstetrics & Gynecology

## 2019-06-09 ENCOUNTER — Ambulatory Visit: Payer: 59 | Admitting: Obstetrics & Gynecology

## 2019-06-09 ENCOUNTER — Telehealth: Payer: Self-pay

## 2019-06-09 MED ORDER — GYNAZOLE-1 2 % VA CREA: TOPICAL_CREAM | VAGINAL | 0 refills | Status: DC

## 2019-06-09 NOTE — Telephone Encounter (Signed)
Please send Gynazole-1 (Butoconazole nitrate 2%) 1 applicator vaginally.

## 2019-06-09 NOTE — Telephone Encounter (Signed)
Patient had to cancel her appointment with you this afternoon due to car trouble.   She said she still has a yeast infection since the antibiotic. She said what you prescribed is "not working".  She asked for one night vaginal cream Rx.

## 2019-06-09 NOTE — Telephone Encounter (Addendum)
Rx sent. Patient informed. 

## 2019-06-17 ENCOUNTER — Other Ambulatory Visit: Payer: Self-pay

## 2019-06-18 ENCOUNTER — Encounter: Payer: Self-pay | Admitting: Obstetrics & Gynecology

## 2019-06-18 ENCOUNTER — Ambulatory Visit: Payer: 59 | Admitting: Obstetrics & Gynecology

## 2019-06-18 VITALS — BP 128/78

## 2019-06-18 DIAGNOSIS — N898 Other specified noninflammatory disorders of vagina: Secondary | ICD-10-CM | POA: Diagnosis not present

## 2019-06-18 LAB — WET PREP FOR TRICH, YEAST, CLUE

## 2019-06-18 MED ORDER — TINIDAZOLE 500 MG PO TABS: 1.0000 g | ORAL_TABLET | Freq: Two times a day (BID) | ORAL | 0 refills | Status: AC

## 2019-06-18 MED ORDER — FLUCONAZOLE 150 MG PO TABS: 150.0000 mg | ORAL_TABLET | Freq: Every day | ORAL | 4 refills | Status: AC

## 2019-06-18 NOTE — Progress Notes (Signed)
    Samantha Reed 08/13/1970 [id number]        48 y.o.  [id number]   RP: Vaginal discharge persisting post yeast treatment  HPI: Postop TAH complicated by pelvic abscesses.  Well healed with no abdomino-pelvic pain.  No fever.  C/O continued vaginal d/c with some odor, no itching.     OB History  Gravida Para Term Preterm AB Living  5 2     3 2   SAB TAB Ectopic Multiple Live Births               # Outcome Date GA Lbr Len/2nd Weight Sex Delivery Anes PTL Lv  5 AB           4 AB           3 AB           2 Para           1 Para             Past medical history,surgical history, problem list, medications, allergies, family history and social history were all reviewed and documented in the EPIC chart.   Directed ROS with pertinent positives and negatives documented in the history of present illness/assessment and plan.  Exam:  Vitals:   06/18/19 0838  BP: 128/78   General appearance:  Normal  Abdomen: Soft, not distended, NT.  Incision well-healed.  Gynecologic exam: Vulva normal.  Speculum:  Increased vaginal d/c, thick, yeasty.  Wet prep done.  Vaginal vault well healed.  Bimanual exam:  No pelvic mass, NT.  Wet prep:  Yeasts present, Clue cells present   Assessment/Plan:  48 y.o. [id number]   1. Vaginal discharge Wet prep confirming both a bacterial vaginosis and a yeast vaginitis.  Patient informed.  Management discussed with patient and decision made to treat with tinidazole first and then fluconazole.  Tinidazole usage reviewed, will take 2 tablets twice a day for 2 days.  Prescription sent to pharmacy.  Fluconazole usage reviewed, patient will take 1 tablet daily for 3 days after finishing the antibiotics.  May use again weekly as needed.  Prescription sent to pharmacy with refills. - WET PREP FOR [place], YEAST, CLUE  Other orders - tinidazole (TINDAMAX) 500 MG tablet; Take 2 tablets (1,000 mg total) by mouth 2 (two) times daily for 2 days. - fluconazole (DIFLUCAN)  150 MG tablet; Take 1 tablet (150 mg total) by mouth daily for 3 days.  Counseling on above issues and coordination of care more than 50% for 15 minutes.  Princess Bruins MD, 8:52 AM 06/18/2019

## 2019-06-18 NOTE — Patient Instructions (Signed)
1. Vaginal discharge Wet prep confirming both a bacterial vaginosis and a yeast vaginitis.  Patient informed.  Management discussed with patient and decision made to treat with tinidazole first and then fluconazole.  Tinidazole usage reviewed, will take 2 tablets twice a day for 2 days.  Prescription sent to pharmacy.  Fluconazole usage reviewed, patient will take 1 tablet daily for 3 days after finishing the antibiotics.  May use again weekly as needed.  Prescription sent to pharmacy with refills. - WET PREP FOR Samantha Reed, YEAST, CLUE  Other orders - tinidazole (TINDAMAX) 500 MG tablet; Take 2 tablets (1,000 mg total) by mouth 2 (two) times daily for 2 days. - fluconazole (DIFLUCAN) 150 MG tablet; Take 1 tablet (150 mg total) by mouth daily for 3 days.  Samantha Reed, it was a pleasure seeing you today!

## 2019-08-21 ENCOUNTER — Telehealth: Payer: Self-pay | Admitting: *Deleted

## 2019-08-21 NOTE — Telephone Encounter (Signed)
Patient called c/o hot flashes and night sweats, asked for recommendations for OTC medication? I was going to tell her Hoy Register, any other medications? Please advise

## 2019-08-21 NOTE — Telephone Encounter (Signed)
Patient informed. 

## 2019-08-21 NOTE — Telephone Encounter (Signed)
Agree with Hoy Register.  Black Kohash.  Soy products as food.

## 2020-01-13 ENCOUNTER — Encounter: Payer: Self-pay | Admitting: Internal Medicine

## 2020-01-13 ENCOUNTER — Ambulatory Visit (INDEPENDENT_AMBULATORY_CARE_PROVIDER_SITE_OTHER): Payer: 59 | Admitting: Internal Medicine

## 2020-01-13 ENCOUNTER — Ambulatory Visit (INDEPENDENT_AMBULATORY_CARE_PROVIDER_SITE_OTHER)
Admission: RE | Admit: 2020-01-13 | Discharge: 2020-01-13 | Disposition: A | Discharge status: Home / Self Care | Payer: 59 | Source: Ambulatory Visit | Attending: Internal Medicine | Admitting: Internal Medicine

## 2020-01-13 ENCOUNTER — Other Ambulatory Visit: Payer: Self-pay

## 2020-01-13 ENCOUNTER — Other Ambulatory Visit: Payer: Self-pay | Admitting: Internal Medicine

## 2020-01-13 VITALS — BP 180/102 | HR 69 | Temp 99.5°F | Resp 16 | Ht 66.0 in | Wt 153.0 lb

## 2020-01-13 DIAGNOSIS — M542 Cervicalgia: Secondary | ICD-10-CM

## 2020-01-13 DIAGNOSIS — G8929 Other chronic pain: Secondary | ICD-10-CM

## 2020-01-13 DIAGNOSIS — Z0000 Encounter for general adult medical examination without abnormal findings: Secondary | ICD-10-CM

## 2020-01-13 DIAGNOSIS — Z1231 Encounter for screening mammogram for malignant neoplasm of breast: Secondary | ICD-10-CM | POA: Diagnosis not present

## 2020-01-13 DIAGNOSIS — E785 Hyperlipidemia, unspecified: Secondary | ICD-10-CM

## 2020-01-13 DIAGNOSIS — I10 Essential (primary) hypertension: Secondary | ICD-10-CM

## 2020-01-13 DIAGNOSIS — Z1211 Encounter for screening for malignant neoplasm of colon: Secondary | ICD-10-CM

## 2020-01-13 DIAGNOSIS — F5101 Primary insomnia: Secondary | ICD-10-CM

## 2020-01-13 DIAGNOSIS — M47812 Spondylosis without myelopathy or radiculopathy, cervical region: Secondary | ICD-10-CM

## 2020-01-13 DIAGNOSIS — D500 Iron deficiency anemia secondary to blood loss (chronic): Secondary | ICD-10-CM

## 2020-01-13 MED ORDER — TRIAMTERENE-HCTZ 37.5-25 MG PO CAPS: 1.0000 | ORAL_CAPSULE | Freq: Every day | ORAL | 0 refills | Status: DC

## 2020-01-13 MED ORDER — NEBIVOLOL HCL 5 MG PO TABS: 5.0000 mg | ORAL_TABLET | Freq: Every day | ORAL | 0 refills | Status: DC

## 2020-01-13 MED ORDER — ZOLPIDEM TARTRATE 10 MG PO TABS: ORAL_TABLET | ORAL | 0 refills | Status: DC

## 2020-01-13 NOTE — Patient Instructions (Signed)

## 2020-01-13 NOTE — Progress Notes (Signed)
Subjective:  Patient ID: Samantha Reed, female    DOB: 21-Mar-1971  Age: 49 y.o. MRN: [id number]  CC: Annual Exam, Anemia, and Hypertension  This visit occurred during the SARS-CoV-2 public health emergency.  Safety protocols were in place, including screening questions prior to the visit, additional usage of staff PPE, and extensive cleaning of exam room while observing appropriate contact time as indicated for disinfecting solutions.    HPI Samantha Reed presents for a CPX.  She has a history of hypertension but is not currently taking any antihypertensives.  She has had mild intermittent blurred vision but she denies headache, nausea, vomiting, chest pain, shortness of breath, dyspnea on exertion, diaphoresis, edema or, or fatigue.  She complains of chronic insomnia with difficulty falling asleep and frequent awakening and wants a refill of zolpidem.  She complains of a 66-month history of intermittent neck pain.  She denies trauma or injury.  The neck pain does not radiate towards her extremities and she denies paresthesias.  She is not currently taking anything for the discomfort.  Outpatient Medications Prior to Visit  Medication Sig Dispense Refill  . Butoconazole Nitrate, 1 Dose, (GYNAZOLE-1) 2 % CREA Insert intravaginally hs x one night. (Patient not taking: Reported on 01/13/2020) 5.8 g 0  . ibuprofen (ADVIL) 200 MG tablet Take 200 mg by mouth every 6 (six) hours as needed for moderate pain. (Patient not taking: Reported on 01/13/2020)    . loratadine (CLARITIN) 10 MG tablet Take 10 mg by mouth daily as needed for allergies. (Patient not taking: Reported on 01/13/2020)    . rosuvastatin (CRESTOR) 5 MG tablet Take 1 tablet (5 mg total) by mouth daily. (Patient not taking: Reported on 01/13/2020) 90 tablet 1  . spironolactone (ALDACTONE) 25 MG tablet Take 1 tablet (25 mg total) by mouth daily. (Patient not taking: Reported on 01/13/2020) 90 tablet 1  . VITAMIN E PO Take 3 tablets  by mouth daily.  (Patient not taking: Reported on 01/13/2020)    . zolpidem (AMBIEN) 10 MG tablet TAKE 1 TABLET BY MOUTH EVERY DAY AT BEDTIME AS NEEDED FOR SLEEP (Patient not taking: Reported on 01/13/2020) 30 tablet 1   No facility-administered medications prior to visit.    ROS Review of Systems  Constitutional: Negative.  Negative for chills, diaphoresis, fatigue and fever.  HENT: Negative.  Negative for sore throat and trouble swallowing.   Eyes: Positive for visual disturbance.  Respiratory: Negative for cough, chest tightness, shortness of breath and wheezing.   Cardiovascular: Negative for chest pain, palpitations and leg swelling.  Gastrointestinal: Negative for abdominal pain, blood in stool, constipation, diarrhea, nausea and vomiting.  Endocrine: Negative.   Genitourinary: Negative.  Negative for decreased urine volume, difficulty urinating and dysuria.  Musculoskeletal: Positive for neck pain. Negative for arthralgias, back pain and myalgias.  Skin: Negative.  Negative for color change, pallor and rash.  Neurological: Negative.  Negative for dizziness, speech difficulty, weakness, light-headedness, numbness and headaches.  Hematological: Negative for adenopathy. Does not bruise/bleed easily.  Psychiatric/Behavioral: Negative.     Objective:  BP (!) 180/102 (BP Location: Left Arm, Patient Position: Sitting, Cuff Size: Normal)   Pulse 69   Temp 99.5 F (37.5 C) (Oral)   Resp 16   Ht 5\' 6"  (1.676 m)   Wt 153 lb (69.4 kg)   LMP 11/03/2018   SpO2 98%   BMI 24.69 kg/m   BP Readings from Last 3 Encounters:  01/13/20 (!) 180/102  06/18/19 128/78  05/12/19 139/62  Wt Readings from Last 3 Encounters:  01/13/20 153 lb (69.4 kg)  04/27/19 160 lb (72.6 kg)  04/13/19 160 lb (72.6 kg)    Physical Exam Vitals reviewed.  Constitutional:      Appearance: Normal appearance. She is not ill-appearing.  HENT:     Nose: Nose normal.     Mouth/Throat:     Mouth: Mucous  membranes are moist.  Eyes:     General: No scleral icterus.    Conjunctiva/sclera: Conjunctivae normal.  Cardiovascular:     Rate and Rhythm: Normal rate and regular rhythm.     Heart sounds: No murmur heard.   Pulmonary:     Effort: Pulmonary effort is normal.     Breath sounds: No stridor. No wheezing, rhonchi or rales.  Abdominal:     General: Abdomen is flat. Bowel sounds are normal. There is no distension.     Palpations: Abdomen is soft. There is no hepatomegaly, splenomegaly or mass.     Tenderness: There is no abdominal tenderness.     Hernia: No hernia is present.  Musculoskeletal:        General: Normal range of motion.     Cervical back: Normal and neck supple. No swelling, edema, deformity, signs of trauma, rigidity, spasms, tenderness or bony tenderness. No pain with movement. Normal range of motion.     Thoracic back: Normal.     Lumbar back: Normal.     Right lower leg: No edema.     Left lower leg: No edema.  Lymphadenopathy:     Cervical: No cervical adenopathy.  Skin:    General: Skin is warm and dry.     Coloration: Skin is not pale.  Neurological:     General: No focal deficit present.     Mental Status: She is alert and oriented to person, place, and time. Mental status is at baseline.  Psychiatric:        Mood and Affect: Mood normal.        Behavior: Behavior normal.        Judgment: Judgment normal.     Lab Results  Component Value Date   WBC 4.8 01/13/2020   HGB 16.6 (H) 01/13/2020   HCT 48.5 (H) 01/13/2020   PLT 296 01/13/2020   GLUCOSE 51 (L) 01/13/2020   CHOL 212 (H) 01/13/2020   TRIG 123 01/13/2020   HDL 39 (L) 01/13/2020   LDLCALC 148 (H) 01/13/2020   ALT 12 01/13/2020   AST 15 01/13/2020   NA 138 01/13/2020   K 4.7 01/13/2020   CL 101 01/13/2020   CREATININE 0.79 01/13/2020   BUN 6 (L) 01/13/2020   CO2 22 01/13/2020   TSH 1.66 01/13/2020   INR 1.2 04/25/2019    CT ABDOMEN PELVIS W CONTRAST  Result Date:  05/12/2019 CLINICAL DATA:  49 year old female with a history of hysterectomy and abdominal/pelvic abscess. Initial drainage of a left trans gluteal approach drain 04/25/2019 with 12 French drain placed. Prior injection 04/30/2019 demonstrates fistula to the vagina. Subsequent anterior drain placement 04/30/2019 into low abdominal abscess. Patient returns today for repeat assessment. EXAM: CT ABDOMEN AND PELVIS WITH CONTRAST TECHNIQUE: Multidetector CT imaging of the abdomen and pelvis was performed using the standard protocol following bolus administration of intravenous contrast. CONTRAST:  151mL ISOVUE-300 IOPAMIDOL (ISOVUE-300) INJECTION 61% COMPARISON:  05/03/2019, 04/28/2019, 04/24/2019 FINDINGS: Lower chest: No acute finding of the lower chest Hepatobiliary: Redemonstration of small nonenhancing rounded liver lesions within right and left liver, compatible with  biliary cyst. Minimal hyperdense material in the inferior gallbladder compatible with cholelithiasis with no inflammatory changes. Pancreas: Unremarkable Spleen: Unremarkable Adrenals/Urinary Tract: - Right adrenal gland:  Unremarkable - Left adrenal gland: Unremarkable. - Right kidney: No hydronephrosis, nephrolithiasis, inflammation, or ureteral dilation. No focal lesion. - Left Kidney: No hydronephrosis, nephrolithiasis, inflammation, or ureteral dilation. No focal lesion. - Urinary Bladder: Decompressed urinary bladder. Stomach/Bowel: - Stomach: Unremarkable. - Small bowel: Unremarkable - Appendix: Appendix is not visualized, however, no inflammatory changes are present adjacent to the cecum to indicate an appendicitis. - Colon: Unremarkable. Vascular/Lymphatic: Atherosclerotic changes of the abdominal aorta. No aneurysm. No inflammatory changes. Bilateral iliac arteries and proximal femoral arteries are patent. High bifurcation of the left common femoral artery. Reproductive: Surgical changes of hysterectomy. Anterior approach drain catheter  terminates within the left pelvis. No residual fluid collection. There is trace fluid adjacent to the catheter tract at the anterior abdominal wall, which measures less than 4 cm in AP diameter and 1 cm in thickness. This fluid collection does not communicate with the holes of the catheter. Left transgluteal approach pelvic drainage catheter is unchanged in position. No residual abscess. Mild edema/fluid within the peritoneal surfaces of the pelvis, similar to the comparison with no focal fluid collection persisting. No free air. The previously identified fluid collection within the right pelvis, remote from the catheters, is essentially resolved. Other: Edema of the anterior abdominal wall. Surgical changes of low abdomen compatible given history of hysterectomy. Musculoskeletal: No acute displaced fracture. Degenerative changes of the spine. IMPRESSION: Unchanged position of the anterior approach and posterior approach drainage catheters within the abdomen/pelvis, with no evidence of residual abscess at the catheter drainage sites. The fluid collection within the right pelvis adjacent to the right adnexa is essentially resolved. Mild persisting inflammatory changes/edema within the abdomen/pelvis, status post hysterectomy. Nonspecific fluid collection within the anterior abdominal wall adjacent to the anterior approach drain catheter. This does not communicate with the side holes of the drainage catheter, and may reflect seroma/postsurgical change, partially liquified hematoma, or small residual abscess. Aortic Atherosclerosis (ICD10-I70.0). Additional ancillary findings as above. Electronically Signed   By: Corrie Mckusick D.O.   On: 05/12/2019 12:17   DG Sinus/Fist Tube Chk-Non GI  Result Date: 05/12/2019 INDICATION: 49 year old female with a history of abdominal and pelvic abscess EXAM: DRAIN INJECTION MEDICATIONS: None ANESTHESIA/SEDATION: None COMPLICATIONS: None PROCEDURE: Informed written consent was  obtained from the patient after a thorough discussion of the procedural risks, benefits and alternatives. All questions were addressed. Maximal Sterile Barrier Technique was utilized including caps, mask, sterile gowns, sterile gloves, sterile drape, hand hygiene and skin antiseptic. A timeout was performed prior to the initiation of the procedure. Patient positioned supine position on the fluoroscopy table. Scout images were acquired. The anterior/abdominal drain was injected under fluoroscopy. No abscess cavity was identified. Reflux of contrast through the skin identified. Given current findings on the injection, current CT findings, and findings on prior injection and CT drains were removed. Upon withdrawal of the abdominal drain aspiration was performed. Dry dressings were placed. Patient tolerated the procedure well and remained hemodynamically stable throughout. No complications were encountered and no significant blood loss. IMPRESSION: Injection of the anterior/abdominal drain identifies no residual abscess or fistula with reflux of contrast through the skin. Both the pelvic and anterior abdominal drains were removed. Electronically Signed   By: Corrie Mckusick D.O.   On: 05/12/2019 12:11   IR Radiologist Eval & Mgmt  Result Date: 05/12/2019 Please refer to notes  tab for details about interventional procedure. (Op Note)   DG Cervical Spine Complete  Result Date: 01/14/2020 CLINICAL DATA:  Cervicalgia EXAM: CERVICAL SPINE - COMPLETE 4+ VIEW COMPARISON:  None. FINDINGS: Frontal, lateral, open-mouth odontoid, and bilateral oblique views were obtained. There is no fracture or spondylolisthesis. Prevertebral soft tissues and predental space regions are normal. There is moderately severe disc space narrowing at C6-7. There is milder disc space narrowing at C4-5 and C5-6. There are anterior osteophytes at C4, C5, and C6. There are foci of calcification in the anterior ligament at C3-4, C4-5, C5-6, and  C6-7. There is facet hypertrophy with exit foraminal narrowing on the right at C3-4, C4-5, C5-6, and C6-7 and on the left at C6-7. Lung apices are clear. IMPRESSION: Osteoarthritic change at several levels, most notably at C6-7. No fracture or spondylolisthesis. Electronically Signed   By: Lowella Grip III M.D.   On: 01/14/2020 15:37    Assessment & Plan:   Lemon was seen today for annual exam, anemia and hypertension.  Diagnoses and all orders for this visit:  Encounter for screening mammogram for malignant neoplasm of breast -     MM Digital Screening; Future  Colon cancer screening -     Cologuard  Iron deficiency anemia due to chronic blood loss- Her H/H are high consistent with a history of tobacco abuse.  Iron replacement therapy is not indicated. -     Iron and TIBC; Future -     CBC with Differential/Platelet; Future -     Ferritin; Future -     Ferritin -     CBC with Differential/Platelet -     Iron and TIBC  Routine general medical examination at a health care facility- Exam completed, labs reviewed, vaccines reviewed and updated, she is referred for screening for breast and colon cancer, cervical cancer screening is up-to-date, patient education material was given. -     Lipid panel; Future -     Lipid panel  Essential hypertension, benign- Her blood pressure is not adequately well controlled and she is symptomatic with blurred vision.  I recommended that she take the combination of triamterene, hydrochlorothiazide, and nebivolol.  I strongly encouraged her to quit smoking. -     triamterene-hydrochlorothiazide (DYAZIDE) 37.5-25 MG capsule; Take 1 each (1 capsule total) by mouth daily. -     Basic metabolic panel; Future -     Urinalysis, Routine w reflex microscopic; Future -     Urine drugs of abuse scrn w alc, routine (Ref Lab); Future -     TSH; Future -     Discontinue: nebivolol (BYSTOLIC) 5 MG tablet; Take 1 tablet (5 mg total) by mouth daily. -     TSH -      Urine drugs of abuse scrn w alc, routine (Ref Lab) -     Urinalysis, Routine w reflex microscopic -     Basic metabolic panel -     carvedilol (COREG) 3.125 MG tablet; Take 1 tablet (3.125 mg total) by mouth 2 (two) times daily with a meal.  Hyperlipidemia LDL goal <130- She has an elevated ASCVD risk score so I have asked her to take a statin for CV risk reduction. -     TSH; Future -     Hepatic function panel; Future -     Hepatic function panel -     TSH -     rosuvastatin (CRESTOR) 10 MG tablet; Take 1 tablet (10 mg total) by mouth  daily.  Primary insomnia- Will screen for substance abuse.  Will continue zolpidem as needed. -     zolpidem (AMBIEN) 10 MG tablet; TAKE 1 TABLET BY MOUTH EVERY DAY AT BEDTIME AS NEEDED FOR SLEEP -     Urine drugs of abuse scrn w alc, routine (Ref Lab); Future -     Urine drugs of abuse scrn w alc, routine (Ref Lab)  Chronic neck pain- Based on her symptoms, exam, and plain films she has osteoarthritis of the cervical spine with no evidence of myelopathy or radiculopathy.  I recommended that she treat this with a once daily dose of meloxicam. -     DG Cervical Spine Complete; Future -     meloxicam (MOBIC) 7.5 MG tablet; Take 1 tablet (7.5 mg total) by mouth daily.  Spondylosis of cervical region without myelopathy or radiculopathy -     meloxicam (MOBIC) 7.5 MG tablet; Take 1 tablet (7.5 mg total) by mouth daily.  Other orders -     SPECIMEN COMPROMISED -     SPECIMEN COMPROMISED -     MICROSCOPIC MESSAGE -     Iron,Total/Total Iron Binding Cap   I have discontinued Che Limbach's spironolactone, rosuvastatin, VITAMIN E PO, loratadine, ibuprofen, Gynazole-1, and nebivolol. I am also having her start on triamterene-hydrochlorothiazide, rosuvastatin, carvedilol, and meloxicam. Additionally, I am having her maintain her zolpidem.  Meds ordered this encounter  Medications  . zolpidem (AMBIEN) 10 MG tablet    Sig: TAKE 1 TABLET BY MOUTH EVERY  DAY AT BEDTIME AS NEEDED FOR SLEEP    Dispense:  90 tablet    Refill:  0    This request is for a new prescription for a controlled substance as required by Federal/State law.  . triamterene-hydrochlorothiazide (DYAZIDE) 37.5-25 MG capsule    Sig: Take 1 each (1 capsule total) by mouth daily.    Dispense:  90 capsule    Refill:  0  . DISCONTD: nebivolol (BYSTOLIC) 5 MG tablet    Sig: Take 1 tablet (5 mg total) by mouth daily.    Dispense:  90 tablet    Refill:  0  . rosuvastatin (CRESTOR) 10 MG tablet    Sig: Take 1 tablet (10 mg total) by mouth daily.    Dispense:  90 tablet    Refill:  1  . carvedilol (COREG) 3.125 MG tablet    Sig: Take 1 tablet (3.125 mg total) by mouth 2 (two) times daily with a meal.    Dispense:  180 tablet    Refill:  0  . meloxicam (MOBIC) 7.5 MG tablet    Sig: Take 1 tablet (7.5 mg total) by mouth daily.    Dispense:  90 tablet    Refill:  0   In addition to time spent on CPE, I spent 60 minutes in preparing to see the patient by review of recent labs, imaging and procedures, obtaining and reviewing separately obtained history, communicating with the patient and family or caregiver, ordering medications, tests or procedures, and documenting clinical information in the EHR including the differential Dx, treatment, and any further evaluation and other management of ... 1. Iron deficiency anemia due to chronic blood loss 2. Essential hypertension, benign 3. Hyperlipidemia LDL goal <130 4. Primary insomnia 5. Chronic neck pain 6. Spondylosis of cervical region without myelopathy or radiculopathy     Follow-up: Return in about 4 weeks (around 02/10/2020).  Scarlette Calico, MD

## 2020-01-14 ENCOUNTER — Telehealth: Payer: Self-pay

## 2020-01-14 LAB — CBC WITH DIFFERENTIAL/PLATELET
Absolute Monocytes: 360 cells/uL (ref 200–950)
Basophils Absolute: 48 cells/uL (ref 0–200)
Basophils Relative: 1 %
Eosinophils Absolute: 48 cells/uL (ref 15–500)
Eosinophils Relative: 1 %
HCT: 48.5 % — ABNORMAL HIGH (ref 35.0–45.0)
Hemoglobin: 16.6 g/dL — ABNORMAL HIGH (ref 11.7–15.5)
Lymphs Abs: 2664 cells/uL (ref 850–3900)
MCH: 32.7 pg (ref 27.0–33.0)
MCHC: 34.2 g/dL (ref 32.0–36.0)
MCV: 95.7 fL (ref 80.0–100.0)
MPV: 10.1 fL (ref 7.5–12.5)
Monocytes Relative: 7.5 %
Neutro Abs: 1680 cells/uL (ref 1500–7800)
Neutrophils Relative %: 35 %
Platelets: 296 10*3/uL (ref 140–400)
RBC: 5.07 10*6/uL (ref 3.80–5.10)
RDW: 12.4 % (ref 11.0–15.0)
Total Lymphocyte: 55.5 %
WBC: 4.8 10*3/uL (ref 3.8–10.8)

## 2020-01-14 LAB — LIPID PANEL
Cholesterol: 212 mg/dL — ABNORMAL HIGH (ref <200)
HDL: 39 mg/dL — ABNORMAL LOW (ref >=50)
LDL Cholesterol (Calc): 148 mg/dL (calc) — ABNORMAL HIGH
Non-HDL Cholesterol (Calc): 173 mg/dL (calc) — ABNORMAL HIGH (ref <130)
Total CHOL/HDL Ratio: 5.4 (calc) — ABNORMAL HIGH (ref <5.0)
Triglycerides: 123 mg/dL (ref <150)

## 2020-01-14 LAB — HEPATIC FUNCTION PANEL
AG Ratio: 1.2 (calc) (ref 1.0–2.5)
ALT: 12 U/L (ref 6–29)
AST: 15 U/L (ref 10–35)
Albumin: 4.3 g/dL (ref 3.6–5.1)
Alkaline phosphatase (APISO): 91 U/L (ref 37–153)
Bilirubin, Direct: 0.1 mg/dL (ref 0.0–0.2)
Globulin: 3.6 g/dL (calc) (ref 1.9–3.7)
Indirect Bilirubin: 0.5 mg/dL (calc) (ref 0.2–1.2)
Total Bilirubin: 0.6 mg/dL (ref 0.2–1.2)
Total Protein: 7.9 g/dL (ref 6.1–8.1)

## 2020-01-14 LAB — URINALYSIS, ROUTINE W REFLEX MICROSCOPIC
Bilirubin Urine: NEGATIVE
Glucose, UA: NEGATIVE
Hyaline Cast: NONE SEEN /LPF
Ketones, ur: NEGATIVE
Leukocytes,Ua: NEGATIVE
Nitrite: POSITIVE — AB
Protein, ur: NEGATIVE
Specific Gravity, Urine: 1.007 (ref 1.001–1.03)
pH: 6 (ref 5.0–8.0)

## 2020-01-14 LAB — BASIC METABOLIC PANEL
BUN/Creatinine Ratio: 8 (calc) (ref 6–22)
BUN: 6 mg/dL — ABNORMAL LOW (ref 7–25)
CO2: 22 mmol/L (ref 20–32)
Calcium: 9.8 mg/dL (ref 8.6–10.4)
Chloride: 101 mmol/L (ref 98–110)
Creat: 0.79 mg/dL (ref 0.50–1.05)
Glucose, Bld: 51 mg/dL — ABNORMAL LOW (ref 65–99)
Potassium: 4.7 mmol/L (ref 3.5–5.3)
Sodium: 138 mmol/L (ref 135–146)

## 2020-01-14 LAB — SPECIMEN COMPROMISED

## 2020-01-14 LAB — IRON, TOTAL/TOTAL IRON BINDING CAP
%SAT: 25 % (calc) (ref 16–45)
Iron: 87 ug/dL (ref 45–160)
TIBC: 345 mcg/dL (calc) (ref 250–450)

## 2020-01-14 LAB — FERRITIN: Ferritin: 298 ng/mL — ABNORMAL HIGH (ref 16–232)

## 2020-01-14 LAB — TSH: TSH: 1.66 mIU/L

## 2020-01-14 MED ORDER — ROSUVASTATIN CALCIUM 10 MG PO TABS: 10.0000 mg | ORAL_TABLET | Freq: Every day | ORAL | 1 refills | Status: DC

## 2020-01-14 MED ORDER — CARVEDILOL 3.125 MG PO TABS: 3.1250 mg | ORAL_TABLET | Freq: Two times a day (BID) | ORAL | 0 refills | Status: DC

## 2020-01-14 NOTE — Telephone Encounter (Signed)
New message    Calling for test results on neck done at [facility] office.    Pt c/o medication issue:  1. Name of Medication: nebivolol (BYSTOLIC) 5 MG tablet  2. How are you currently taking this medication (dosage and times per day)? Once a day   3. Are you having a reaction (difficulty breathing--STAT)? No   4. What is your medication issue? The cost  $ 35.00 is too much for her pay each month - looking for cheaper options

## 2020-01-17 DIAGNOSIS — M47812 Spondylosis without myelopathy or radiculopathy, cervical region: Secondary | ICD-10-CM | POA: Insufficient documentation

## 2020-01-17 MED ORDER — MELOXICAM 7.5 MG PO TABS: 7.5000 mg | ORAL_TABLET | Freq: Every day | ORAL | 0 refills | Status: DC

## 2020-01-20 LAB — URINE DRUGS OF ABUSE SCREEN W ALC, ROUTINE (REF LAB)
Amphetamines, Urine: NEGATIVE ng/mL
Barbiturate Quant, Ur: NEGATIVE ng/mL
Benzodiazepine Quant, Ur: NEGATIVE ng/mL
Cocaine (Metab.): NEGATIVE ng/mL
Ethanol, Urine: NEGATIVE %
Methadone Screen, Urine: NEGATIVE ng/mL
Opiate Quant, Ur: NEGATIVE ng/mL
PCP Quant, Ur: NEGATIVE ng/mL
Propoxyphene: NEGATIVE ng/mL

## 2020-01-20 LAB — PANEL 799049
CARBOXY THC GC/MS CONF: 182 ng/mL
Cannabinoid GC/MS, Ur: POSITIVE — AB

## 2020-01-29 LAB — COLOGUARD
COLOGUARD: NEGATIVE
Cologuard: NEGATIVE

## 2020-01-29 LAB — EXTERNAL GENERIC LAB PROCEDURE: COLOGUARD: NEGATIVE

## 2020-02-03 ENCOUNTER — Encounter: Payer: Self-pay | Admitting: Internal Medicine

## 2020-02-05 LAB — HM MAMMOGRAPHY

## 2020-02-19 ENCOUNTER — Encounter: Payer: Self-pay | Admitting: Internal Medicine

## 2020-05-24 ENCOUNTER — Ambulatory Visit: Payer: 59 | Admitting: Internal Medicine

## 2020-05-24 ENCOUNTER — Other Ambulatory Visit: Payer: Self-pay

## 2020-05-24 ENCOUNTER — Encounter: Payer: Self-pay | Admitting: Internal Medicine

## 2020-05-24 VITALS — BP 178/90 | HR 68 | Temp 98.2°F | Resp 16 | Ht 66.0 in | Wt 153.0 lb

## 2020-05-24 DIAGNOSIS — Z720 Tobacco use: Secondary | ICD-10-CM

## 2020-05-24 DIAGNOSIS — M47812 Spondylosis without myelopathy or radiculopathy, cervical region: Secondary | ICD-10-CM

## 2020-05-24 DIAGNOSIS — I10 Essential (primary) hypertension: Secondary | ICD-10-CM | POA: Diagnosis not present

## 2020-05-24 LAB — CBC WITH DIFFERENTIAL/PLATELET
Basophils Absolute: 0.1 10*3/uL (ref 0.0–0.1)
Basophils Relative: 1 % (ref 0.0–3.0)
Eosinophils Absolute: 0.1 10*3/uL (ref 0.0–0.7)
Eosinophils Relative: 2.2 % (ref 0.0–5.0)
HCT: 46.6 % — ABNORMAL HIGH (ref 36.0–46.0)
Hemoglobin: 15.8 g/dL — ABNORMAL HIGH (ref 12.0–15.0)
Lymphocytes Relative: 51.6 % — ABNORMAL HIGH (ref 12.0–46.0)
Lymphs Abs: 3.1 10*3/uL (ref 0.7–4.0)
MCHC: 33.9 g/dL (ref 30.0–36.0)
MCV: 97.7 fl (ref 78.0–100.0)
Monocytes Absolute: 0.5 10*3/uL (ref 0.1–1.0)
Monocytes Relative: 7.8 % (ref 3.0–12.0)
Neutro Abs: 2.3 10*3/uL (ref 1.4–7.7)
Neutrophils Relative %: 37.4 % — ABNORMAL LOW (ref 43.0–77.0)
Platelets: 246 10*3/uL (ref 150.0–400.0)
RBC: 4.78 Mil/uL (ref 3.87–5.11)
RDW: 15.3 % (ref 11.5–15.5)
WBC: 6.1 10*3/uL (ref 4.0–10.5)

## 2020-05-24 LAB — BASIC METABOLIC PANEL
BUN: 10 mg/dL (ref 6–23)
CO2: 30 mEq/L (ref 19–32)
Calcium: 9.5 mg/dL (ref 8.4–10.5)
Chloride: 103 mEq/L (ref 96–112)
Creatinine, Ser: 0.89 mg/dL (ref 0.40–1.20)
GFR: 75.46 mL/min (ref >60.00)
Glucose, Bld: 68 mg/dL — ABNORMAL LOW (ref 70–99)
Potassium: 3.3 mEq/L — ABNORMAL LOW (ref 3.5–5.1)
Sodium: 139 mEq/L (ref 135–145)

## 2020-05-24 MED ORDER — VARENICLINE TARTRATE 1 MG PO TABS: 1.0000 mg | ORAL_TABLET | Freq: Two times a day (BID) | ORAL | 3 refills | Status: DC

## 2020-05-24 MED ORDER — CHANTIX STARTING MONTH PAK 0.5 MG X 11 & 1 MG X 42 PO TABS: ORAL_TABLET | ORAL | 0 refills | Status: DC

## 2020-05-24 MED ORDER — TRIAMTERENE-HCTZ 37.5-25 MG PO CAPS: 1.0000 | ORAL_CAPSULE | Freq: Every day | ORAL | 0 refills | Status: DC

## 2020-05-24 MED ORDER — NEBIVOLOL HCL 10 MG PO TABS: 10.0000 mg | ORAL_TABLET | Freq: Every day | ORAL | 0 refills | Status: DC

## 2020-05-24 NOTE — Progress Notes (Signed)
Subjective:  Patient ID: Samantha Reed, female    DOB: May 23, 1971  Age: 49 y.o. MRN: [id number]  CC: Hypertension  This visit occurred during the SARS-CoV-2 public health emergency.  Safety protocols were in place, including screening questions prior to the visit, additional usage of staff PPE, and extensive cleaning of exam room while observing appropriate contact time as indicated for disinfecting solutions.    HPI Samantha Reed presents for f/up - She complains that her blood pressure is not adequately well controlled.  For the last 3 weeks she has had intermittent headaches and dizziness.  She denies blurred vision, nausea, vomiting, chest pain, shortness of breath, palpitations, edema, or fatigue.  She is not taking the Dyazide - She said she ran out.  She is taking carvedilol but rather than taking it twice a day she is taking both of the doses in the morning and she thinks it makes her feel worse.  She would rather not take a twice a day medication.  She is also motivated to quit smoking.  She complains of intermittent, nonradiating neck pain.  She would like to see a physical therapist about this.  Outpatient Medications Prior to Visit  Medication Sig Dispense Refill  . rosuvastatin (CRESTOR) 10 MG tablet Take 1 tablet (10 mg total) by mouth daily. 90 tablet 1  . zolpidem (AMBIEN) 10 MG tablet TAKE 1 TABLET BY MOUTH EVERY DAY AT BEDTIME AS NEEDED FOR SLEEP 90 tablet 0  . carvedilol (COREG) 3.125 MG tablet Take 1 tablet (3.125 mg total) by mouth 2 (two) times daily with a meal. 180 tablet 0  . meloxicam (MOBIC) 7.5 MG tablet Take 1 tablet (7.5 mg total) by mouth daily. 90 tablet 0  . triamterene-hydrochlorothiazide (DYAZIDE) 37.5-25 MG capsule Take 1 each (1 capsule total) by mouth daily. 90 capsule 0   No facility-administered medications prior to visit.    ROS Review of Systems  Constitutional: Negative for appetite change, diaphoresis, fatigue and unexpected weight  change.  HENT: Negative.   Eyes: Negative for photophobia, pain and visual disturbance.  Respiratory: Negative for cough, chest tightness, shortness of breath and wheezing.   Cardiovascular: Negative for chest pain, palpitations and leg swelling.  Gastrointestinal: Negative for abdominal pain, constipation, diarrhea, nausea and vomiting.  Endocrine: Negative.   Genitourinary: Negative.  Negative for difficulty urinating.  Musculoskeletal: Positive for neck pain. Negative for back pain.  Skin: Negative.   Neurological: Positive for dizziness and headaches. Negative for seizures, syncope, facial asymmetry, weakness and numbness.  Hematological: Negative for adenopathy. Does not bruise/bleed easily.  Psychiatric/Behavioral: Negative.     Objective:  BP (!) 178/90   Pulse 68   Temp 98.2 F (36.8 C) (Oral)   Resp 16   Ht 5\' 6"  (1.676 m)   Wt 153 lb (69.4 kg)   LMP 11/03/2018   SpO2 97%   BMI 24.69 kg/m   BP Readings from Last 3 Encounters:  05/24/20 (!) 178/90  01/13/20 (!) 180/102  06/18/19 128/78    Wt Readings from Last 3 Encounters:  05/24/20 153 lb (69.4 kg)  01/13/20 153 lb (69.4 kg)  04/27/19 160 lb (72.6 kg)    Physical Exam Vitals reviewed.  Constitutional:      Appearance: Normal appearance.  HENT:     Nose: Nose normal.     Mouth/Throat:     Mouth: Mucous membranes are moist.  Eyes:     General: No scleral icterus.    Conjunctiva/sclera: Conjunctivae normal.  Cardiovascular:  Rate and Rhythm: Normal rate and regular rhythm.     Heart sounds: No murmur heard.   Pulmonary:     Effort: Pulmonary effort is normal.     Breath sounds: No stridor. No wheezing, rhonchi or rales.  Abdominal:     General: Abdomen is flat. Bowel sounds are normal. There is no distension.     Palpations: Abdomen is soft. There is no hepatomegaly, splenomegaly or mass.     Tenderness: There is no abdominal tenderness.  Musculoskeletal:        General: Normal range of motion.      Cervical back: Neck supple.     Right lower leg: No edema.     Left lower leg: No edema.  Lymphadenopathy:     Cervical: No cervical adenopathy.  Skin:    General: Skin is warm and dry.     Coloration: Skin is not pale.  Neurological:     General: No focal deficit present.     Mental Status: She is alert and oriented to person, place, and time. Mental status is at baseline.  Psychiatric:        Mood and Affect: Mood normal.        Behavior: Behavior normal.     Lab Results  Component Value Date   WBC 6.1 05/24/2020   HGB 15.8 (H) 05/24/2020   HCT 46.6 (H) 05/24/2020   PLT 246.0 05/24/2020   GLUCOSE 68 (L) 05/24/2020   CHOL 212 (H) 01/13/2020   TRIG 123 01/13/2020   HDL 39 (L) 01/13/2020   LDLCALC 148 (H) 01/13/2020   ALT 12 01/13/2020   AST 15 01/13/2020   NA 139 05/24/2020   K 3.3 (L) 05/24/2020   CL 103 05/24/2020   CREATININE 0.89 05/24/2020   BUN 10 05/24/2020   CO2 30 05/24/2020   TSH 1.66 01/13/2020   INR 1.2 04/25/2019    DG Cervical Spine Complete  Result Date: 01/14/2020 CLINICAL DATA:  Cervicalgia EXAM: CERVICAL SPINE - COMPLETE 4+ VIEW COMPARISON:  None. FINDINGS: Frontal, lateral, open-mouth odontoid, and bilateral oblique views were obtained. There is no fracture or spondylolisthesis. Prevertebral soft tissues and predental space regions are normal. There is moderately severe disc space narrowing at C6-7. There is milder disc space narrowing at C4-5 and C5-6. There are anterior osteophytes at C4, C5, and C6. There are foci of calcification in the anterior ligament at C3-4, C4-5, C5-6, and C6-7. There is facet hypertrophy with exit foraminal narrowing on the right at C3-4, C4-5, C5-6, and C6-7 and on the left at C6-7. Lung apices are clear. IMPRESSION: Osteoarthritic change at several levels, most notably at C6-7. No fracture or spondylolisthesis. Electronically Signed   By: Lowella Grip III M.D.   On: 01/14/2020 15:37    Assessment & Plan:    Caran was seen today for hypertension.  Diagnoses and all orders for this visit:  Essential hypertension, benign- Her blood pressure is not adequately well controlled and she is symptomatic.  Her K+ level is low. She agrees to restart Dyazide.  I will change the beta-blocker to something that can be taken once a day.  . -     triamterene-hydrochlorothiazide (DYAZIDE) 37.5-25 MG capsule; Take 1 each (1 capsule total) by mouth daily. -     nebivolol (BYSTOLIC) 10 MG tablet; Take 1 tablet (10 mg total) by mouth daily. -     CBC with Differential/Platelet; Future -     Basic metabolic panel; Future -  Basic metabolic panel -     CBC with Differential/Platelet  Tobacco abuse disorder- Her H&H are lower than they were before.  She is motivated to quit smoking.  I recommended that she start using Chantix. -     varenicline (CHANTIX CONTINUING MONTH PAK) 1 MG tablet; Take 1 tablet (1 mg total) by mouth 2 (two) times daily. -     varenicline (CHANTIX STARTING MONTH PAK) 0.5 MG X 11 & 1 MG X 42 tablet; Take one 0.5 mg tablet by mouth once daily for 3 days, then increase to one 0.5 mg tablet twice daily for 4 days, then increase to one 1 mg tablet twice daily.  Spondylosis of cervical region without myelopathy or radiculopathy-she has nonradiating neck pain and is neurologically intact.  I recommended she start physical therapy. -     Ambulatory referral to Physical Therapy   I have discontinued Arnitra Oppenheimer's carvedilol and meloxicam. I am also having her start on nebivolol, varenicline, and Chantix Starting Month Pak. Additionally, I am having her maintain her zolpidem, rosuvastatin, and triamterene-hydrochlorothiazide.  Meds ordered this encounter  Medications  . triamterene-hydrochlorothiazide (DYAZIDE) 37.5-25 MG capsule    Sig: Take 1 each (1 capsule total) by mouth daily.    Dispense:  90 capsule    Refill:  0  . nebivolol (BYSTOLIC) 10 MG tablet    Sig: Take 1 tablet (10 mg  total) by mouth daily.    Dispense:  90 tablet    Refill:  0  . varenicline (CHANTIX CONTINUING MONTH PAK) 1 MG tablet    Sig: Take 1 tablet (1 mg total) by mouth 2 (two) times daily.    Dispense:  60 tablet    Refill:  3  . varenicline (CHANTIX STARTING MONTH PAK) 0.5 MG X 11 & 1 MG X 42 tablet    Sig: Take one 0.5 mg tablet by mouth once daily for 3 days, then increase to one 0.5 mg tablet twice daily for 4 days, then increase to one 1 mg tablet twice daily.    Dispense:  53 tablet    Refill:  0     Follow-up: Return in about 6 weeks (around 07/05/2020).  Scarlette Calico, MD

## 2020-05-24 NOTE — Patient Instructions (Signed)

## 2020-05-30 ENCOUNTER — Telehealth: Payer: Self-pay | Admitting: Internal Medicine

## 2020-05-30 ENCOUNTER — Encounter: Payer: Self-pay | Admitting: Internal Medicine

## 2020-05-30 ENCOUNTER — Other Ambulatory Visit: Payer: Self-pay | Admitting: Internal Medicine

## 2020-05-30 DIAGNOSIS — Z720 Tobacco use: Secondary | ICD-10-CM

## 2020-05-30 DIAGNOSIS — F5101 Primary insomnia: Secondary | ICD-10-CM

## 2020-05-30 MED ORDER — CHANTIX STARTING MONTH PAK 0.5 MG X 11 & 1 MG X 42 PO TABS: ORAL_TABLET | ORAL | 0 refills | Status: DC

## 2020-05-30 MED ORDER — VARENICLINE TARTRATE 1 MG PO TABS: 1.0000 mg | ORAL_TABLET | Freq: Two times a day (BID) | ORAL | 3 refills | Status: DC

## 2020-05-30 NOTE — Telephone Encounter (Signed)
varenicline (CHANTIX CONTINUING MONTH PAK) 1 MG tablet varenicline (CHANTIX STARTING MONTH PAK) 0.5 MG X 11 & 1 MG X 42 tablet Patient has spoken with the pharmacy and they have never received these medications.  [id number] [address], [place] Phone:  [phone]  Fax:  [phone]

## 2020-05-31 NOTE — Telephone Encounter (Signed)
Called pt, LVM stating Rx's were sent again.

## 2020-06-13 ENCOUNTER — Other Ambulatory Visit: Payer: Self-pay | Admitting: Internal Medicine

## 2020-06-13 DIAGNOSIS — I10 Essential (primary) hypertension: Secondary | ICD-10-CM

## 2020-06-23 ENCOUNTER — Other Ambulatory Visit: Payer: Self-pay | Admitting: Internal Medicine

## 2020-06-23 DIAGNOSIS — G8929 Other chronic pain: Secondary | ICD-10-CM

## 2020-06-23 DIAGNOSIS — M47812 Spondylosis without myelopathy or radiculopathy, cervical region: Secondary | ICD-10-CM

## 2020-07-06 ENCOUNTER — Ambulatory Visit: Payer: 59 | Admitting: Physical Therapy

## 2020-07-25 ENCOUNTER — Encounter: Payer: Self-pay | Admitting: Physical Therapy

## 2020-07-25 ENCOUNTER — Other Ambulatory Visit: Payer: Self-pay

## 2020-07-25 ENCOUNTER — Ambulatory Visit: Payer: 59 | Attending: Internal Medicine | Admitting: Physical Therapy

## 2020-07-25 DIAGNOSIS — M436 Torticollis: Secondary | ICD-10-CM | POA: Insufficient documentation

## 2020-07-25 DIAGNOSIS — M542 Cervicalgia: Secondary | ICD-10-CM | POA: Insufficient documentation

## 2020-07-25 DIAGNOSIS — R252 Cramp and spasm: Secondary | ICD-10-CM | POA: Insufficient documentation

## 2020-07-25 NOTE — Patient Instructions (Signed)
Access Code: [email or web address]   URL: [email or web address]: 01/24/2022Prepared by: Anderson Malta PaaExercises  Supine Cervical Retraction with Towel - 2 x daily - 7 x weekly - 1 sets - 10 reps - 5 hold  Seated Passive Cervical Retraction - 2 x daily - 7 x weekly - 1 sets - 10 reps - 5 hold  Seated Cervical Rotation AROM - 2 x daily - 7 x weekly - 1 sets - 10 reps - 10 hold  Seated Gentle Upper Trapezius Stretch - 2 x daily - 7 x weekly - 1 sets - 3-5 reps - 30 hold  Gentle Levator Scapulae Stretch - 2 x daily - 7 x weekly - 1 sets - 3-5 reps - 30 hold

## 2020-07-26 NOTE — Therapy (Addendum)
[place], [place], [address] Phone: [phone]   Fax:  [phone]  Physical Therapy Evaluation/Addended Discharge   Patient Details  Name: Samantha Reed MRN: [id number] Date of Birth: June 11, 1971 Referring Provider (PT): Dr. Scarlette Calico   Encounter Date: 07/25/2020   PT End of Session - 07/25/20 1935     Visit Number 1    Number of Visits 12    Date for PT Re-Evaluation 09/19/20    PT Start Time 1619    PT Stop Time 1705    PT Time Calculation (min) 46 min    Activity Tolerance Patient tolerated treatment well    Behavior During Therapy [facility] for tasks assessed/performed             Past Medical History:  Diagnosis Date   Anemia    Fibroid    High cholesterol    Hypertension    Sleep apnea    no cpap mild    Past Surgical History:  Procedure Laterality Date   BREAST SURGERY     LUMPECTOMY FROM BREAST ? WHICH BREAST    HYSTERECTOMY ABDOMINAL WITH SALPINGECTOMY Bilateral 04/13/2019   Procedure: HYSTERECTOMY ABDOMINAL WITH SALPINGECTOMY;  Surgeon: Princess Bruins, MD;  Location: [facility];  Service: Gynecology;  Laterality: Bilateral;  request 7:30am OR Time in [place] block requests 2 hours OR time   IR RADIOLOGIST EVAL & MGMT  05/12/2019   IR SINUS/FIST TUBE CHK-NON GI  04/30/2019   TUBAL LIGATION      There were no vitals filed for this visit.    Subjective Assessment - 07/25/20 1623     Subjective I have had this neck pain for some time. My friends have told me my whole body moves when I have to turn my head.  I have to take meds about every day for awhile and then it comes back.  She denies headaches.  The pain has radiated to arms  Rt arm > Lt. but not consistent.  She has had numbness in her hands but she cannot link it to her neck.  Sleeping is difficult, unable to sleep on her stomach anymore. She feels inconvenienced by this pain and wants to  learn about how to help herself.    Limitations Sitting;House hold activities;Other (comment)   sleeping   Diagnostic tests Osteoarthritic change at several levels, most notably at C6-7. No  fracture or spondylolisthesis.    Patient Stated Goals Learn how to help her pain .    Currently in Pain? Yes    Pain Score 3     Pain Location Neck    Pain Orientation Right;Left    Pain Descriptors / Indicators Tightness    Pain Type Chronic pain    Pain Onset More than a month ago    Pain Frequency Intermittent    Aggravating Factors  sleeping in weird position    Pain Relieving Factors medicine, stretching it, heating pad, massager    Effect of Pain on Daily Activities works from home, makes life uncomfortable    Multiple Pain Sites No                Objective measurements completed on examination: See above findings.        [facility] PT Assessment - 07/26/20 0001       Assessment   Medical Diagnosis cervical spondylosis    Referring Provider (PT) Dr. Scarlette Calico    Onset Date/Surgical Date --  chronic   Hand Dominance Left    Prior Therapy No      Precautions   Precautions None      Restrictions   Weight Bearing Restrictions No      Balance Screen   Has the patient fallen in the past 6 months No   notices a change in balance but no falls   Has the patient had a decrease in activity level because of a fear of falling?  No    Is the patient reluctant to leave their home because of a fear of falling?  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, worse now that she is at home    Leisure friends , travel      Cognition   Overall Cognitive Status Within Functional Limits for tasks assessed      Observation/Other Assessments   Focus on Therapeutic  Outcomes (FOTO)  56%      Sensation   Light Touch Appears Intact      Coordination   Gross Motor Movements are Fluid and Coordinated Not tested      Posture/Postural Control   Posture/Postural Control Postural limitations    Posture Comments high L shoulder, corrects posture well when assessed      AROM   Cervical Flexion 55    Cervical Extension 25    Cervical - Right Side Bend 28    Cervical - Left Side Bend 20    Cervical - Right Rotation 45    Cervical - Left Rotation 65      Strength   Overall Strength Comments WNL      Palpation   Spinal mobility hypomobile in middle, lower cervical spine in extension and lateral glides    Palpation comment increased tension along suboccipitals and multiple symptomatic trigger points throughout bilateral upper traps, scalenes      Special Tests   Other special tests NT      Transfers   Five time sit to stand comments  NT                      PT Education - 07/25/20 1935     Education Details PT/POC, see flowsheet    Person(s) Educated Patient    Methods Explanation;Demonstration;Handout    Comprehension Verbalized understanding;Returned demonstration                 PT Long Term Goals - 07/25/20 1936       PT LONG TERM GOAL #1   Title Pt will be I with HEP for cervical ROM and stability , posture    Time 6    Period Weeks    Status New    Target Date 09/05/20      PT LONG TERM GOAL #2   Title FOTO will improve to 68% or more to demo improved function.    Time 6    Period Weeks    Target Date 09/05/20      PT LONG TERM GOAL #3   Title Pt will be able to improve sleep quality due to less pain and wake periods    Time 6    Period Weeks    Status New  Target Date 09/05/20      PT LONG TERM GOAL #4   Title Pt will be able to show cervical rotation improved by 10 or mroe degrees bilaterally    Time 6    Period Weeks    Status New    Target Date 09/05/20      PT LONG TERM GOAL #5   Title  Pt will take standing breaks while working at least 1 x every hour to reduce neck strain and mental stress    Time 6    Period Weeks    Status New    Target Date 09/05/20                    Plan - 07/25/20 1934     Clinical Impression Statement Patient presents for low complexity eval for neck pain due to multilevel cervical spondylosis, mostly C6-C7. She has no specific weakness, radicular symptoms but does show limited cervical ROM, particularly on Rt side.  She responded well to brief manual work to neck and shoulders, more for assessment than treatment. She will benefit from education regarding her ergonomic setup for work, self care for posture and exercises to improve her flexibility.    Personal Factors and Comorbidities Comorbidity 1;Profession;Past/Current Experience;Time since onset of injury/illness/exacerbation    Comorbidities hypertension    Examination-Activity Limitations Sit;Sleep;Carry;Reach Overhead    Examination-Participation Restrictions Community Activity    Stability/Clinical Decision Making Stable/Uncomplicated    Clinical Decision Making Low    Rehab Potential Excellent    PT Frequency 2x / week    PT Duration 6 weeks    PT Treatment/Interventions ADLs/Self Care Home Management;Cryotherapy;Traction;Patient/family education;Therapeutic exercise;Manual techniques;Moist Heat;Functional mobility training;Dry needling;Passive range of motion;Taping;Therapeutic activities;Spinal Manipulations    PT Next Visit Plan check HEP, manual, spinal mobility    PT Home Exercise Plan [place] and Agree with Plan of Care Patient             Patient will benefit from skilled therapeutic intervention in order to improve the following deficits and impairments:  Decreased mobility,Hypomobility,Decreased range of motion,Increased fascial restricitons,Impaired flexibility,Impaired UE functional use,Pain  Visit Diagnosis: Cervicalgia  Stiffness of cervical  spine  Cramp and spasm     Problem List Patient Active Problem List   Diagnosis Date Noted   Spondylosis of cervical region without myelopathy or radiculopathy 01/17/2020   Chronic neck pain 01/13/2020   Fibroids 04/13/2019   Iron deficiency anemia due to chronic blood loss 09/11/2017   Hypokalemia 09/10/2017   Routine general medical examination at a health care facility 08/12/2017   History of cervical cancer 11/05/2016   Smoker 05/30/2016   Essential hypertension, benign 04/26/2016   Hyperlipidemia LDL goal <130 04/26/2016   Primary insomnia 04/26/2016   Tobacco abuse disorder 04/26/2016    Veria Stradley 07/26/2020, 12:09 PM  [facility] [address] [place], [place], [address] Phone: [phone]   Fax:  [phone]  Name: Endya Austin MRN: [id number] Date of Birth: 1970-08-11   Raeford Razor, PT 07/26/20 12:09 PM Phone: [phone] Fax: [phone]   DID NOT RETURN FOR PT TREATMENT  Raeford Razor, PT 12/12/20 9:50 AM Phone: [phone] Fax: [phone]

## 2020-07-27 ENCOUNTER — Other Ambulatory Visit: Payer: Self-pay | Admitting: Internal Medicine

## 2020-07-27 DIAGNOSIS — I10 Essential (primary) hypertension: Secondary | ICD-10-CM

## 2020-07-28 ENCOUNTER — Other Ambulatory Visit: Payer: Self-pay | Admitting: Internal Medicine

## 2020-07-28 DIAGNOSIS — I10 Essential (primary) hypertension: Secondary | ICD-10-CM

## 2020-07-28 MED ORDER — NEBIVOLOL HCL 10 MG PO TABS: 10.0000 mg | ORAL_TABLET | Freq: Every day | ORAL | 0 refills | Status: DC

## 2020-07-30 ENCOUNTER — Other Ambulatory Visit: Payer: Self-pay | Admitting: Internal Medicine

## 2020-07-30 DIAGNOSIS — I10 Essential (primary) hypertension: Secondary | ICD-10-CM

## 2020-08-01 ENCOUNTER — Ambulatory Visit: Payer: 59

## 2020-08-08 ENCOUNTER — Ambulatory Visit: Payer: 59

## 2020-09-28 ENCOUNTER — Other Ambulatory Visit: Payer: Self-pay | Admitting: Internal Medicine

## 2020-09-28 ENCOUNTER — Encounter: Payer: Self-pay | Admitting: Internal Medicine

## 2020-09-28 DIAGNOSIS — M542 Cervicalgia: Secondary | ICD-10-CM

## 2020-09-28 DIAGNOSIS — G8929 Other chronic pain: Secondary | ICD-10-CM

## 2020-09-28 DIAGNOSIS — M47812 Spondylosis without myelopathy or radiculopathy, cervical region: Secondary | ICD-10-CM

## 2020-09-28 MED ORDER — MELOXICAM 7.5 MG PO TABS
7.5000 mg | ORAL_TABLET | Freq: Every day | ORAL | 1 refills | Status: DC
Start: 2020-09-28 — End: 2020-12-12

## 2020-10-26 ENCOUNTER — Other Ambulatory Visit: Payer: Self-pay | Admitting: Internal Medicine

## 2020-10-26 DIAGNOSIS — I10 Essential (primary) hypertension: Secondary | ICD-10-CM

## 2020-10-27 ENCOUNTER — Encounter: Payer: Self-pay | Admitting: Nurse Practitioner

## 2020-10-31 ENCOUNTER — Other Ambulatory Visit: Payer: Self-pay

## 2020-10-31 ENCOUNTER — Other Ambulatory Visit (HOSPITAL_COMMUNITY)
Admission: RE | Admit: 2020-10-31 | Discharge: 2020-10-31 | Disposition: A | Discharge status: Home / Self Care | Payer: 59 | Source: Ambulatory Visit | Attending: Nurse Practitioner | Admitting: Nurse Practitioner

## 2020-10-31 ENCOUNTER — Encounter: Payer: Self-pay | Admitting: Nurse Practitioner

## 2020-10-31 ENCOUNTER — Ambulatory Visit (INDEPENDENT_AMBULATORY_CARE_PROVIDER_SITE_OTHER): Payer: 59 | Admitting: Nurse Practitioner

## 2020-10-31 VITALS — BP 122/78 | Ht 67.0 in | Wt 160.0 lb

## 2020-10-31 DIAGNOSIS — B9689 Other specified bacterial agents as the cause of diseases classified elsewhere: Secondary | ICD-10-CM

## 2020-10-31 DIAGNOSIS — Z90710 Acquired absence of both cervix and uterus: Secondary | ICD-10-CM

## 2020-10-31 DIAGNOSIS — Z113 Encounter for screening for infections with a predominantly sexual mode of transmission: Secondary | ICD-10-CM

## 2020-10-31 DIAGNOSIS — Z01419 Encounter for gynecological examination (general) (routine) without abnormal findings: Secondary | ICD-10-CM | POA: Diagnosis not present

## 2020-10-31 DIAGNOSIS — R232 Flushing: Secondary | ICD-10-CM

## 2020-10-31 DIAGNOSIS — N760 Acute vaginitis: Secondary | ICD-10-CM | POA: Diagnosis not present

## 2020-10-31 LAB — WET PREP FOR TRICH, YEAST, CLUE

## 2020-10-31 MED ORDER — METRONIDAZOLE 0.75 % VA GEL: 1.0000 | Freq: Every day | VAGINAL | 0 refills | Status: AC

## 2020-10-31 NOTE — Addendum Note (Signed)
Addended by: Nelva Nay on: 10/31/2020 04:14 PM   Modules accepted: Orders

## 2020-10-31 NOTE — Patient Instructions (Signed)
Health Maintenance, Female Adopting a healthy lifestyle and getting preventive care are important in promoting health and wellness. Ask your health care provider about:  The right schedule for you to have regular tests and exams.  Things you can do on your own to prevent diseases and keep yourself healthy. What should I know about diet, weight, and exercise? Eat a healthy diet  Eat a diet that includes plenty of vegetables, fruits, low-fat dairy products, and lean protein.  Do not eat a lot of foods that are high in solid fats, added sugars, or sodium.   Maintain a healthy weight Body mass index (BMI) is used to identify weight problems. It estimates body fat based on height and weight. Your health care provider can help determine your BMI and help you achieve or maintain a healthy weight. Get regular exercise Get regular exercise. This is one of the most important things you can do for your health. Most adults should:  Exercise for at least 150 minutes each week. The exercise should increase your heart rate and make you sweat (moderate-intensity exercise).  Do strengthening exercises at least twice a week. This is in addition to the moderate-intensity exercise.  Spend less time sitting. Even light physical activity can be beneficial. Watch cholesterol and blood lipids Have your blood tested for lipids and cholesterol at 51 years of age, then have this test every 5 years. Have your cholesterol levels checked more often if:  Your lipid or cholesterol levels are high.  You are older than 51 years of age.  You are at high risk for heart disease. What should I know about cancer screening? Depending on your health history and family history, you may need to have cancer screening at various ages. This may include screening for:  Breast cancer.  Cervical cancer.  Colorectal cancer.  Skin cancer.  Lung cancer. What should I know about heart disease, diabetes, and high blood  pressure? Blood pressure and heart disease  High blood pressure causes heart disease and increases the risk of stroke. This is more likely to develop in people who have high blood pressure readings, are of African descent, or are overweight.  Have your blood pressure checked: ? Every 3-5 years if you are 18-39 years of age. ? Every year if you are 40 years old or older. Diabetes Have regular diabetes screenings. This checks your fasting blood sugar level. Have the screening done:  Once every three years after age 40 if you are at a normal weight and have a low risk for diabetes.  More often and at a younger age if you are overweight or have a high risk for diabetes. What should I know about preventing infection? Hepatitis B If you have a higher risk for hepatitis B, you should be screened for this virus. Talk with your health care provider to find out if you are at risk for hepatitis B infection. Hepatitis C Testing is recommended for:  Everyone born from 1945 through 1965.  Anyone with known risk factors for hepatitis C. Sexually transmitted infections (STIs)  Get screened for STIs, including gonorrhea and chlamydia, if: ? You are sexually active and are younger than 51 years of age. ? You are older than 51 years of age and your health care provider tells you that you are at risk for this type of infection. ? Your sexual activity has changed since you were last screened, and you are at increased risk for chlamydia or gonorrhea. Ask your health care provider   if you are at risk.  Ask your health care provider about whether you are at high risk for HIV. Your health care provider may recommend a prescription medicine to help prevent HIV infection. If you choose to take medicine to prevent HIV, you should first get tested for HIV. You should then be tested every 3 months for as long as you are taking the medicine. Pregnancy  If you are about to stop having your period (premenopausal) and  you may become pregnant, seek counseling before you get pregnant.  Take 400 to 800 micrograms (mcg) of folic acid every day if you become pregnant.  Ask for birth control (contraception) if you want to prevent pregnancy. Osteoporosis and menopause Osteoporosis is a disease in which the bones lose minerals and strength with aging. This can result in bone fractures. If you are 65 years old or older, or if you are at risk for osteoporosis and fractures, ask your health care provider if you should:  Be screened for bone loss.  Take a calcium or vitamin D supplement to lower your risk of fractures.  Be given hormone replacement therapy (HRT) to treat symptoms of menopause. Follow these instructions at home: Lifestyle  Do not use any products that contain nicotine or tobacco, such as cigarettes, e-cigarettes, and chewing tobacco. If you need help quitting, ask your health care provider.  Do not use street drugs.  Do not share needles.  Ask your health care provider for help if you need support or information about quitting drugs. Alcohol use  Do not drink alcohol if: ? Your health care provider tells you not to drink. ? You are pregnant, may be pregnant, or are planning to become pregnant.  If you drink alcohol: ? Limit how much you use to 0-1 drink a day. ? Limit intake if you are breastfeeding.  Be aware of how much alcohol is in your drink. In the [place] one drink equals one 12 oz bottle of beer (355 mL), one 5 oz glass of wine (148 mL), or one 1 oz glass of hard liquor (44 mL). General instructions  Schedule regular health, dental, and eye exams.  Stay current with your vaccines.  Tell your health care provider if: ? You often feel depressed. ? You have ever been abused or do not feel safe at home. Summary  Adopting a healthy lifestyle and getting preventive care are important in promoting health and wellness.  Follow your health care provider's instructions about healthy  diet, exercising, and getting tested or screened for diseases.  Follow your health care provider's instructions on monitoring your cholesterol and blood pressure. This information is not intended to replace advice given to you by your health care provider. Make sure you discuss any questions you have with your health care provider. Document Revised: 06/11/2018 Document Reviewed: 06/11/2018 Elsevier Patient Education  2021 Elsevier Inc.  

## 2020-10-31 NOTE — Progress Notes (Signed)
   Samantha Reed Nov 18, 1970 [id number]   History:  50 y.o. [id number] presents for annual exam without GYN complaints. 04/2019 TAH with bilateral salpingectomies for fibroids. Taking OTC Estroven for hot flashes with good relief. 2011 LEEP CIN- 2 and 3, subsequent paps normal. Right breast fibroadenoma 2011. Smoker. HTN. HLD managed by PCP.   Gynecologic History Patient's last menstrual period was 11/03/2018.   Contraception/Family planning: status post hysterectomy  Health Maintenance Last Pap: 10/05/2017. Results were: normal Last mammogram: 02/05/2020. Results were: Previously seen left breast cyst decreased in size Last colonoscopy: 7 years ago per patient Last Dexa: N/A  Past medical history, past surgical history, family history and social history were all reviewed and documented in the EPIC chart. Divorced. 50 and 27 yo children. 20 yo granddaughter.   ROS:  A ROS was performed and pertinent positives and negatives are included.  Exam:  Vitals:   10/31/20 1527  BP: 122/78  Weight: 160 lb (72.6 kg)  Height: 5\' 7"  (1.702 m)   Body mass index is 25.06 kg/m.  General appearance:  Normal Thyroid:  Symmetrical, normal in size, without palpable masses or nodularity. Respiratory  Auscultation:  Clear without wheezing or rhonchi Cardiovascular  Auscultation:  Regular rate, without rubs, murmurs or gallops  Edema/varicosities:  Not grossly evident Abdominal  Soft,nontender, without masses, guarding or rebound.  Liver/spleen:  No organomegaly noted  Hernia:  None appreciated  Skin  Inspection:  Grossly normal Breasts: Examined lying and sitting.   Right: Without masses, retractions, nipple discharge or axillary adenopathy.   Left: Without masses, retractions, nipple discharge or axillary adenopathy. Gentitourinary   Inguinal/mons:  Normal without inguinal adenopathy  External genitalia:  Normal appearing vulva with no masses, tenderness, or lesions  BUS/Urethra/Skene's  glands:  Normal  Vagina:  Copious white discharge. No erythema, no lesions.  Cervix:  Absent  Uterus:  Absent  Adnexa/parametria:     Rt: Normal in size, without masses or tenderness.   Lt: Normal in size, without masses or tenderness.  Anus and perineum: Normal  Digital rectal exam: Normal sphincter tone without palpated masses or tenderness  Assessment/Plan:  50 y.o. [id number] for annual exam.   Well female exam with routine gynecological exam - Plan: Cytology - PAP( [place]). Education provided on SBEs, importance of preventative screenings, current guidelines, high calcium diet, regular exercise, and multivitamin daily. Labs with PCP.   History of LAVH - 04/2019 for fibroids  Bacterial vaginosis - Plan: metroNIDAZOLE (METROGEL) 0.75 % vaginal gel nightly x 5 nights.  Hot flashes - Taking Estroven with good relief of hot flashes. If she misses a dose she has multiple hot flashes daily. We discussed avoiding estrogen due to smoking status at this time, so she will continue OTC supplement.   Screen for STD (sexually transmitted disease) - Plan: Cytology - PAP( [place]) with GC/chlamydia.   Screening for cervical cancer - 2011 LEEP CIN- 2 and 3, subsequent paps normal. Pap with reflex today.   Screening for breast cancer - 2011 fibroadenoma, otherwise normal mammogram history.  Continue annual screenings.  Normal breast exam today.  Screening for colon cancer - Colonoscopy about 7 years ago per patient. Will repeat at GI's recommended interval.   Return in 1 year for annual.    Tamela Gammon DNP, 3:45 PM 10/31/2020

## 2020-11-03 LAB — CYTOLOGY - PAP
Chlamydia: NEGATIVE
Comment: NEGATIVE
Comment: NEGATIVE
Comment: NORMAL
Diagnosis: NEGATIVE
High risk HPV: NEGATIVE
Neisseria Gonorrhea: NEGATIVE

## 2020-11-14 ENCOUNTER — Encounter: Payer: Self-pay | Admitting: Nurse Practitioner

## 2020-11-21 ENCOUNTER — Other Ambulatory Visit: Payer: Self-pay | Admitting: Internal Medicine

## 2020-11-21 DIAGNOSIS — E785 Hyperlipidemia, unspecified: Secondary | ICD-10-CM

## 2020-11-22 ENCOUNTER — Telehealth: Payer: Self-pay | Admitting: Internal Medicine

## 2020-11-22 NOTE — Telephone Encounter (Signed)
   Pharmacy calling to request order for zolpidem (AMBIEN) 10 MG tablet  Advised pharmacy patient needs appointment

## 2020-11-22 NOTE — Telephone Encounter (Signed)
Noted  

## 2020-11-24 ENCOUNTER — Encounter: Payer: Self-pay | Admitting: Internal Medicine

## 2020-12-09 ENCOUNTER — Other Ambulatory Visit: Payer: Self-pay

## 2020-12-12 ENCOUNTER — Other Ambulatory Visit: Payer: Self-pay

## 2020-12-12 ENCOUNTER — Encounter: Payer: Self-pay | Admitting: Internal Medicine

## 2020-12-12 ENCOUNTER — Ambulatory Visit: Payer: 59 | Admitting: Internal Medicine

## 2020-12-12 VITALS — BP 138/84 | HR 66 | Temp 98.5°F | Resp 16 | Ht 67.0 in | Wt 160.0 lb

## 2020-12-12 DIAGNOSIS — R002 Palpitations: Secondary | ICD-10-CM | POA: Diagnosis not present

## 2020-12-12 DIAGNOSIS — M542 Cervicalgia: Secondary | ICD-10-CM | POA: Diagnosis not present

## 2020-12-12 DIAGNOSIS — M47812 Spondylosis without myelopathy or radiculopathy, cervical region: Secondary | ICD-10-CM | POA: Diagnosis not present

## 2020-12-12 DIAGNOSIS — F5101 Primary insomnia: Secondary | ICD-10-CM

## 2020-12-12 DIAGNOSIS — E785 Hyperlipidemia, unspecified: Secondary | ICD-10-CM

## 2020-12-12 DIAGNOSIS — E876 Hypokalemia: Secondary | ICD-10-CM

## 2020-12-12 DIAGNOSIS — I10 Essential (primary) hypertension: Secondary | ICD-10-CM | POA: Diagnosis not present

## 2020-12-12 DIAGNOSIS — D500 Iron deficiency anemia secondary to blood loss (chronic): Secondary | ICD-10-CM | POA: Diagnosis not present

## 2020-12-12 DIAGNOSIS — G8929 Other chronic pain: Secondary | ICD-10-CM

## 2020-12-12 LAB — CBC WITH DIFFERENTIAL/PLATELET
Basophils Absolute: 0 10*3/uL (ref 0.0–0.1)
Basophils Relative: 0.8 % (ref 0.0–3.0)
Eosinophils Absolute: 0.2 10*3/uL (ref 0.0–0.7)
Eosinophils Relative: 3.4 % (ref 0.0–5.0)
HCT: 48 % — ABNORMAL HIGH (ref 36.0–46.0)
Hemoglobin: 16.2 g/dL — ABNORMAL HIGH (ref 12.0–15.0)
Lymphocytes Relative: 56.2 % — ABNORMAL HIGH (ref 12.0–46.0)
Lymphs Abs: 2.8 10*3/uL (ref 0.7–4.0)
MCHC: 33.7 g/dL (ref 30.0–36.0)
MCV: 97.1 fl (ref 78.0–100.0)
Monocytes Absolute: 0.4 10*3/uL (ref 0.1–1.0)
Monocytes Relative: 7.8 % (ref 3.0–12.0)
Neutro Abs: 1.6 10*3/uL (ref 1.4–7.7)
Neutrophils Relative %: 31.8 % — ABNORMAL LOW (ref 43.0–77.0)
Platelets: 276 10*3/uL (ref 150.0–400.0)
RBC: 4.94 Mil/uL (ref 3.87–5.11)
RDW: 14 % (ref 11.5–15.5)
WBC: 5 10*3/uL (ref 4.0–10.5)

## 2020-12-12 LAB — HEPATIC FUNCTION PANEL
ALT: 12 U/L (ref 0–35)
AST: 17 U/L (ref 0–37)
Albumin: 4.3 g/dL (ref 3.5–5.2)
Alkaline Phosphatase: 77 U/L (ref 39–117)
Bilirubin, Direct: 0.1 mg/dL (ref 0.0–0.3)
Total Bilirubin: 0.5 mg/dL (ref 0.2–1.2)
Total Protein: 7.6 g/dL (ref 6.0–8.3)

## 2020-12-12 LAB — LIPID PANEL
Cholesterol: 179 mg/dL (ref 0–200)
HDL: 44.7 mg/dL (ref >39.00)
LDL Cholesterol: 118 mg/dL — ABNORMAL HIGH (ref 0–99)
NonHDL: 133.9
Total CHOL/HDL Ratio: 4
Triglycerides: 80 mg/dL (ref 0.0–149.0)
VLDL: 16 mg/dL (ref 0.0–40.0)

## 2020-12-12 LAB — BASIC METABOLIC PANEL
BUN: 12 mg/dL (ref 6–23)
CO2: 28 mEq/L (ref 19–32)
Calcium: 9.5 mg/dL (ref 8.4–10.5)
Chloride: 106 mEq/L (ref 96–112)
Creatinine, Ser: 0.91 mg/dL (ref 0.40–1.20)
GFR: 73.19 mL/min (ref >60.00)
Glucose, Bld: 86 mg/dL (ref 70–99)
Potassium: 3.8 mEq/L (ref 3.5–5.1)
Sodium: 142 mEq/L (ref 135–145)

## 2020-12-12 LAB — MAGNESIUM: Magnesium: 2.1 mg/dL (ref 1.5–2.5)

## 2020-12-12 MED ORDER — ZOLPIDEM TARTRATE 10 MG PO TABS: ORAL_TABLET | ORAL | 2 refills | Status: DC

## 2020-12-12 MED ORDER — TRIAMTERENE-HCTZ 37.5-25 MG PO CAPS: 1.0000 | ORAL_CAPSULE | Freq: Every day | ORAL | 2 refills | Status: DC

## 2020-12-12 MED ORDER — NEBIVOLOL HCL 10 MG PO TABS: 10.0000 mg | ORAL_TABLET | Freq: Every day | ORAL | 1 refills | Status: DC

## 2020-12-12 MED ORDER — ROSUVASTATIN CALCIUM 10 MG PO TABS: 10.0000 mg | ORAL_TABLET | Freq: Every day | ORAL | 5 refills | Status: DC

## 2020-12-12 MED ORDER — MELOXICAM 7.5 MG PO TABS: 7.5000 mg | ORAL_TABLET | Freq: Every day | ORAL | 1 refills | Status: DC

## 2020-12-12 NOTE — Patient Instructions (Signed)
Palpitations Palpitations are feelings that your heartbeat is irregular or is faster than normal. It may feel like your heart is fluttering or skipping a beat. Palpitations are usually not a serious problem. They may be caused by many things, including smoking, caffeine, alcohol, stress, and certain medicines or drugs. Most causes of palpitations are not serious. However, some palpitations can be a sign of a serious problem. You may need further tests to rule outserious medical problems. Follow these instructions at home:     Pay attention to any changes in your condition. Take these actions to helpmanage your symptoms: Eating and drinking Avoid foods and drinks that may cause palpitations. These may include: Caffeinated coffee, tea, soft drinks, diet pills, and energy drinks. Chocolate. Alcohol. Lifestyle Take steps to reduce your stress and anxiety. Things that can help you relax include: Yoga. Mind-body activities, such as deep breathing, meditation, or using words and images to create positive thoughts (guided imagery). Physical activity, such as swimming, jogging, or walking. Tell your health care provider if your palpitations increase with activity. If you have chest pain or shortness of breath with activity, do not continue the activity until you are seen by your health care provider. Biofeedback. This is a method that helps you learn to use your mind to control things in your body, such as your heartbeat. Do not use drugs, including cocaine or ecstasy. Do not use marijuana. Get plenty of rest and sleep. Keep a regular bed time. General instructions Take over-the-counter and prescription medicines only as told by your health care provider. Do not use any products that contain nicotine or tobacco, such as cigarettes and e-cigarettes. If you need help quitting, ask your health care provider. Keep all follow-up visits as told by your health care provider. This is important. These may  include visits for further testing if palpitations do not go away or get worse. Contact a health care provider if you: Continue to have a fast or irregular heartbeat after 24 hours. Notice that your palpitations occur more often. Get help right away if you: Have chest pain or shortness of breath. Have a severe headache. Feel dizzy or you faint. Summary Palpitations are feelings that your heartbeat is irregular or is faster than normal. It may feel like your heart is fluttering or skipping a beat. Palpitations may be caused by many things, including smoking, caffeine, alcohol, stress, certain medicines, and drugs. Although most causes of palpitations are not serious, some causes can be a sign of a serious medical problem. Get help right away if you faint or have chest pain, shortness of breath, a severe headache, or dizziness. This information is not intended to replace advice given to you by your health care provider. Make sure you discuss any questions you have with your healthcare provider. Document Revised: 07/31/2017 Document Reviewed: 07/31/2017 Elsevier Patient Education  2022 [employer].

## 2020-12-12 NOTE — Progress Notes (Signed)
Subjective:  Patient ID: Samantha Reed, female    DOB: Jun 23, 1971  Age: 50 y.o. MRN: [id number]  CC: Hypertension and Hyperlipidemia  This visit occurred during the SARS-CoV-2 public health emergency.  Safety protocols were in place, including screening questions prior to the visit, additional usage of staff PPE, and extensive cleaning of exam room while observing appropriate contact time as indicated for disinfecting solutions.    HPI Samantha Reed presents for f/up -   She has chronic, nonradiating neck pain and gets adequate symptom relief with an NSAID.  She denies paresthesias.  She tells me her blood pressure has been well controlled.  She is active and denies any recent episodes of dizziness, lightheadedness, chest pain, shortness of breath, diaphoresis, edema, or fatigue.  She complains of a several month history of palpitations.  She intermittently feels like her heart is racing.  She denies presyncope or syncope.  She tells me that the aggravating factors are caffeine, stress, and tobacco abuse.  Outpatient Medications Prior to Visit  Medication Sig Dispense Refill   Rhubarb (ESTROVEN COMPLETE PO) Take by mouth.     meloxicam (MOBIC) 7.5 MG tablet Take 1 tablet (7.5 mg total) by mouth daily. 90 tablet 1   nebivolol (BYSTOLIC) 10 MG tablet TAKE 1 TABLET BY MOUTH  DAILY 90 tablet 1   rosuvastatin (CRESTOR) 10 MG tablet TAKE 1 TABLET BY MOUTH EVERY DAY 30 tablet 5   triamterene-hydrochlorothiazide (DYAZIDE) 37.5-25 MG capsule TAKE 1 EACH (1 CAPSULE TOTAL) BY MOUTH DAILY. 30 capsule 2   zolpidem (AMBIEN) 10 MG tablet TAKE 1 TABLET BY MOUTH AT BEDTIME AS NEEDED FOR SLEEP 30 tablet 2   No facility-administered medications prior to visit.    ROS Review of Systems  Constitutional:  Negative for diaphoresis and fatigue.  HENT: Negative.    Eyes: Negative.   Respiratory:  Negative for cough, chest tightness, shortness of breath and wheezing.   Cardiovascular:  Negative for  chest pain, palpitations and leg swelling.  Gastrointestinal:  Negative for abdominal pain, constipation, diarrhea, nausea and vomiting.  Genitourinary: Negative.  Negative for difficulty urinating and hematuria.  Musculoskeletal:  Positive for neck pain. Negative for arthralgias and myalgias.  Neurological: Negative.  Negative for dizziness, weakness, light-headedness and numbness.  Hematological:  Negative for adenopathy. Does not bruise/bleed easily.  Psychiatric/Behavioral:  Positive for sleep disturbance. Negative for decreased concentration and dysphoric mood. The patient is not nervous/anxious.    Objective:  BP 138/84 (BP Location: Left Arm, Patient Position: Sitting, Cuff Size: Large)   Pulse 66   Temp 98.5 F (36.9 C) (Rectal)   Resp 16   Ht 5\' 7"  (1.702 m)   Wt 160 lb (72.6 kg)   LMP 11/03/2018   SpO2 98%   BMI 25.06 kg/m   BP Readings from Last 3 Encounters:  12/12/20 138/84  10/31/20 122/78  05/24/20 (!) 178/90    Wt Readings from Last 3 Encounters:  12/12/20 160 lb (72.6 kg)  10/31/20 160 lb (72.6 kg)  05/24/20 153 lb (69.4 kg)    Physical Exam Vitals reviewed.  HENT:     Nose: Nose normal.     Mouth/Throat:     Mouth: Mucous membranes are moist.  Eyes:     General: No scleral icterus.    Conjunctiva/sclera: Conjunctivae normal.  Cardiovascular:     Rate and Rhythm: Regular rhythm. Bradycardia present.     Heart sounds: No murmur heard.    Comments: EKG- Sinus bradycardia, 58 bpm No LVH  Otherwise normal EKG  Pulmonary:     Effort: Pulmonary effort is normal.     Breath sounds: No stridor. No wheezing, rhonchi or rales.  Abdominal:     General: Abdomen is flat. Bowel sounds are normal. There is no distension.     Palpations: Abdomen is soft. There is no hepatomegaly, splenomegaly or mass.     Tenderness: There is no abdominal tenderness.  Musculoskeletal:        General: Normal range of motion.     Cervical back: Neck supple.     Right lower  leg: No edema.     Left lower leg: No edema.  Lymphadenopathy:     Cervical: No cervical adenopathy.  Skin:    General: Skin is warm and dry.  Neurological:     General: No focal deficit present.  Psychiatric:        Mood and Affect: Mood normal.    Lab Results  Component Value Date   WBC 5.0 12/12/2020   HGB 16.2 (H) 12/12/2020   HCT 48.0 (H) 12/12/2020   PLT 276.0 12/12/2020   GLUCOSE 86 12/12/2020   CHOL 179 12/12/2020   TRIG 80.0 12/12/2020   HDL 44.70 12/12/2020   LDLCALC 118 (H) 12/12/2020   ALT 12 12/12/2020   AST 17 12/12/2020   NA 142 12/12/2020   K 3.8 12/12/2020   CL 106 12/12/2020   CREATININE 0.91 12/12/2020   BUN 12 12/12/2020   CO2 28 12/12/2020   TSH 0.86 12/12/2020   INR 1.2 04/25/2019    No results found.  Assessment & Plan:   Samantha Reed was seen today for hypertension and hyperlipidemia.  Diagnoses and all orders for this visit:  Hypokalemia- Her potassium level is normal now.  Will continue the potassium sparing diuretic. -     Basic metabolic panel; Future -     Magnesium; Future -     Magnesium -     Basic metabolic panel  Chronic neck pain  Spondylosis of cervical region without myelopathy or radiculopathy -     meloxicam (MOBIC) 7.5 MG tablet; Take 1 tablet (7.5 mg total) by mouth daily.  Essential hypertension, benign- Her blood pressure is adequately well controlled. -     nebivolol (BYSTOLIC) 10 MG tablet; Take 1 tablet (10 mg total) by mouth daily. -     triamterene-hydrochlorothiazide (DYAZIDE) 37.5-25 MG capsule; Take 1 each (1 capsule total) by mouth daily. -     Basic metabolic panel; Future -     Cancel: TSH; Future -     CBC with Differential/Platelet; Future -     Magnesium; Future -     Thyroid Panel With TSH; Future -     Thyroid Panel With TSH -     Magnesium -     CBC with Differential/Platelet -     Basic metabolic panel  Hyperlipidemia LDL goal <130- LDL goal achieved. Doing well on the statin  -      rosuvastatin (CRESTOR) 10 MG tablet; Take 1 tablet (10 mg total) by mouth daily. -     Lipid panel; Future -     Cancel: TSH; Future -     Hepatic function panel; Future -     Thyroid Panel With TSH; Future -     Thyroid Panel With TSH -     Hepatic function panel -     Lipid panel  Primary insomnia -     zolpidem (AMBIEN) 10 MG tablet; TAKE 1  TABLET BY MOUTH AT BEDTIME AS NEEDED FOR SLEEP  Iron deficiency anemia due to chronic blood loss- She is no longer anemic.  In fact, her H&H are elevated.  I recommended that she quit smoking. -     CBC with Differential/Platelet; Future -     CBC with Differential/Platelet  Palpitations- Her EKG today shows sinus bradycardia.  Labs are negative for secondary causes.  I recommended that she wear an event monitor to see if she has a dysrhythmia. -     EKG 12-Lead -     Thyroid Panel With TSH; Future -     CARDIAC EVENT MONITOR; Future -     Thyroid Panel With TSH  I have changed Samantha Reed's nebivolol and rosuvastatin. I am also having her maintain her Rhubarb (ESTROVEN COMPLETE PO), meloxicam, triamterene-hydrochlorothiazide, and zolpidem.  Meds ordered this encounter  Medications   meloxicam (MOBIC) 7.5 MG tablet    Sig: Take 1 tablet (7.5 mg total) by mouth daily.    Dispense:  90 tablet    Refill:  1   nebivolol (BYSTOLIC) 10 MG tablet    Sig: Take 1 tablet (10 mg total) by mouth daily.    Dispense:  90 tablet    Refill:  1   rosuvastatin (CRESTOR) 10 MG tablet    Sig: Take 1 tablet (10 mg total) by mouth daily.    Dispense:  30 tablet    Refill:  5   triamterene-hydrochlorothiazide (DYAZIDE) 37.5-25 MG capsule    Sig: Take 1 each (1 capsule total) by mouth daily.    Dispense:  30 capsule    Refill:  2    DX Code Needed  .   zolpidem (AMBIEN) 10 MG tablet    Sig: TAKE 1 TABLET BY MOUTH AT BEDTIME AS NEEDED FOR SLEEP    Dispense:  30 tablet    Refill:  2    Not to exceed 5 additional fills before 07/11/2020      Follow-up: Return in about 3 months (around 03/14/2021).  Samantha Calico, MD

## 2020-12-13 LAB — THYROID PANEL WITH TSH
Free Thyroxine Index: 1.5 (ref 1.4–3.8)
T3 Uptake: 37 % — ABNORMAL HIGH (ref 22–35)
T4, Total: 4 ug/dL — ABNORMAL LOW (ref 5.1–11.9)
TSH: 0.86 mIU/L

## 2020-12-19 ENCOUNTER — Ambulatory Visit (INDEPENDENT_AMBULATORY_CARE_PROVIDER_SITE_OTHER): Payer: 59

## 2020-12-19 DIAGNOSIS — R002 Palpitations: Secondary | ICD-10-CM | POA: Diagnosis not present

## 2021-01-31 ENCOUNTER — Encounter: Payer: Self-pay | Admitting: Nurse Practitioner

## 2021-02-07 ENCOUNTER — Ambulatory Visit: Payer: 59 | Admitting: Nurse Practitioner

## 2021-03-22 ENCOUNTER — Encounter: Payer: Self-pay | Admitting: Internal Medicine

## 2021-03-22 LAB — HM MAMMOGRAPHY

## 2021-04-07 ENCOUNTER — Encounter: Payer: Self-pay | Admitting: Nurse Practitioner

## 2021-04-14 ENCOUNTER — Other Ambulatory Visit: Payer: Self-pay | Admitting: Internal Medicine

## 2021-04-14 DIAGNOSIS — I10 Essential (primary) hypertension: Secondary | ICD-10-CM

## 2021-05-08 ENCOUNTER — Other Ambulatory Visit: Payer: Self-pay | Admitting: Internal Medicine

## 2021-05-08 DIAGNOSIS — F5101 Primary insomnia: Secondary | ICD-10-CM

## 2021-06-11 ENCOUNTER — Other Ambulatory Visit: Payer: Self-pay | Admitting: Internal Medicine

## 2021-06-20 ENCOUNTER — Other Ambulatory Visit: Payer: Self-pay | Admitting: Internal Medicine

## 2021-07-05 ENCOUNTER — Other Ambulatory Visit: Payer: Self-pay | Admitting: Internal Medicine

## 2021-08-08 ENCOUNTER — Telehealth: Payer: Self-pay | Admitting: Internal Medicine

## 2021-08-08 DIAGNOSIS — I10 Essential (primary) hypertension: Secondary | ICD-10-CM

## 2021-09-21 ENCOUNTER — Other Ambulatory Visit: Payer: Self-pay | Admitting: Internal Medicine

## 2021-09-21 DIAGNOSIS — I10 Essential (primary) hypertension: Secondary | ICD-10-CM

## 2021-09-21 MED ORDER — TRIAMTERENE-HCTZ 37.5-25 MG PO CAPS: 1.0000 | ORAL_CAPSULE | Freq: Every day | ORAL | 0 refills | Status: DC

## 2021-09-21 NOTE — Telephone Encounter (Signed)
Pt requesting a short supply until her next appt 10-17-2021 ? ?Pt states her feet and ankles are swollen  ? ? ?

## 2021-09-28 ENCOUNTER — Encounter: Payer: Self-pay | Admitting: Nurse Practitioner

## 2021-09-28 ENCOUNTER — Ambulatory Visit: Payer: 59 | Admitting: Nurse Practitioner

## 2021-09-28 VITALS — BP 140/86

## 2021-09-28 DIAGNOSIS — N760 Acute vaginitis: Secondary | ICD-10-CM | POA: Diagnosis not present

## 2021-09-28 DIAGNOSIS — Z113 Encounter for screening for infections with a predominantly sexual mode of transmission: Secondary | ICD-10-CM

## 2021-09-28 DIAGNOSIS — N898 Other specified noninflammatory disorders of vagina: Secondary | ICD-10-CM

## 2021-09-28 DIAGNOSIS — B9689 Other specified bacterial agents as the cause of diseases classified elsewhere: Secondary | ICD-10-CM | POA: Diagnosis not present

## 2021-09-28 LAB — WET PREP FOR TRICH, YEAST, CLUE

## 2021-09-28 MED ORDER — METRONIDAZOLE 0.75 % VA GEL: 1.0000 | VAGINAL | 0 refills | Status: DC

## 2021-09-28 MED ORDER — METRONIDAZOLE 0.75 % VA GEL: 1.0000 | Freq: Every day | VAGINAL | 0 refills | Status: AC

## 2021-09-28 NOTE — Telephone Encounter (Signed)
Appointments please reach to schedule office visit.  ?

## 2021-09-28 NOTE — Progress Notes (Signed)
? ?  Acute Office Visit ? ?Subjective:  ? ? Patient ID: Samantha Reed, female    DOB: 20-Mar-1971, 51 y.o.   MRN: [id number] ? ? ?HPI ?51 y.o. presents today for vaginal discharge. Discharge is thick and white. She had BV May 2022 and felt symptoms improved with Metrogel but then returned and seems to always be around. Denies itching. Has not been sexually active in 2.5 months. Had leftover Metrogel at home and used 2 days ago.  ? ? ?Review of Systems  ?Constitutional: Negative.   ?Genitourinary:  Positive for vaginal discharge.  ? ?   ?Objective:  ?  ?Physical Exam ?Constitutional:   ?   Appearance: Normal appearance.  ?Genitourinary: ?   General: Normal vulva.  ?   Vagina: Vaginal discharge present. No erythema.  ? ? ?BP 140/86   LMP 11/03/2018  ?Wt Readings from Last 3 Encounters:  ?12/12/20 160 lb (72.6 kg)  ?10/31/20 160 lb (72.6 kg)  ?05/24/20 153 lb (69.4 kg)  ? ? ?   ? ?Patient informed chaperone available to be present for breast and pelvic exam. Patient has requested no chaperone to be present. Patient has been advised what will be completed during breast and pelvic exam.  ? ?Wet prep + clue cells (+ odor) ? ?Assessment & Plan:  ? ?Problem List Items Addressed This Visit   ?None ?Visit Diagnoses   ? ? Bacterial vaginosis    -  Primary  ? Relevant Medications  ? metroNIDAZOLE (METROGEL) 0.75 % vaginal gel  ? metroNIDAZOLE (METROGEL) 0.75 % vaginal gel  ? Vaginal discharge      ? Relevant Orders  ? WET PREP FOR [place], YEAST, CLUE  ? Screen for STD (sexually transmitted disease)      ? Relevant Orders  ? RPR  ? HIV Antibody (routine testing w rflx)  ? C. trachomatis/N. gonorrhoeae RNA  ? ?  ? ?Plan: Wet prep positive for clue cells - Metrogel 0.75% nightly x 5 nights. After completing initial treatment she will begin suppressive treatment with Metrogel 0.75% twice weekly. STD panel pending.  ? ? ? ? ?Tamela Gammon DNP, 2:57 PM 09/28/2021 ? ?

## 2021-09-29 LAB — C. TRACHOMATIS/N. GONORRHOEAE RNA
C. trachomatis RNA, TMA: NOT DETECTED
N. gonorrhoeae RNA, TMA: NOT DETECTED

## 2021-09-29 LAB — RPR: RPR Ser Ql: NONREACTIVE

## 2021-09-29 LAB — HIV ANTIBODY (ROUTINE TESTING W REFLEX): HIV 1&2 Ab, 4th Generation: NONREACTIVE

## 2021-10-17 ENCOUNTER — Other Ambulatory Visit: Payer: Self-pay | Admitting: Internal Medicine

## 2021-10-17 ENCOUNTER — Ambulatory Visit: Payer: 59 | Admitting: Internal Medicine

## 2021-10-17 ENCOUNTER — Encounter: Payer: Self-pay | Admitting: Internal Medicine

## 2021-10-17 VITALS — BP 132/78 | HR 64 | Temp 98.0°F | Resp 16 | Ht 67.0 in | Wt 162.0 lb

## 2021-10-17 DIAGNOSIS — M47812 Spondylosis without myelopathy or radiculopathy, cervical region: Secondary | ICD-10-CM

## 2021-10-17 DIAGNOSIS — I10 Essential (primary) hypertension: Secondary | ICD-10-CM

## 2021-10-17 DIAGNOSIS — Z720 Tobacco use: Secondary | ICD-10-CM | POA: Diagnosis not present

## 2021-10-17 DIAGNOSIS — N1831 Chronic kidney disease, stage 3a: Secondary | ICD-10-CM

## 2021-10-17 DIAGNOSIS — E785 Hyperlipidemia, unspecified: Secondary | ICD-10-CM | POA: Diagnosis not present

## 2021-10-17 DIAGNOSIS — R413 Other amnesia: Secondary | ICD-10-CM

## 2021-10-17 DIAGNOSIS — F5101 Primary insomnia: Secondary | ICD-10-CM

## 2021-10-17 LAB — CBC WITH DIFFERENTIAL/PLATELET
Basophils Absolute: 0 10*3/uL (ref 0.0–0.1)
Basophils Relative: 0.4 % (ref 0.0–3.0)
Eosinophils Absolute: 0.1 10*3/uL (ref 0.0–0.7)
Eosinophils Relative: 1.7 % (ref 0.0–5.0)
HCT: 41.2 % (ref 36.0–46.0)
Hemoglobin: 13.8 g/dL (ref 12.0–15.0)
Lymphocytes Relative: 46.9 % — ABNORMAL HIGH (ref 12.0–46.0)
Lymphs Abs: 3.1 10*3/uL (ref 0.7–4.0)
MCHC: 33.4 g/dL (ref 30.0–36.0)
MCV: 98.2 fl (ref 78.0–100.0)
Monocytes Absolute: 0.5 10*3/uL (ref 0.1–1.0)
Monocytes Relative: 7.5 % (ref 3.0–12.0)
Neutro Abs: 2.9 10*3/uL (ref 1.4–7.7)
Neutrophils Relative %: 43.5 % (ref 43.0–77.0)
Platelets: 245 10*3/uL (ref 150.0–400.0)
RBC: 4.2 Mil/uL (ref 3.87–5.11)
RDW: 14.4 % (ref 11.5–15.5)
WBC: 6.6 10*3/uL (ref 4.0–10.5)

## 2021-10-17 LAB — BASIC METABOLIC PANEL
BUN: 19 mg/dL (ref 6–23)
CO2: 30 mEq/L (ref 19–32)
Calcium: 10 mg/dL (ref 8.4–10.5)
Chloride: 100 mEq/L (ref 96–112)
Creatinine, Ser: 1.13 mg/dL (ref 0.40–1.20)
GFR: 56.11 mL/min — ABNORMAL LOW (ref >60.00)
Glucose, Bld: 88 mg/dL (ref 70–99)
Potassium: 4.1 mEq/L (ref 3.5–5.1)
Sodium: 137 mEq/L (ref 135–145)

## 2021-10-17 MED ORDER — BELSOMRA 20 MG PO TABS: 20.0000 mg | ORAL_TABLET | Freq: Every evening | ORAL | 1 refills | Status: DC | PRN

## 2021-10-17 MED ORDER — ROSUVASTATIN CALCIUM 10 MG PO TABS: 10.0000 mg | ORAL_TABLET | Freq: Every day | ORAL | 1 refills | Status: DC

## 2021-10-17 MED ORDER — MELOXICAM 7.5 MG PO TABS: 7.5000 mg | ORAL_TABLET | Freq: Every day | ORAL | 1 refills | Status: DC

## 2021-10-17 MED ORDER — VARENICLINE TARTRATE 1 MG PO TABS: 1.0000 mg | ORAL_TABLET | Freq: Two times a day (BID) | ORAL | 3 refills | Status: DC

## 2021-10-17 MED ORDER — CHANTIX STARTING MONTH PAK 0.5 MG X 11 & 1 MG X 42 PO TBPK: ORAL_TABLET | ORAL | 1 refills | Status: DC

## 2021-10-17 MED ORDER — TRIAMTERENE-HCTZ 37.5-25 MG PO CAPS: 1.0000 | ORAL_CAPSULE | Freq: Every day | ORAL | 1 refills | Status: DC

## 2021-10-17 NOTE — Progress Notes (Signed)
? ?Subjective:  ?Patient ID: Samantha Reed, female    DOB: 06-27-1971  Age: 51 y.o. MRN: [id number] ? ?CC: Hypertension and Hyperlipidemia ? ? ?HPI ?Samantha Reed presents for f/up -  ? ?She continues to complain of difficulty falling asleep did not despite drinking wine, taking a puff of marijuana, and taking Ambien.  She says the insomnia causes next-day fatigue.  She denies anxiety or depression.  She is active and denies headache, blurred vision, chest pain, shortness of breath, or edema.  She complains that her memory is declining.  She was not willing to get vaccinated against shingles or influenza today. ? ?Outpatient Medications Prior to Visit  ?Medication Sig Dispense Refill  ? metroNIDAZOLE (METROGEL) 0.75 % vaginal gel Place 1 Applicatorful vaginally 2 (two) times a week. Begin 1 week after completing initial treatment 240 g 0  ? meloxicam (MOBIC) 7.5 MG tablet Take 1 tablet (7.5 mg total) by mouth daily. 90 tablet 1  ? Rhubarb (ESTROVEN COMPLETE PO) Take by mouth.    ? rosuvastatin (CRESTOR) 10 MG tablet Take 1 tablet (10 mg total) by mouth daily. 30 tablet 5  ? triamterene-hydrochlorothiazide (DYAZIDE) 37.5-25 MG capsule Take 1 each (1 capsule total) by mouth daily. 30 capsule 0  ? varenicline (CHANTIX) 1 MG tablet TAKE 1 TABLET BY MOUTH TWICE A DAY 30 tablet 3  ? zolpidem (AMBIEN) 10 MG tablet TAKE 1 TABLET BY MOUTH EVERY DAY AT BEDTIME AS NEEDED FOR SLEEP 30 tablet 1  ? nebivolol (BYSTOLIC) 10 MG tablet Take 1 tablet (10 mg total) by mouth daily. (Patient not taking: Reported on 09/28/2021) 90 tablet 1  ? ?No facility-administered medications prior to visit.  ? ? ?ROS ?Review of Systems  ?Constitutional:  Positive for fatigue. Negative for appetite change, diaphoresis and unexpected weight change.  ?HENT: Negative.    ?Eyes: Negative.   ?Respiratory:  Negative for cough, chest tightness, shortness of breath and wheezing.   ?Cardiovascular:  Negative for chest pain, palpitations and leg  swelling.  ?Gastrointestinal:  Negative for abdominal pain, blood in stool, constipation, diarrhea, nausea and vomiting.  ?Endocrine: Negative.   ?Genitourinary: Negative.  Negative for difficulty urinating.  ?Musculoskeletal:  Positive for neck pain. Negative for arthralgias and back pain.  ?Skin: Negative.   ?Neurological: Negative.  Negative for dizziness, weakness, light-headedness and numbness.  ?Hematological:  Negative for adenopathy. Does not bruise/bleed easily.  ?Psychiatric/Behavioral:  Positive for decreased concentration and sleep disturbance. Negative for confusion, dysphoric mood and suicidal ideas. The patient is not nervous/anxious.   ? ?Objective:  ?BP 132/78 (BP Location: Left Arm, Patient Position: Sitting, Cuff Size: Large)   Pulse 64   Temp 98 ?F (36.7 ?C) (Oral)   Resp 16   Ht '5\' 7"'$  (1.702 m)   Wt 162 lb (73.5 kg)   LMP 11/03/2018   SpO2 97%   BMI 25.37 kg/m?  ? ?BP Readings from Last 3 Encounters:  ?10/17/21 132/78  ?09/28/21 140/86  ?12/12/20 138/84  ? ? ?Wt Readings from Last 3 Encounters:  ?10/17/21 162 lb (73.5 kg)  ?12/12/20 160 lb (72.6 kg)  ?10/31/20 160 lb (72.6 kg)  ? ? ?Physical Exam ?Vitals reviewed.  ?HENT:  ?   Nose: Nose normal.  ?   Mouth/Throat:  ?   Mouth: Mucous membranes are moist.  ?Eyes:  ?   General: No scleral icterus. ?   Conjunctiva/sclera: Conjunctivae normal.  ?Cardiovascular:  ?   Rate and Rhythm: Normal rate and regular rhythm.  ?  Heart sounds: No murmur heard. ?Pulmonary:  ?   Effort: Pulmonary effort is normal.  ?   Breath sounds: No stridor. No wheezing, rhonchi or rales.  ?Abdominal:  ?   General: Abdomen is flat.  ?   Palpations: There is no mass.  ?   Tenderness: There is no abdominal tenderness. There is no guarding.  ?   Hernia: No hernia is present.  ?Musculoskeletal:     ?   General: Normal range of motion.  ?   Cervical back: Neck supple.  ?   Right lower leg: No edema.  ?   Left lower leg: No edema.  ?Lymphadenopathy:  ?   Cervical: No  cervical adenopathy.  ?Skin: ?   General: Skin is warm and dry.  ?Neurological:  ?   General: No focal deficit present.  ?   Mental Status: She is alert.  ?Psychiatric:     ?   Mood and Affect: Mood normal.     ?   Behavior: Behavior normal.  ? ? ?Lab Results  ?Component Value Date  ? WBC 6.6 10/17/2021  ? HGB 13.8 10/17/2021  ? HCT 41.2 10/17/2021  ? PLT 245.0 10/17/2021  ? GLUCOSE 88 10/17/2021  ? CHOL 179 12/12/2020  ? TRIG 80.0 12/12/2020  ? HDL 44.70 12/12/2020  ? LDLCALC 118 (H) 12/12/2020  ? ALT 12 12/12/2020  ? AST 17 12/12/2020  ? NA 137 10/17/2021  ? K 4.1 10/17/2021  ? CL 100 10/17/2021  ? CREATININE 1.13 10/17/2021  ? BUN 19 10/17/2021  ? CO2 30 10/17/2021  ? TSH 0.86 12/12/2020  ? INR 1.2 04/25/2019  ? ? ?No results found. ? ?Assessment & Plan:  ? ?Rosamary was seen today for hypertension and hyperlipidemia. ? ?Diagnoses and all orders for this visit: ? ?Tobacco abuse disorder ?-     varenicline (CHANTIX) 1 MG tablet; Take 1 tablet (1 mg total) by mouth 2 (two) times daily. ?-     Varenicline Tartrate, Starter, (CHANTIX STARTING MONTH PAK) 0.5 MG X 11 & 1 MG X 42 TBPK; Take one 0.5 mg tablet by mouth once daily for 3 days, then increase to one 0.5 mg tablet twice daily for 4 days, then increase to one 1 mg tablet twice daily. ?-     Ambulatory Referral for Lung Cancer Scre ? ?Spondylosis of cervical region without myelopathy or radiculopathy ?-     meloxicam (MOBIC) 7.5 MG tablet; Take 1 tablet (7.5 mg total) by mouth daily. ? ?Hyperlipidemia LDL goal <130 ?-     rosuvastatin (CRESTOR) 10 MG tablet; Take 1 tablet (10 mg total) by mouth daily. ? ?Essential hypertension, benign- Her blood pressure is adequately well controlled.  Electrolytes and renal function are normal. ?-     triamterene-hydrochlorothiazide (DYAZIDE) 37.5-25 MG capsule; Take 1 each (1 capsule total) by mouth daily. ?-     Basic metabolic panel; Future ?-     CBC with Differential/Platelet; Future ?-     CBC with Differential/Platelet ?-      Basic metabolic panel ? ?Primary insomnia ?-     Suvorexant (BELSOMRA) 20 MG TABS; Take 20 mg by mouth at bedtime as needed. ? ?Memory difficulties ?-     Ambulatory referral to Neurology ? ? ?I have discontinued [place] Rhubarb (ESTROVEN COMPLETE PO), nebivolol, and zolpidem. I have also changed her varenicline. Additionally, I am having her start on Chantix Starting Month Pak and Belsomra. Lastly, I am having her maintain her metroNIDAZOLE,  meloxicam, rosuvastatin, and triamterene-hydrochlorothiazide. ? ?Meds ordered this encounter  ?Medications  ? meloxicam (MOBIC) 7.5 MG tablet  ?  Sig: Take 1 tablet (7.5 mg total) by mouth daily.  ?  Dispense:  90 tablet  ?  Refill:  1  ? rosuvastatin (CRESTOR) 10 MG tablet  ?  Sig: Take 1 tablet (10 mg total) by mouth daily.  ?  Dispense:  90 tablet  ?  Refill:  1  ? triamterene-hydrochlorothiazide (DYAZIDE) 37.5-25 MG capsule  ?  Sig: Take 1 each (1 capsule total) by mouth daily.  ?  Dispense:  90 capsule  ?  Refill:  1  ? varenicline (CHANTIX) 1 MG tablet  ?  Sig: Take 1 tablet (1 mg total) by mouth 2 (two) times daily.  ?  Dispense:  30 tablet  ?  Refill:  3  ? Varenicline Tartrate, Starter, (CHANTIX STARTING MONTH PAK) 0.5 MG X 11 & 1 MG X 42 TBPK  ?  Sig: Take one 0.5 mg tablet by mouth once daily for 3 days, then increase to one 0.5 mg tablet twice daily for 4 days, then increase to one 1 mg tablet twice daily.  ?  Dispense:  53 each  ?  Refill:  1  ? Suvorexant (BELSOMRA) 20 MG TABS  ?  Sig: Take 20 mg by mouth at bedtime as needed.  ?  Dispense:  90 tablet  ?  Refill:  1  ? ? ? ?Follow-up: Return in about 6 months (around 04/18/2022). ? ?Scarlette Calico, MD ?

## 2021-10-17 NOTE — Patient Instructions (Signed)
Hypertension, Adult High blood pressure (hypertension) is when the force of blood pumping through the arteries is too strong. The arteries are the blood vessels that carry blood from the heart throughout the body. Hypertension forces the heart to work harder to pump blood and may cause arteries to become narrow or stiff. Untreated or uncontrolled hypertension can lead to a heart attack, heart failure, a stroke, kidney disease, and other problems. A blood pressure reading consists of a higher number over a lower number. Ideally, your blood pressure should be below 120/80. The first ("top") number is called the systolic pressure. It is a measure of the pressure in your arteries as your heart beats. The second ("bottom") number is called the diastolic pressure. It is a measure of the pressure in your arteries as the heart relaxes. What are the causes? The exact cause of this condition is not known. There are some conditions that result in high blood pressure. What increases the risk? Certain factors may make you more likely to develop high blood pressure. Some of these risk factors are under your control, including: Smoking. Not getting enough exercise or physical activity. Being overweight. Having too much fat, sugar, calories, or salt (sodium) in your diet. Drinking too much alcohol. Other risk factors include: Having a personal history of heart disease, diabetes, high cholesterol, or kidney disease. Stress. Having a family history of high blood pressure and high cholesterol. Having obstructive sleep apnea. Age. The risk increases with age. What are the signs or symptoms? High blood pressure may not cause symptoms. Very high blood pressure (hypertensive crisis) may cause: Headache. Fast or irregular heartbeats (palpitations). Shortness of breath. Nosebleed. Nausea and vomiting. Vision changes. Severe chest pain, dizziness, and seizures. How is this diagnosed? This condition is diagnosed by  measuring your blood pressure while you are seated, with your arm resting on a flat surface, your legs uncrossed, and your feet flat on the floor. The cuff of the blood pressure monitor will be placed directly against the skin of your upper arm at the level of your heart. Blood pressure should be measured at least twice using the same arm. Certain conditions can cause a difference in blood pressure between your right and left arms. If you have a high blood pressure reading during one visit or you have normal blood pressure with other risk factors, you may be asked to: Return on a different day to have your blood pressure checked again. Monitor your blood pressure at home for 1 week or longer. If you are diagnosed with hypertension, you may have other blood or imaging tests to help your health care provider understand your overall risk for other conditions. How is this treated? This condition is treated by making healthy lifestyle changes, such as eating healthy foods, exercising more, and reducing your alcohol intake. You may be referred for counseling on a healthy diet and physical activity. Your health care provider may prescribe medicine if lifestyle changes are not enough to get your blood pressure under control and if: Your systolic blood pressure is above 130. Your diastolic blood pressure is above 80. Your personal target blood pressure may vary depending on your medical conditions, your age, and other factors. Follow these instructions at home: Eating and drinking  Eat a diet that is high in fiber and potassium, and low in sodium, added sugar, and fat. An example of this eating plan is called the DASH diet. DASH stands for Dietary Approaches to Stop Hypertension. To eat this way: Eat   plenty of fresh fruits and vegetables. Try to fill one half of your plate at each meal with fruits and vegetables. Eat whole grains, such as whole-wheat pasta, brown rice, or whole-grain bread. Fill about one  fourth of your plate with whole grains. Eat or drink low-fat dairy products, such as skim milk or low-fat yogurt. Avoid fatty cuts of meat, processed or cured meats, and poultry with skin. Fill about one fourth of your plate with lean proteins, such as fish, chicken without skin, beans, eggs, or tofu. Avoid pre-made and processed foods. These tend to be higher in sodium, added sugar, and fat. Reduce your daily sodium intake. Many people with hypertension should eat less than 1,500 mg of sodium a day. Do not drink alcohol if: Your health care provider tells you not to drink. You are pregnant, may be pregnant, or are planning to become pregnant. If you drink alcohol: Limit how much you have to: 0-1 drink a day for women. 0-2 drinks a day for men. Know how much alcohol is in your drink. In the [place] one drink equals one 12 oz bottle of beer (355 mL), one 5 oz glass of wine (148 mL), or one 1 oz glass of hard liquor (44 mL). Lifestyle  Work with your health care provider to maintain a healthy body weight or to lose weight. Ask what an ideal weight is for you. Get at least 30 minutes of exercise that causes your heart to beat faster (aerobic exercise) most days of the week. Activities may include walking, swimming, or biking. Include exercise to strengthen your muscles (resistance exercise), such as Pilates or lifting weights, as part of your weekly exercise routine. Try to do these types of exercises for 30 minutes at least 3 days a week. Do not use any products that contain nicotine or tobacco. These products include cigarettes, chewing tobacco, and vaping devices, such as e-cigarettes. If you need help quitting, ask your health care provider. Monitor your blood pressure at home as told by your health care provider. Keep all follow-up visits. This is important. Medicines Take over-the-counter and prescription medicines only as told by your health care provider. Follow directions carefully. Blood  pressure medicines must be taken as prescribed. Do not skip doses of blood pressure medicine. Doing this puts you at risk for problems and can make the medicine less effective. Ask your health care provider about side effects or reactions to medicines that you should watch for. Contact a health care provider if you: Think you are having a reaction to a medicine you are taking. Have headaches that keep coming back (recurring). Feel dizzy. Have swelling in your ankles. Have trouble with your vision. Get help right away if you: Develop a severe headache or confusion. Have unusual weakness or numbness. Feel faint. Have severe pain in your chest or abdomen. Vomit repeatedly. Have trouble breathing. These symptoms may be an emergency. Get help right away. Call 911. Do not wait to see if the symptoms will go away. Do not drive yourself to the hospital. Summary Hypertension is when the force of blood pumping through your arteries is too strong. If this condition is not controlled, it may put you at risk for serious complications. Your personal target blood pressure may vary depending on your medical conditions, your age, and other factors. For most people, a normal blood pressure is less than 120/80. Hypertension is treated with lifestyle changes, medicines, or a combination of both. Lifestyle changes include losing weight, eating a healthy,   low-sodium diet, exercising more, and limiting alcohol. This information is not intended to replace advice given to you by your health care provider. Make sure you discuss any questions you have with your health care provider. Document Revised: 04/25/2021 Document Reviewed: 04/25/2021 Elsevier Patient Education  2023 Else[employer] Inc[employer]  

## 2021-10-18 ENCOUNTER — Encounter: Payer: Self-pay | Admitting: Internal Medicine

## 2021-10-23 ENCOUNTER — Other Ambulatory Visit: Payer: Self-pay | Admitting: Internal Medicine

## 2021-10-23 DIAGNOSIS — F5101 Primary insomnia: Secondary | ICD-10-CM

## 2021-10-23 MED ORDER — ESZOPICLONE 3 MG PO TABS: 3.0000 mg | ORAL_TABLET | Freq: Every day | ORAL | 1 refills | Status: DC

## 2021-10-24 IMAGING — CT CT ABD-PELV W/ CM
2 of 4 series · 16 of 46 positions shown, 18 images · IV contrast (APPLIED)
Comparison: 04/28/2019

CLINICAL DATA: Follow-up abdominal and pelvic abscesses following
percutaneous catheter drainage. Postop from hysterectomy.

EXAM:
CT ABDOMEN AND PELVIS WITH CONTRAST
TECHNIQUE: Multidetector CT imaging of the abdomen and pelvis was performed
using the standard protocol following bolus administration of
intravenous contrast.
CONTRAST:  100mL OMNIPAQUE IOHEXOL 300 MG/ML  SOLN

[Series 2: axial st · axial · 0.65mm/px · z∈[-479,-69]mm · 13 of 90 slices shown, 15 images]
[im 4/90  soft-tissue]
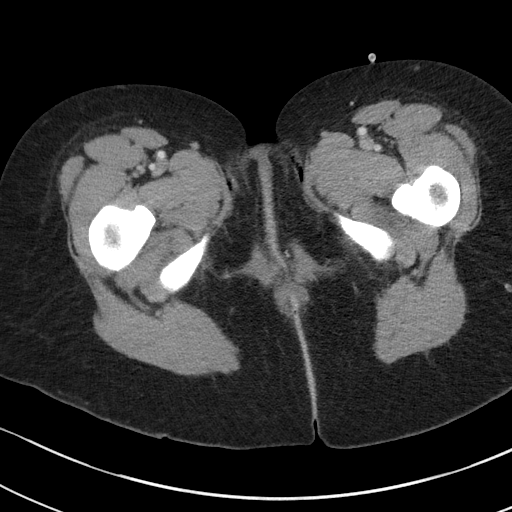
[im 4/90  bone]
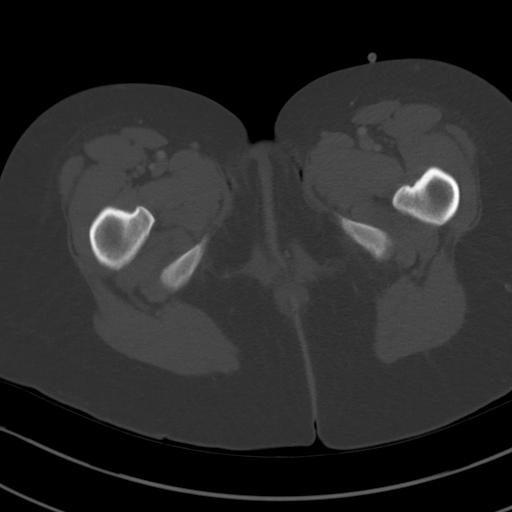
[im 12/90  soft-tissue]
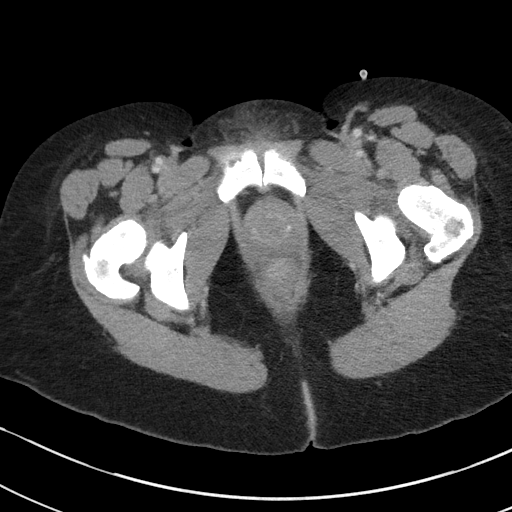
[im 20/90  soft-tissue]
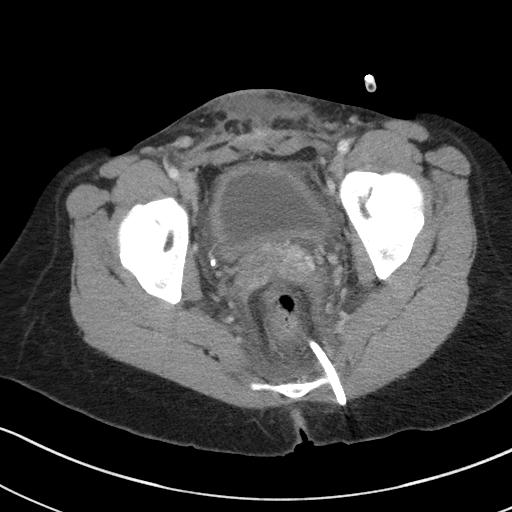
[im 24/90  soft-tissue]
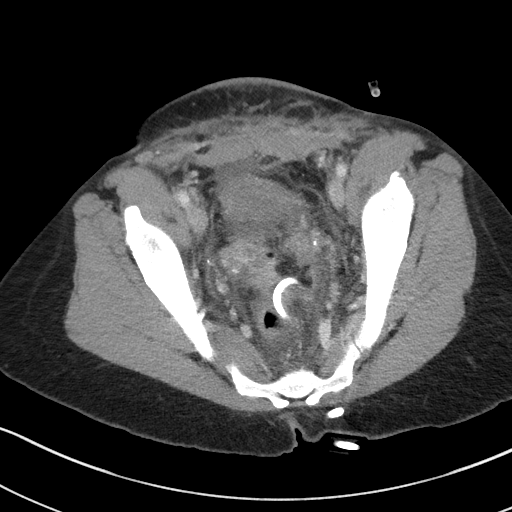
[im 31/90  soft-tissue]
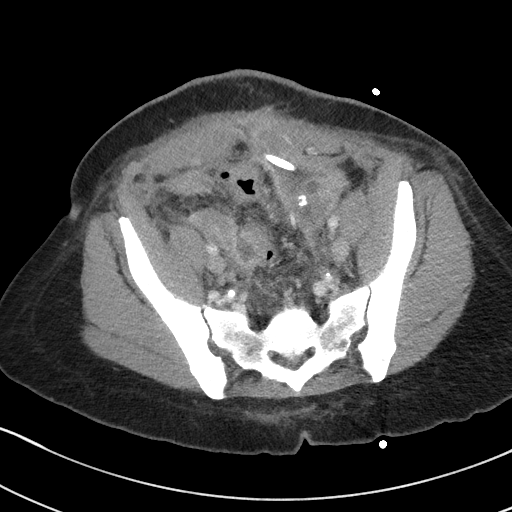
[im 39/90  soft-tissue]
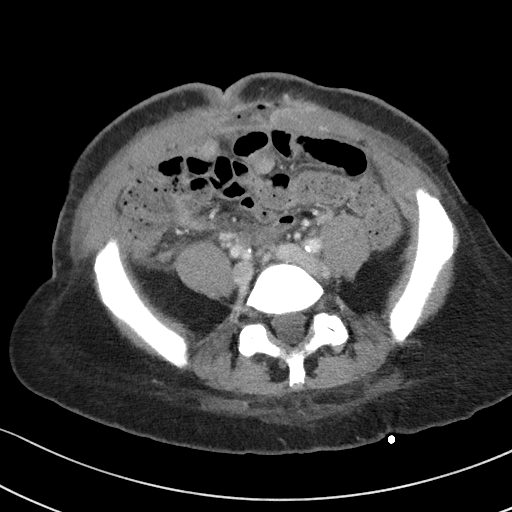
[im 47/90  soft-tissue]
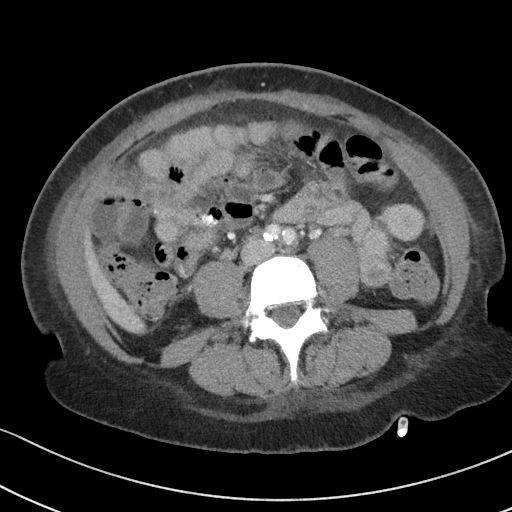
[im 51/90  soft-tissue]
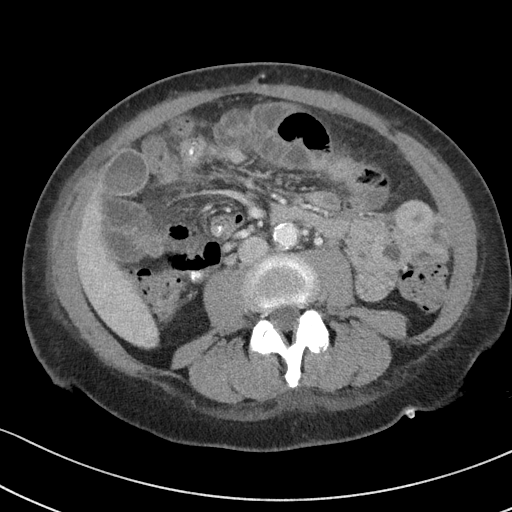
[im 59/90  soft-tissue]
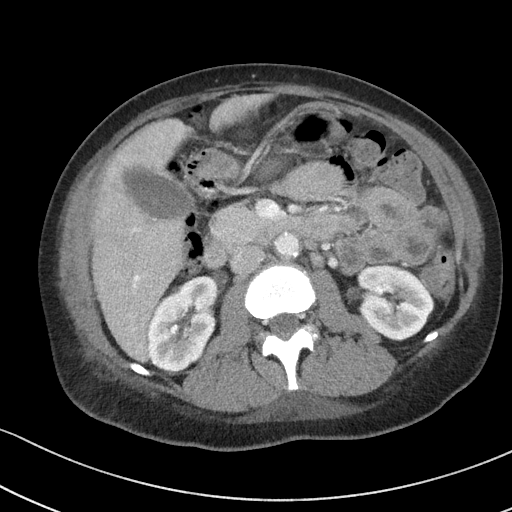
[im 59/90  bone]
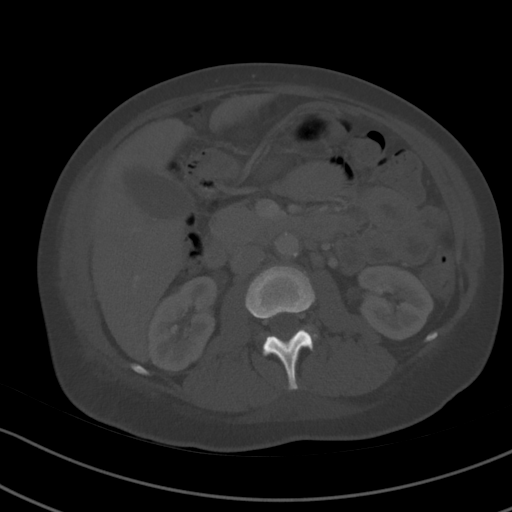
[im 66/90  soft-tissue]
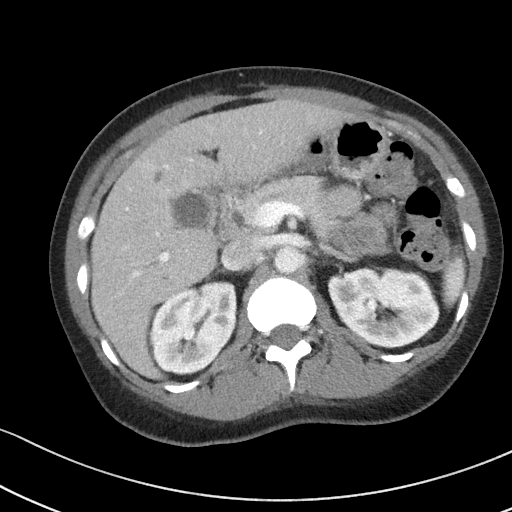
[im 70/90  soft-tissue]
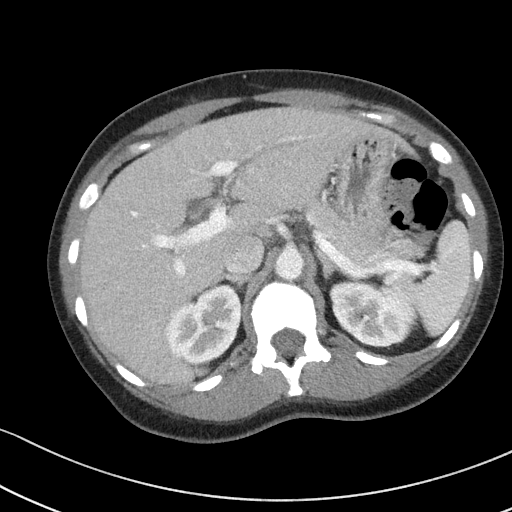
[im 78/90  soft-tissue]
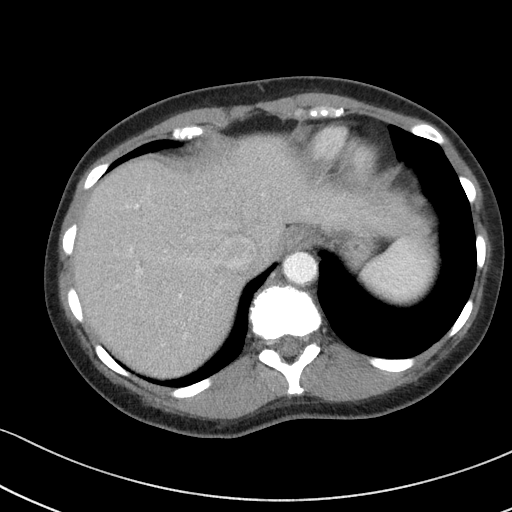
[im 86/90  soft-tissue]
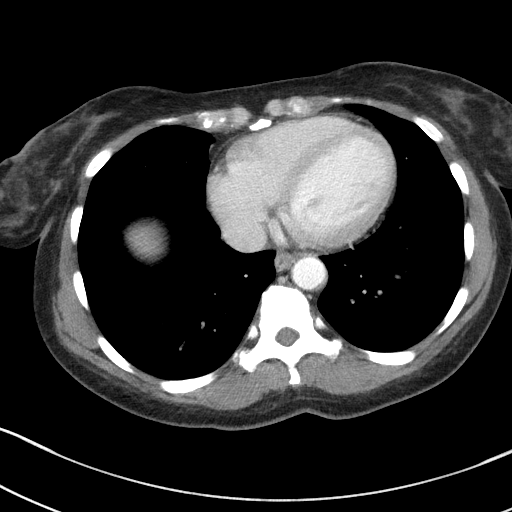

[Series 602: <mpr thick range> · coronal · 0.88mm/px · 3 of 135 slices shown]
[im 45/135  soft-tissue]
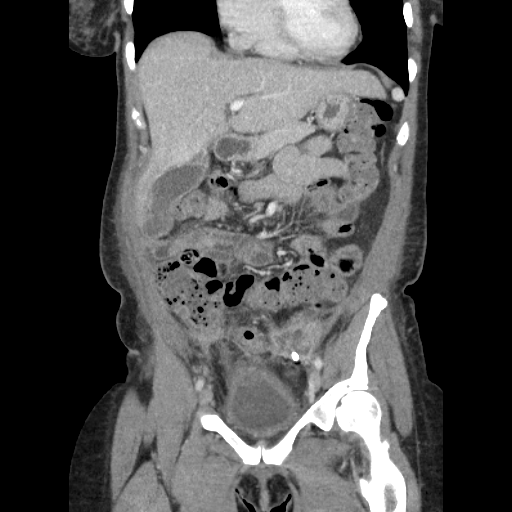
[im 60/135  soft-tissue]
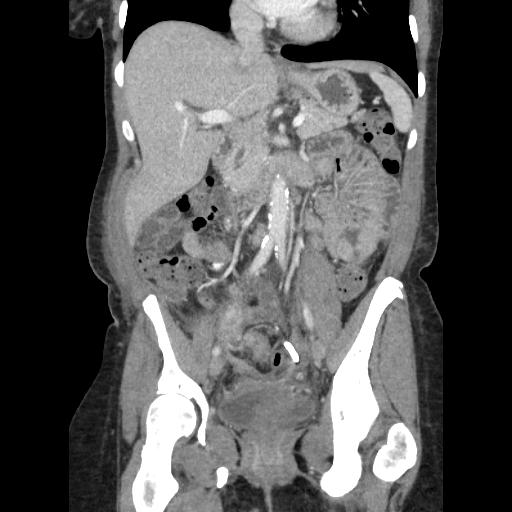
[im 75/135  soft-tissue]
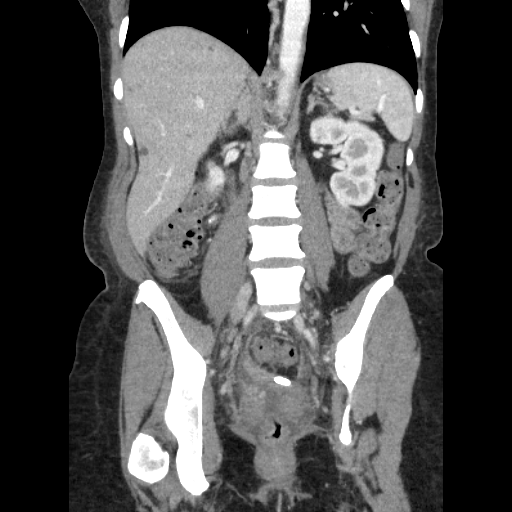

[16 of 46 positions shown; findings below may reference images not displayed]

FINDINGS: Lower Chest: No acute findings.

Hepatobiliary: No hepatic masses identified. Several small cysts in
right and left lobes remains stable. Gallstones are seen, however
there is no evidence of cholecystitis or biliary dilatation.

Pancreas:  No mass or inflammatory changes.

Spleen: Within normal limits in size and appearance.

Adrenals/Urinary Tract: No masses identified. No evidence of
hydronephrosis.

Stomach/Bowel: No evidence of obstruction, inflammatory process or
abnormal fluid collections.

Vascular/Lymphatic: No pathologically enlarged lymph nodes. No
abdominal aortic aneurysm. Aortic atherosclerosis.

Reproductive: Postop changes from hysterectomy again noted. A left
transgluteal percutaneous drainage catheter is again seen in the
pelvic cul-de-sac, with no residual fluid collections seen at this
site. A new percutaneous drainage catheter is seen in the left lower
quadrant. Rim enhancing fluid collection at this site has
significantly decreased in size, currently measuring 3.4 x 3.1 cm on
image 62/2, compared to 6.7 x 5.6 cm previously. A small fluid
collection in the midline anterior abdominal wall soft tissues has
decreased in size, currently measuring 3.3 x 1.5 cm on image 54/2,
compared to 5.6 x 2.7 cm previously. A small rim enhancing fluid
collection in the right adnexa measures 3.3 by 1.1 cm, and has not
significantly changed. No new or enlarging abscess identified.

Other:  None.

Musculoskeletal:  No suspicious bone lesions identified.
IMPRESSION: Significant decrease in size of left lower quadrant abscess
following percutaneous drainage catheter placement. No significant
residual fluid collection at site of left transgluteal drainage
catheter in the pelvic cul-de-sac.

Decreased size of small abscess in midline anterior abdominal wall
soft tissues. Stable small abscess in right adnexal region.

No new or enlarging abscess identified within the pelvis or abdomen.

Cholelithiasis. No radiographic evidence of cholecystitis.

Aortic Atherosclerosis (V515C-AUT.T).

## 2021-10-30 ENCOUNTER — Other Ambulatory Visit: Payer: Self-pay | Admitting: Nurse Practitioner

## 2021-10-30 ENCOUNTER — Encounter: Payer: Self-pay | Admitting: Nurse Practitioner

## 2021-10-30 DIAGNOSIS — B9689 Other specified bacterial agents as the cause of diseases classified elsewhere: Secondary | ICD-10-CM

## 2021-10-30 MED ORDER — METRONIDAZOLE 0.75 % VA GEL: 1.0000 | VAGINAL | 0 refills | Status: DC

## 2021-10-30 NOTE — Telephone Encounter (Signed)
Per your mychart msg- suppressive treatment is for twice weekly for 3-4 months. Please confirm with pharmacy. Thank you! ?

## 2021-10-31 ENCOUNTER — Ambulatory Visit: Payer: 59 | Admitting: Nurse Practitioner

## 2021-11-07 ENCOUNTER — Ambulatory Visit: Payer: 59 | Admitting: Nurse Practitioner

## 2021-11-08 ENCOUNTER — Telehealth: Payer: Self-pay

## 2021-11-08 NOTE — Telephone Encounter (Signed)
Noted  

## 2021-11-08 NOTE — Telephone Encounter (Signed)
Someone called on behalf of pt with her present calling to report she has had 5 days of uncontrollable. I advised the pt that she should go to the ER per nurse. ? ?FYI ?

## 2022-03-12 ENCOUNTER — Other Ambulatory Visit: Payer: Self-pay | Admitting: Internal Medicine

## 2022-03-12 DIAGNOSIS — M47812 Spondylosis without myelopathy or radiculopathy, cervical region: Secondary | ICD-10-CM

## 2022-03-15 ENCOUNTER — Ambulatory Visit: Payer: 59 | Admitting: Nurse Practitioner

## 2022-03-15 ENCOUNTER — Encounter: Payer: Self-pay | Admitting: Nurse Practitioner

## 2022-03-15 VITALS — BP 108/68 | HR 60 | Resp 16 | Ht 66.5 in | Wt 152.0 lb

## 2022-03-15 DIAGNOSIS — N951 Menopausal and female climacteric states: Secondary | ICD-10-CM | POA: Diagnosis not present

## 2022-03-15 DIAGNOSIS — Z01419 Encounter for gynecological examination (general) (routine) without abnormal findings: Secondary | ICD-10-CM

## 2022-03-15 DIAGNOSIS — N898 Other specified noninflammatory disorders of vagina: Secondary | ICD-10-CM | POA: Diagnosis not present

## 2022-03-15 DIAGNOSIS — Z113 Encounter for screening for infections with a predominantly sexual mode of transmission: Secondary | ICD-10-CM

## 2022-03-15 MED ORDER — VEOZAH 45 MG PO TABS: 1.0000 | ORAL_TABLET | Freq: Every day | ORAL | 1 refills | Status: DC

## 2022-03-15 NOTE — Progress Notes (Signed)
Samantha Reed 06-25-1971 [id number]   History:  51 y.o. [id number] presents for annual exam. Complains of hot flashes and night sweats. 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 She has friend using estradiol patch and is interested. S/P TAH for fibroids. 2011 LEEP CIN-2/3, subsequent normal. HTN, HLD managed by PCP. Quit smoking in April.   Gynecologic History Patient's last menstrual period was 11/03/2018.   Contraception/Family planning: status post hysterectomy Sexually active: Yes  Health Maintenance Last Pap (vaginal): 10/31/2020. Results were: Normal neg HPV Last mammogram: 03/22/2021. Results were: Focal shadowing versus left breast mass, biopsy recommended but pt did not follow up Last colonoscopy: 8 years ago per patient Last Dexa: Not indicated  Past medical history, past surgical history, family history and social history were all reviewed and documented in the EPIC chart. Works in Administrator, arts for [employer]. 51 yo and 52 yo children, 33 yo granddaughter.  ROS:  A ROS was performed and pertinent positives and negatives are included.  Exam:  Vitals:   03/15/22 0912  BP: 108/68  Pulse: 60  Resp: 16  Weight: 152 lb (68.9 kg)  Height: 5' 6.5" (1.689 m)   Body mass index is 24.17 kg/m.  General appearance:  Normal Thyroid:  Symmetrical, normal in size, without palpable masses or nodularity. Respiratory  Auscultation:  Clear without wheezing or rhonchi Cardiovascular  Auscultation:  Regular rate, without rubs, murmurs or gallops  Edema/varicosities:  Not grossly evident Abdominal  Soft,nontender, without masses, guarding or rebound.  Liver/spleen:  No organomegaly noted  Hernia:  None appreciated  Skin  Inspection:  Grossly normal Breasts: Examined lying and sitting.   Right: Without masses, retractions, nipple discharge or axillary adenopathy.   Left: Without masses, retractions, nipple discharge or axillary  adenopathy. Genitourinary   Inguinal/mons:  Normal without inguinal adenopathy  External genitalia:  Normal appearing vulva with no masses, tenderness, or lesions  BUS/Urethra/Skene's glands:  Normal  Vagina:  Discharge present, no erythema  Cervix:  and uterus absent  Adnexa/parametria:     Rt: Normal in size, without masses or tenderness.   Lt: Normal in size, without masses or tenderness.  Anus and perineum: Normal  Digital rectal exam: Normal sphincter tone without palpated masses or tenderness  Patient informed chaperone available to be present for breast and pelvic exam. Patient has requested no chaperone to be present. Patient has been advised what will be completed during breast and pelvic exam.   Assessment/Plan:  51 y.o. [id number] for annual exam.   Well female exam with routine gynecological exam - Education provided on SBEs, importance of preventative screenings, current guidelines, high calcium diet, regular exercise, and multivitamin daily. Labs with PCP.   Vasomotor symptoms due to menopause - Plan: Fezolinetant (VEOZAH) 45 MG TABS daily. Aware of FDA approved medication for postmenopausal hot flashes. Has been using OTC Estroven and it is no longer working and hot flashes are intolerable. She wants to discuss estradiol patch because a friend of hers uses it. Due to H/O HTN and long history of smoking (quit 5 months ago) it is recommended she try non-hormonal treatment first and she is agreeable.   Screen for STD (sexually transmitted disease) - Plan: RPR, HIV Antibody (routine testing w rflx), SureSwab Advanced Vaginitis Plus,TMA  Vaginal discharge - Plan: SureSwab Advanced Vaginitis Plus,TMA  Screening for cervical cancer - 2011 LEEP CIN-2/3, normal paps since.  Will repeat at 3-year interval per guidelines.  Screening for breast cancer - Abnormal  mammogram 03/2021 with recommendations for biopsy but patient did not follow up. Discussed importance of this. She will call  imaging center. Normal breast exam today.  Screening for colon cancer - Colonoscopy about 8 years ago per patient. Recommend repeating at 10 year interval.   Screening for osteoporosis - Average risk. Will plan DXA at age 51.   Return in 1 year for annual.     Tamela Gammon DNP, 10:59 AM 03/15/2022

## 2022-03-16 LAB — SURESWAB® ADVANCED VAGINITIS PLUS,TMA
C. trachomatis RNA, TMA: NOT DETECTED
CANDIDA SPECIES: NOT DETECTED
Candida glabrata: NOT DETECTED
N. gonorrhoeae RNA, TMA: NOT DETECTED
SURESWAB(R) ADV BACTERIAL VAGINOSIS(BV),TMA: POSITIVE — AB
TRICHOMONAS VAGINALIS (TV),TMA: NOT DETECTED

## 2022-03-16 LAB — HIV ANTIBODY (ROUTINE TESTING W REFLEX): HIV 1&2 Ab, 4th Generation: NONREACTIVE

## 2022-03-16 LAB — RPR: RPR Ser Ql: NONREACTIVE

## 2022-03-19 ENCOUNTER — Other Ambulatory Visit: Payer: Self-pay | Admitting: Nurse Practitioner

## 2022-03-19 DIAGNOSIS — N760 Acute vaginitis: Secondary | ICD-10-CM

## 2022-03-19 MED ORDER — METRONIDAZOLE 0.75 % VA GEL: 1.0000 | Freq: Every day | VAGINAL | 0 refills | Status: AC

## 2022-03-20 ENCOUNTER — Encounter: Payer: Self-pay | Admitting: Nurse Practitioner

## 2022-03-28 ENCOUNTER — Other Ambulatory Visit: Payer: Self-pay | Admitting: Nurse Practitioner

## 2022-03-28 DIAGNOSIS — N760 Acute vaginitis: Secondary | ICD-10-CM

## 2022-03-28 DIAGNOSIS — B9689 Other specified bacterial agents as the cause of diseases classified elsewhere: Secondary | ICD-10-CM

## 2022-03-28 MED ORDER — METRONIDAZOLE 500 MG PO TABS: 500.0000 mg | ORAL_TABLET | Freq: Two times a day (BID) | ORAL | 0 refills | Status: DC

## 2022-03-28 MED ORDER — METRONIDAZOLE 0.75 % VA GEL: 1.0000 | VAGINAL | 0 refills | Status: AC

## 2022-06-08 ENCOUNTER — Other Ambulatory Visit: Payer: Self-pay | Admitting: Internal Medicine

## 2022-06-08 DIAGNOSIS — F5101 Primary insomnia: Secondary | ICD-10-CM

## 2022-06-12 ENCOUNTER — Telehealth: Payer: Self-pay | Admitting: Internal Medicine

## 2022-06-12 ENCOUNTER — Other Ambulatory Visit: Payer: Self-pay | Admitting: Internal Medicine

## 2022-06-12 DIAGNOSIS — F5101 Primary insomnia: Secondary | ICD-10-CM

## 2022-06-12 MED ORDER — ESZOPICLONE 3 MG PO TABS: 3.0000 mg | ORAL_TABLET | Freq: Every day | ORAL | 0 refills | Status: DC

## 2022-06-12 NOTE — Telephone Encounter (Signed)
Caller & Relationship to patient: Self  Call back number: [phone]   Date of last office visit: 4.18.23  Date of next office visit: 1.9.24  Medication(s) to be refilled:  Eszopiclone 3 MG TABS   Preferred Pharmacy:   [id number]  Phone: [phone]  Fax: [phone]

## 2022-06-13 ENCOUNTER — Ambulatory Visit: Payer: 59 | Admitting: Internal Medicine

## 2022-06-20 ENCOUNTER — Other Ambulatory Visit: Payer: Self-pay | Admitting: Internal Medicine

## 2022-06-20 DIAGNOSIS — I10 Essential (primary) hypertension: Secondary | ICD-10-CM

## 2022-06-20 DIAGNOSIS — E785 Hyperlipidemia, unspecified: Secondary | ICD-10-CM

## 2022-07-10 ENCOUNTER — Ambulatory Visit: Payer: 59 | Admitting: Internal Medicine

## 2022-07-10 ENCOUNTER — Encounter: Payer: Self-pay | Admitting: Internal Medicine

## 2022-07-10 ENCOUNTER — Ambulatory Visit (INDEPENDENT_AMBULATORY_CARE_PROVIDER_SITE_OTHER): Payer: 59

## 2022-07-10 VITALS — BP 158/94 | HR 62 | Temp 98.5°F | Resp 16 | Ht 66.5 in | Wt 162.0 lb

## 2022-07-10 DIAGNOSIS — E785 Hyperlipidemia, unspecified: Secondary | ICD-10-CM | POA: Diagnosis not present

## 2022-07-10 DIAGNOSIS — Z0001 Encounter for general adult medical examination with abnormal findings: Secondary | ICD-10-CM | POA: Diagnosis not present

## 2022-07-10 DIAGNOSIS — R10826 Epigastric rebound abdominal tenderness: Secondary | ICD-10-CM

## 2022-07-10 DIAGNOSIS — R002 Palpitations: Secondary | ICD-10-CM

## 2022-07-10 DIAGNOSIS — D649 Anemia, unspecified: Secondary | ICD-10-CM

## 2022-07-10 DIAGNOSIS — I10 Essential (primary) hypertension: Secondary | ICD-10-CM

## 2022-07-10 DIAGNOSIS — Z1231 Encounter for screening mammogram for malignant neoplasm of breast: Secondary | ICD-10-CM

## 2022-07-10 DIAGNOSIS — R001 Bradycardia, unspecified: Secondary | ICD-10-CM

## 2022-07-10 DIAGNOSIS — K5904 Chronic idiopathic constipation: Secondary | ICD-10-CM | POA: Diagnosis not present

## 2022-07-10 DIAGNOSIS — R3121 Asymptomatic microscopic hematuria: Secondary | ICD-10-CM

## 2022-07-10 DIAGNOSIS — Z87891 Personal history of nicotine dependence: Secondary | ICD-10-CM

## 2022-07-10 NOTE — Progress Notes (Signed)
Subjective:  Patient ID: Samantha Reed, female    DOB: [birth year]  Age: 52 y.o. MRN: [id number]  CC: Abdominal Pain, Hypertension, and Hyperlipidemia   HPI Samantha Reed presents for a CPX and f/up -  She hasn't taken dyazide in 5 days.  She complains of a several month history of tightness in her stomach with weight changes and decreased appetite.  She complains of constipation and chronic palpitations.  She denies chest pain, shortness of breath, cough, diaphoresis, diarrhea, bright red blood per rectum, or syncope.  Outpatient Medications Prior to Visit  Medication Sig Dispense Refill   Eszopiclone 3 MG TABS Take 1 tablet (3 mg total) by mouth at bedtime. Take immediately before bedtime 30 tablet 0   Fezolinetant (VEOZAH) 45 MG TABS Take 1 tablet by mouth daily. 90 tablet 1   meloxicam (MOBIC) 7.5 MG tablet Take 7.5 mg by mouth daily.     metroNIDAZOLE (FLAGYL) 500 MG tablet Take 1 tablet (500 mg total) by mouth 2 (two) times daily. 14 tablet 0   rosuvastatin (CRESTOR) 10 MG tablet Take 1 tablet (10 mg total) by mouth daily. 90 tablet 1   triamterene-hydrochlorothiazide (DYAZIDE) 37.5-25 MG capsule Take 1 each (1 capsule total) by mouth daily. 90 capsule 1   No facility-administered medications prior to visit.    ROS Review of Systems  Constitutional:  Positive for unexpected weight change (wt gain). Negative for activity change, appetite change, chills, diaphoresis and fatigue.  HENT: Negative.  Negative for trouble swallowing and voice change.   Eyes: Negative.   Respiratory: Negative.  Negative for cough, choking, chest tightness, shortness of breath, wheezing and stridor.   Cardiovascular:  Positive for palpitations. Negative for chest pain and leg swelling.  Gastrointestinal:  Positive for abdominal pain and constipation. Negative for blood in stool, nausea and vomiting.       +++ "tightness in her abd"  Endocrine: Negative.   Genitourinary: Negative.  Negative  for difficulty urinating, dysuria and hematuria.  Musculoskeletal:  Negative for arthralgias and myalgias.  Skin: Negative.  Negative for color change and rash.  Neurological:  Negative for dizziness, weakness, light-headedness and headaches.  Hematological:  Negative for adenopathy. Does not bruise/bleed easily.  Psychiatric/Behavioral:  Negative for confusion, decreased concentration, dysphoric mood, sleep disturbance and suicidal ideas. The patient is nervous/anxious.     Objective:  BP (!) 158/94 (BP Location: Right Arm, Patient Position: Sitting, Cuff Size: Large)   Pulse 62   Temp 98.5 F (36.9 C) (Oral)   Resp 16   Ht 5' 6.5" (1.689 m)   Wt 162 lb (73.5 kg)   LMP 11/03/2018   SpO2 98%   BMI 25.76 kg/m   BP Readings from Last 3 Encounters:  07/10/22 (!) 158/94  03/15/22 108/68  10/17/21 132/78    Wt Readings from Last 3 Encounters:  07/10/22 162 lb (73.5 kg)  03/15/22 152 lb (68.9 kg)  10/17/21 162 lb (73.5 kg)    Physical Exam Vitals reviewed.  HENT:     Head: Normocephalic.     Nose: Nose normal.     Mouth/Throat:     Mouth: Mucous membranes are moist.  Eyes:     General: No scleral icterus.    Conjunctiva/sclera: Conjunctivae normal.  Cardiovascular:     Rate and Rhythm: Regular rhythm. Bradycardia present.     Heart sounds: Normal heart sounds, S1 normal and S2 normal. No murmur heard.    Comments: EKG- SB, 54 bpm No LVH, Q waves, or  ST/T wave changes Pulmonary:     Effort: Pulmonary effort is normal.     Breath sounds: No stridor. No wheezing, rhonchi or rales.  Abdominal:     General: Abdomen is scaphoid. There is no distension.     Palpations: Abdomen is soft.     Tenderness: There is generalized abdominal tenderness. There is no guarding or rebound.     Hernia: No hernia is present.  Musculoskeletal:     Cervical back: Neck supple.     Right lower leg: No edema.     Left lower leg: No edema.  Neurological:     Mental Status: She is alert.      Lab Results  Component Value Date   WBC 6.1 07/10/2022   HGB 11.9 (L) 07/10/2022   HCT 35.7 (L) 07/10/2022   PLT 305.0 07/10/2022   GLUCOSE 88 10/17/2021   CHOL 198 07/10/2022   TRIG 96.0 07/10/2022   HDL 52.00 07/10/2022   LDLCALC 126 (H) 07/10/2022   ALT 11 07/10/2022   AST 21 07/10/2022   NA 137 10/17/2021   K 4.1 10/17/2021   CL 100 10/17/2021   CREATININE 1.13 10/17/2021   BUN 19 10/17/2021   CO2 30 10/17/2021   TSH 2.01 07/10/2022   INR 1.2 04/25/2019    DG ABD ACUTE 2+V W 1V CHEST  Result Date: 07/10/2022 CLINICAL DATA:  Intermittent abdominal pain EXAM: DG ABDOMEN ACUTE WITH 1 VIEW CHEST COMPARISON:  None Available. FINDINGS: There is no evidence of dilated bowel loops or free intraperitoneal air. No radiopaque calculi or other significant radiographic abnormality is seen. Heart size and mediastinal contours are within normal limits. Both lungs are clear. IMPRESSION: Negative abdominal radiographs.  No acute cardiopulmonary disease. Electronically Signed   By: Jacqulynn Cadet M.D.   On: 07/10/2022 16:44     Assessment & Plan:   Samantha Reed was seen today for abdominal pain, hypertension and hyperlipidemia.  Diagnoses and all orders for this visit:  Essential hypertension, benign- Blood pressure is not adequately well-controlled.  Will restart to Dyazide. -     EKG 12-Lead -     CBC with Differential/Platelet; Future -     TSH; Future -     Urinalysis, Routine w reflex microscopic; Future -     Urinalysis, Routine w reflex microscopic -     TSH -     CBC with Differential/Platelet -     triamterene-hydrochlorothiazide (DYAZIDE) 37.5-25 MG capsule; Take 1 each (1 capsule total) by mouth daily.  Epigastric abdominal tenderness with rebound tenderness- Plain films are normal.  The only finding on labs are hematuria and a new onset anemia.  I have asked her to return to have the anemia evaluated.  She is a former smoker - will get a renal CT to evaluate for renal  lesions. -     DG ABD ACUTE 2+V W 1V CHEST; Future -     Lipase; Future -     Amylase; Future -     Urinalysis, Routine w reflex microscopic; Future -     Hepatic function panel; Future -     Hepatic function panel -     Urinalysis, Routine w reflex microscopic -     Amylase -     Lipase -     CT RENAL STONE STUDY; Future  Chronic idiopathic constipation - Labs are negative for causes. She does not want to treat this. -     Magnesium; Future -  TSH; Future -     TSH -     Magnesium  Hyperlipidemia LDL goal <130- Will restart the statin. -     Lipid panel; Future -     TSH; Future -     Hepatic function panel; Future -     Hepatic function panel -     TSH -     Lipid panel -     rosuvastatin (CRESTOR) 10 MG tablet; Take 1 tablet (10 mg total) by mouth daily.  Bradycardia by electrocardiogram -     CARDIAC EVENT MONITOR; Future  Palpitations -     CARDIAC EVENT MONITOR; Future  Encounter for general adult medical examination with abnormal findings- Exam completed, labs reviewed, vaccines reviewed, cancer screenings reviewed, patient education was given.  Visit for screening mammogram -     MM DIGITAL SCREENING BILATERAL; Future  Former smoker -     Ambulatory Referral for Lung Cancer Scre  Asymptomatic microscopic hematuria -     CT RENAL STONE STUDY; Future  Anemia, unspecified type   I have discontinued Samantha Reed's triamterene-hydrochlorothiazide and metroNIDAZOLE. I am also having her start on triamterene-hydrochlorothiazide. Additionally, I am having her maintain her meloxicam, Veozah, Eszopiclone, and rosuvastatin.  Meds ordered this encounter  Medications   triamterene-hydrochlorothiazide (DYAZIDE) 37.5-25 MG capsule    Sig: Take 1 each (1 capsule total) by mouth daily.    Dispense:  90 capsule    Refill:  0   rosuvastatin (CRESTOR) 10 MG tablet    Sig: Take 1 tablet (10 mg total) by mouth daily.    Dispense:  90 tablet    Refill:  1      Follow-up: Return in about 3 months (around 10/09/2022).  Scarlette Calico, MD

## 2022-07-10 NOTE — Patient Instructions (Signed)
Palpitations Palpitations are feelings that your heartbeat is irregular or is faster than normal. It may feel like your heart is fluttering or skipping a beat. Palpitations may be caused by many things, including smoking, caffeine, alcohol, stress, and certain medicines or drugs. Most causes of palpitations are not serious.  However, some palpitations can be a sign of a serious problem. Further tests and a thorough medical history will be done to find the cause of your palpitations. Your provider may order tests such as an ECG, labs, an echocardiogram, or an ambulatory continuous ECG monitor. Follow these instructions at home: Pay attention to any changes in your symptoms. Let your health care provider know about them. Take these actions to help manage your symptoms: Eating and drinking Follow instructions from your health care provider about eating or drinking restrictions. You may need to avoid foods and drinks that may cause palpitations. These may include: Caffeinated coffee, tea, soft drinks, and energy drinks. Chocolate. Alcohol. Diet pills. Lifestyle     Take steps to reduce your stress and anxiety. Things that can help you relax include: Yoga. Mind-body activities, such as deep breathing, meditation, or using words and images to create positive thoughts (guided imagery). Physical activity, such as swimming, jogging, or walking. Tell your health care provider if your palpitations increase with activity. If you have chest pain or shortness of breath with activity, do not continue the activity until you are seen by your health care provider. Biofeedback. This is a method that helps you learn to use your mind to control things in your body, such as your heartbeat. Get plenty of rest and sleep. Keep a regular bed time. Do not use drugs, including cocaine or ecstasy. Do not use marijuana. Do not use any products that contain nicotine or tobacco. These products include cigarettes, chewing  tobacco, and vaping devices, such as e-cigarettes. If you need help quitting, ask your health care provider. General instructions Take over-the-counter and prescription medicines only as told by your health care provider. Keep all follow-up visits. This is important. These may include visits for further testing if palpitations do not go away or get worse. Contact a health care provider if: You continue to have a fast or irregular heartbeat for a long period of time. You notice that your palpitations occur more often. Get help right away if: You have chest pain or shortness of breath. You have a severe headache. You feel dizzy or you faint. These symptoms may represent a serious problem that is an emergency. Do not wait to see if the symptoms will go away. Get medical help right away. Call your local emergency services (911 in the [place] Do not drive yourself to the hospital. Summary Palpitations are feelings that your heartbeat is irregular or is faster than normal. It may feel like your heart is fluttering or skipping a beat. Palpitations may be caused by many things, including smoking, caffeine, alcohol, stress, certain medicines, and drugs. Further tests and a thorough medical history may be done to find the cause of your palpitations. Get help right away if you faint or have chest pain, shortness of breath, severe headache, or dizziness. This information is not intended to replace advice given to you by your health care provider. Make sure you discuss any questions you have with your health care provider. Document Revised: 11/09/2020 Document Reviewed: 11/09/2020 Elsevier Patient Education  2023 [employer]vier Inc.  

## 2022-07-11 ENCOUNTER — Other Ambulatory Visit: Payer: Self-pay | Admitting: Internal Medicine

## 2022-07-11 DIAGNOSIS — E785 Hyperlipidemia, unspecified: Secondary | ICD-10-CM

## 2022-07-11 DIAGNOSIS — I10 Essential (primary) hypertension: Secondary | ICD-10-CM

## 2022-07-11 DIAGNOSIS — R3121 Asymptomatic microscopic hematuria: Secondary | ICD-10-CM | POA: Insufficient documentation

## 2022-07-11 DIAGNOSIS — Z87891 Personal history of nicotine dependence: Secondary | ICD-10-CM | POA: Insufficient documentation

## 2022-07-11 LAB — URINALYSIS, ROUTINE W REFLEX MICROSCOPIC
Bilirubin Urine: NEGATIVE
Leukocytes,Ua: NEGATIVE
Nitrite: NEGATIVE
Specific Gravity, Urine: 1.025 (ref 1.000–1.030)
Total Protein, Urine: NEGATIVE
Urine Glucose: NEGATIVE
Urobilinogen, UA: 0.2 (ref 0.0–1.0)
pH: 6 (ref 5.0–8.0)

## 2022-07-11 LAB — AMYLASE: Amylase: 66 U/L (ref 27–131)

## 2022-07-11 LAB — CBC WITH DIFFERENTIAL/PLATELET
Basophils Absolute: 0 10*3/uL (ref 0.0–0.1)
Basophils Relative: 0.5 % (ref 0.0–3.0)
Eosinophils Absolute: 0.2 10*3/uL (ref 0.0–0.7)
Eosinophils Relative: 2.8 % (ref 0.0–5.0)
HCT: 35.7 % — ABNORMAL LOW (ref 36.0–46.0)
Hemoglobin: 11.9 g/dL — ABNORMAL LOW (ref 12.0–15.0)
Lymphocytes Relative: 52.2 % — ABNORMAL HIGH (ref 12.0–46.0)
Lymphs Abs: 3.2 10*3/uL (ref 0.7–4.0)
MCHC: 33.3 g/dL (ref 30.0–36.0)
MCV: 95.3 fl (ref 78.0–100.0)
Monocytes Absolute: 0.5 10*3/uL (ref 0.1–1.0)
Monocytes Relative: 8 % (ref 3.0–12.0)
Neutro Abs: 2.2 10*3/uL (ref 1.4–7.7)
Neutrophils Relative %: 36.5 % — ABNORMAL LOW (ref 43.0–77.0)
Platelets: 305 10*3/uL (ref 150.0–400.0)
RBC: 3.75 Mil/uL — ABNORMAL LOW (ref 3.87–5.11)
RDW: 14.4 % (ref 11.5–15.5)
WBC: 6.1 10*3/uL (ref 4.0–10.5)

## 2022-07-11 LAB — LIPID PANEL
Cholesterol: 198 mg/dL (ref 0–200)
HDL: 52 mg/dL (ref >39.00)
LDL Cholesterol: 126 mg/dL — ABNORMAL HIGH (ref 0–99)
NonHDL: 145.56
Total CHOL/HDL Ratio: 4
Triglycerides: 96 mg/dL (ref 0.0–149.0)
VLDL: 19.2 mg/dL (ref 0.0–40.0)

## 2022-07-11 LAB — HEPATIC FUNCTION PANEL
ALT: 11 U/L (ref 0–35)
AST: 21 U/L (ref 0–37)
Albumin: 4.2 g/dL (ref 3.5–5.2)
Alkaline Phosphatase: 55 U/L (ref 39–117)
Bilirubin, Direct: 0.1 mg/dL (ref 0.0–0.3)
Total Bilirubin: 0.4 mg/dL (ref 0.2–1.2)
Total Protein: 7.6 g/dL (ref 6.0–8.3)

## 2022-07-11 LAB — TSH: TSH: 2.01 u[IU]/mL (ref 0.35–5.50)

## 2022-07-11 LAB — MAGNESIUM: Magnesium: 2.1 mg/dL (ref 1.5–2.5)

## 2022-07-11 LAB — LIPASE: Lipase: 21 U/L (ref 11.0–59.0)

## 2022-07-11 MED ORDER — TRIAMTERENE-HCTZ 37.5-25 MG PO CAPS: 1.0000 | ORAL_CAPSULE | Freq: Every day | ORAL | 0 refills | Status: DC

## 2022-07-11 MED ORDER — ROSUVASTATIN CALCIUM 10 MG PO TABS: 10.0000 mg | ORAL_TABLET | Freq: Every day | ORAL | 1 refills | Status: DC

## 2022-07-13 DIAGNOSIS — D649 Anemia, unspecified: Secondary | ICD-10-CM | POA: Insufficient documentation

## 2022-07-15 ENCOUNTER — Other Ambulatory Visit: Payer: Self-pay | Admitting: Internal Medicine

## 2022-07-15 DIAGNOSIS — F5101 Primary insomnia: Secondary | ICD-10-CM

## 2022-07-17 ENCOUNTER — Other Ambulatory Visit: Payer: Self-pay | Admitting: Internal Medicine

## 2022-07-17 ENCOUNTER — Telehealth: Payer: Self-pay | Admitting: Internal Medicine

## 2022-07-17 DIAGNOSIS — F5101 Primary insomnia: Secondary | ICD-10-CM

## 2022-07-17 NOTE — Telephone Encounter (Signed)
Caller & Relationship to patient: Self   Call back number: [phone]    Date of last office visit: 1.9.24   Date of next office visit: N/A   Medication(s) to be refilled:   Eszopiclone 3 MG TABS    Preferred Pharmacy:    [id number]   Phone: [phone]  Fax: [phone]

## 2022-07-18 ENCOUNTER — Encounter: Payer: Self-pay | Admitting: Internal Medicine

## 2022-07-20 ENCOUNTER — Encounter: Payer: Self-pay | Admitting: Internal Medicine

## 2022-07-20 ENCOUNTER — Ambulatory Visit: Payer: 59 | Admitting: Internal Medicine

## 2022-07-20 VITALS — BP 126/82 | HR 60 | Temp 98.3°F | Ht 66.5 in | Wt 151.0 lb

## 2022-07-20 DIAGNOSIS — R3121 Asymptomatic microscopic hematuria: Secondary | ICD-10-CM

## 2022-07-20 DIAGNOSIS — R10826 Epigastric rebound abdominal tenderness: Secondary | ICD-10-CM

## 2022-07-20 DIAGNOSIS — M47812 Spondylosis without myelopathy or radiculopathy, cervical region: Secondary | ICD-10-CM

## 2022-07-20 DIAGNOSIS — F5101 Primary insomnia: Secondary | ICD-10-CM | POA: Diagnosis not present

## 2022-07-20 DIAGNOSIS — D539 Nutritional anemia, unspecified: Secondary | ICD-10-CM | POA: Insufficient documentation

## 2022-07-20 LAB — URINALYSIS, ROUTINE W REFLEX MICROSCOPIC
Bilirubin Urine: NEGATIVE
Ketones, ur: NEGATIVE
Leukocytes,Ua: NEGATIVE
Nitrite: NEGATIVE
Specific Gravity, Urine: 1.025 (ref 1.000–1.030)
Total Protein, Urine: NEGATIVE
Urine Glucose: NEGATIVE
Urobilinogen, UA: 0.2 (ref 0.0–1.0)
pH: 5.5 (ref 5.0–8.0)

## 2022-07-20 LAB — CBC WITH DIFFERENTIAL/PLATELET
Basophils Absolute: 0 10*3/uL (ref 0.0–0.1)
Basophils Relative: 0.6 % (ref 0.0–3.0)
Eosinophils Absolute: 0.1 10*3/uL (ref 0.0–0.7)
Eosinophils Relative: 2.5 % (ref 0.0–5.0)
HCT: 40.4 % (ref 36.0–46.0)
Hemoglobin: 13.6 g/dL (ref 12.0–15.0)
Lymphocytes Relative: 36 % (ref 12.0–46.0)
Lymphs Abs: 2.1 10*3/uL (ref 0.7–4.0)
MCHC: 33.7 g/dL (ref 30.0–36.0)
MCV: 94.5 fl (ref 78.0–100.0)
Monocytes Absolute: 0.4 10*3/uL (ref 0.1–1.0)
Monocytes Relative: 6.8 % (ref 3.0–12.0)
Neutro Abs: 3.1 10*3/uL (ref 1.4–7.7)
Neutrophils Relative %: 54.1 % (ref 43.0–77.0)
Platelets: 342 10*3/uL (ref 150.0–400.0)
RBC: 4.27 Mil/uL (ref 3.87–5.11)
RDW: 14.6 % (ref 11.5–15.5)
WBC: 5.7 10*3/uL (ref 4.0–10.5)

## 2022-07-20 LAB — IBC + FERRITIN
Ferritin: 412.6 ng/mL — ABNORMAL HIGH (ref 10.0–291.0)
Iron: 105 ug/dL (ref 42–145)
Saturation Ratios: 24 % (ref 20.0–50.0)
TIBC: 436.8 ug/dL (ref 250.0–450.0)
Transferrin: 312 mg/dL (ref 212.0–360.0)

## 2022-07-20 LAB — VITAMIN B12: Vitamin B-12: 643 pg/mL (ref 211–911)

## 2022-07-20 LAB — FOLATE: Folate: 6.4 ng/mL (ref >5.9)

## 2022-07-20 MED ORDER — ESZOPICLONE 3 MG PO TABS: 3.0000 mg | ORAL_TABLET | Freq: Every day | ORAL | 0 refills | Status: DC

## 2022-07-20 MED ORDER — MELOXICAM 7.5 MG PO TABS: 7.5000 mg | ORAL_TABLET | Freq: Every day | ORAL | 0 refills | Status: DC

## 2022-07-20 NOTE — Patient Instructions (Signed)

## 2022-07-20 NOTE — Progress Notes (Signed)
Subjective:  Patient ID: Samantha Reed, female    DOB: 1970-10-04  Age: 52 y.o. MRN: [id number]  CC: Abdominal Pain, Anemia, and Hypertension   HPI Samantha Reed presents for f/up -  She continues to c/o abd pain, weight loss, and palpitations.  Outpatient Medications Prior to Visit  Medication Sig Dispense Refill   Fezolinetant (VEOZAH) 45 MG TABS Take 1 tablet by mouth daily. 90 tablet 1   rosuvastatin (CRESTOR) 10 MG tablet Take 1 tablet (10 mg total) by mouth daily. 90 tablet 1   triamterene-hydrochlorothiazide (DYAZIDE) 37.5-25 MG capsule Take 1 each (1 capsule total) by mouth daily. 90 capsule 0   Eszopiclone 3 MG TABS Take 1 tablet (3 mg total) by mouth at bedtime. Take immediately before bedtime 30 tablet 0   meloxicam (MOBIC) 7.5 MG tablet Take 7.5 mg by mouth daily.     No facility-administered medications prior to visit.    ROS Review of Systems  Constitutional:  Positive for unexpected weight change (wt loss). Negative for chills, diaphoresis and fatigue.  HENT: Negative.    Eyes: Negative.   Respiratory:  Negative for cough, chest tightness, shortness of breath and wheezing.   Cardiovascular:  Positive for palpitations. Negative for chest pain and leg swelling.  Gastrointestinal:  Positive for abdominal pain. Negative for abdominal distention, diarrhea, nausea and vomiting.  Endocrine: Negative.   Genitourinary: Negative.  Negative for difficulty urinating, dysuria and hematuria.  Musculoskeletal:  Positive for neck pain. Negative for arthralgias and myalgias.  Skin:  Negative for color change and pallor.  Neurological: Negative.  Negative for weakness.  Hematological:  Does not bruise/bleed easily.  Psychiatric/Behavioral:  Positive for sleep disturbance. Negative for behavioral problems, dysphoric mood and suicidal ideas.     Objective:  BP 126/82 (BP Location: Left Arm, Patient Position: Sitting, Cuff Size: Large)   Pulse 60   Temp 98.3 F (36.8  C) (Oral)   Ht 5' 6.5" (1.689 m)   Wt 151 lb (68.5 kg)   LMP 11/03/2018   SpO2 96%   BMI 24.01 kg/m   BP Readings from Last 3 Encounters:  07/20/22 126/82  07/10/22 (!) 158/94  03/15/22 108/68    Wt Readings from Last 3 Encounters:  07/20/22 151 lb (68.5 kg)  07/10/22 162 lb (73.5 kg)  03/15/22 152 lb (68.9 kg)    Physical Exam Vitals reviewed. Exam conducted with a chaperone present (Shirron).  Constitutional:      Appearance: She is not ill-appearing.  HENT:     Mouth/Throat:     Mouth: Mucous membranes are moist.  Eyes:     General: No scleral icterus.    Conjunctiva/sclera: Conjunctivae normal.  Cardiovascular:     Rate and Rhythm: Normal rate and regular rhythm.     Pulses: Normal pulses.     Heart sounds: No murmur heard. Abdominal:     General: Abdomen is scaphoid. Bowel sounds are normal.     Palpations: There is no hepatomegaly, splenomegaly or mass.     Tenderness: There is abdominal tenderness in the right upper quadrant and left upper quadrant. There is no guarding or rebound.  Genitourinary:    Rectum: Normal. Guaiac result negative. No mass, tenderness, anal fissure, external hemorrhoid or internal hemorrhoid. Normal anal tone.  Musculoskeletal:        General: Normal range of motion.     Cervical back: Neck supple.     Right lower leg: No edema.     Left lower leg: No edema.  Lymphadenopathy:     Cervical: No cervical adenopathy.  Skin:    General: Skin is warm and dry.  Neurological:     General: No focal deficit present.     Mental Status: She is alert.  Psychiatric:        Mood and Affect: Mood normal.        Behavior: Behavior normal.     Lab Results  Component Value Date   WBC 5.7 07/20/2022   HGB 13.6 07/20/2022   HCT 40.4 07/20/2022   PLT 342.0 07/20/2022   GLUCOSE 88 10/17/2021   CHOL 198 07/10/2022   TRIG 96.0 07/10/2022   HDL 52.00 07/10/2022   LDLCALC 126 (H) 07/10/2022   ALT 11 07/10/2022   AST 21 07/10/2022   NA 137  10/17/2021   K 4.1 10/17/2021   CL 100 10/17/2021   CREATININE 1.13 10/17/2021   BUN 19 10/17/2021   CO2 30 10/17/2021   TSH 2.01 07/10/2022   INR 1.2 04/25/2019    No results found.  Assessment & Plan:   Samantha Reed was seen today for abdominal pain, anemia and hypertension.  Diagnoses and all orders for this visit:  Deficiency anemia- Her H/H are normal now. -     Zinc; Future -     Vitamin B1; Future -     Folate; Future -     CBC with Differential/Platelet; Future -     Vitamin B12; Future -     Reticulocytes; Future -     IBC + Ferritin; Future -     IBC + Ferritin -     Reticulocytes -     Vitamin B12 -     CBC with Differential/Platelet -     Folate -     Vitamin B1 -     Zinc  Primary insomnia -     Urinalysis, Routine w reflex microscopic; Future -     Eszopiclone 3 MG TABS; Take 1 tablet (3 mg total) by mouth at bedtime. Take immediately before bedtime -     Urinalysis, Routine w reflex microscopic  Asymptomatic microscopic hematuria- No blood in the urine today. -     Urinalysis, Routine w reflex microscopic; Future -     Urinalysis, Routine w reflex microscopic  Spondylosis of cervical region without myelopathy or radiculopathy -     meloxicam (MOBIC) 7.5 MG tablet; Take 1 tablet (7.5 mg total) by mouth daily.  Epigastric abdominal tenderness with rebound tenderness- A CT has been ordered.   I have changed Samantha Reed's meloxicam. I am also having her maintain her Veozah, triamterene-hydrochlorothiazide, rosuvastatin, and Eszopiclone.  Meds ordered this encounter  Medications   meloxicam (MOBIC) 7.5 MG tablet    Sig: Take 1 tablet (7.5 mg total) by mouth daily.    Dispense:  90 tablet    Refill:  0   Eszopiclone 3 MG TABS    Sig: Take 1 tablet (3 mg total) by mouth at bedtime. Take immediately before bedtime    Dispense:  90 tablet    Refill:  0     Follow-up: Return in about 3 months (around 10/19/2022).  Samantha Calico, MD

## 2022-07-22 ENCOUNTER — Ambulatory Visit: Payer: 59 | Attending: Internal Medicine

## 2022-07-22 DIAGNOSIS — R002 Palpitations: Secondary | ICD-10-CM

## 2022-07-22 DIAGNOSIS — R001 Bradycardia, unspecified: Secondary | ICD-10-CM

## 2022-07-24 ENCOUNTER — Other Ambulatory Visit: Payer: Self-pay | Admitting: Internal Medicine

## 2022-07-24 DIAGNOSIS — E519 Thiamine deficiency, unspecified: Secondary | ICD-10-CM

## 2022-07-24 LAB — VITAMIN B1: Vitamin B1 (Thiamine): 6 nmol/L — ABNORMAL LOW (ref 8–30)

## 2022-07-24 LAB — RETICULOCYTES
ABS Retic: 82080 cells/uL — ABNORMAL HIGH (ref 20000–80000)
Retic Ct Pct: 1.9 %

## 2022-07-24 LAB — ZINC: Zinc: 78 ug/dL (ref 60–130)

## 2022-07-24 MED ORDER — VITAMIN B-1 50 MG PO TABS: 50.0000 mg | ORAL_TABLET | Freq: Every day | ORAL | 1 refills | Status: DC

## 2022-09-21 ENCOUNTER — Other Ambulatory Visit: Payer: Self-pay | Admitting: Nurse Practitioner

## 2022-09-21 DIAGNOSIS — N951 Menopausal and female climacteric states: Secondary | ICD-10-CM

## 2022-09-24 NOTE — Telephone Encounter (Signed)
Med refill request:Veozah 45 mg tab Last AEX: 03/15/22 /TW Next AEX: Not scheduled  Last MMG (if hormonal med) [facility], 03/22/21 Refill authorized: Please Advise?   Spoke with patient.  Confirmed last MMG at [facility] 2022. Advised patient will need to update MMG, can contact [facility] directly to schedule. Will send request to Tiffany, NP to review. Patient agreeable.

## 2022-10-01 ENCOUNTER — Ambulatory Visit: Payer: 59 | Admitting: Internal Medicine

## 2022-10-01 ENCOUNTER — Ambulatory Visit (INDEPENDENT_AMBULATORY_CARE_PROVIDER_SITE_OTHER): Payer: 59

## 2022-10-01 ENCOUNTER — Encounter: Payer: Self-pay | Admitting: Internal Medicine

## 2022-10-01 VITALS — BP 108/68 | HR 58 | Temp 98.7°F | Ht 66.5 in

## 2022-10-01 DIAGNOSIS — S82831A Other fracture of upper and lower end of right fibula, initial encounter for closed fracture: Secondary | ICD-10-CM | POA: Diagnosis not present

## 2022-10-01 DIAGNOSIS — S82409A Unspecified fracture of shaft of unspecified fibula, initial encounter for closed fracture: Secondary | ICD-10-CM | POA: Insufficient documentation

## 2022-10-01 DIAGNOSIS — M25571 Pain in right ankle and joints of right foot: Secondary | ICD-10-CM

## 2022-10-01 MED ORDER — KETOROLAC TROMETHAMINE 60 MG/2ML IM SOLN
60.0000 mg | Freq: Once | INTRAMUSCULAR | Status: AC
  Administered 2022-10-01: 60 mg via INTRAMUSCULAR

## 2022-10-01 MED ORDER — NABUMETONE 750 MG PO TABS: 750.0000 mg | ORAL_TABLET | Freq: Two times a day (BID) | ORAL | 0 refills | Status: DC | PRN

## 2022-10-01 NOTE — Addendum Note (Signed)
Addended by: Cassandria Anger on: 10/01/2022 11:48 PM   Modules accepted: Orders

## 2022-10-01 NOTE — Addendum Note (Signed)
Addended by: Cassandria Anger on: 10/01/2022 11:43 PM   Modules accepted: Level of Service

## 2022-10-01 NOTE — Assessment & Plan Note (Signed)
Preliminary.  Will refer to [facility].  Use crutches.  Ice.  Elevate leg

## 2022-10-01 NOTE — Progress Notes (Addendum)
Subjective:  Patient ID: Samantha Reed, female    DOB: May 10, 1971  Age: 52 y.o. MRN: [id number]  CC: Ankle Injury (Pt fell and injured her rt ankle)   HPI Samantha Reed presents for R ankle pain  Pt fell yesterday in the afternoon at home. No LOC Can't bear wt on the R foot. Pt slept last night   Outpatient Medications Prior to Visit  Medication Sig Dispense Refill   Eszopiclone 3 MG TABS Take 1 tablet (3 mg total) by mouth at bedtime. Take immediately before bedtime 90 tablet 0   Fezolinetant (VEOZAH) 45 MG TABS Take 1 tablet by mouth daily. 30 tablet 2   meloxicam (MOBIC) 7.5 MG tablet Take 1 tablet (7.5 mg total) by mouth daily. 90 tablet 0   rosuvastatin (CRESTOR) 10 MG tablet Take 1 tablet (10 mg total) by mouth daily. 90 tablet 1   thiamine (VITAMIN B-1) 50 MG tablet Take 1 tablet (50 mg total) by mouth daily. 90 tablet 1   triamterene-hydrochlorothiazide (DYAZIDE) 37.5-25 MG capsule Take 1 each (1 capsule total) by mouth daily. 90 capsule 0   No facility-administered medications prior to visit.    ROS: Review of Systems  Constitutional:  Negative for activity change, appetite change, chills, fatigue and unexpected weight change.  HENT:  Negative for congestion, mouth sores and sinus pressure.   Eyes:  Negative for visual disturbance.  Respiratory:  Negative for cough and chest tightness.   Gastrointestinal:  Negative for abdominal pain and nausea.  Genitourinary:  Negative for difficulty urinating, frequency and vaginal pain.  Musculoskeletal:  Positive for arthralgias and gait problem. Negative for back pain.  Skin:  Negative for pallor and rash.  Neurological:  Negative for dizziness, tremors, weakness, numbness and headaches.  Psychiatric/Behavioral:  Negative for confusion and sleep disturbance.     Objective:  BP 108/68 (BP Location: Right Arm, Patient Position: Sitting, Cuff Size: Large)   Pulse (!) 58   Temp 98.7 F (37.1 C) (Oral)   Ht 5' 6.5"  (1.689 m)   LMP 11/03/2018   SpO2 98%   BMI 24.01 kg/m   BP Readings from Last 3 Encounters:  10/01/22 108/68  07/20/22 126/82  07/10/22 (!) 158/94    Wt Readings from Last 3 Encounters:  07/20/22 151 lb (68.5 kg)  07/10/22 162 lb (73.5 kg)  03/15/22 152 lb (68.9 kg)    Physical Exam Constitutional:      Appearance: Normal appearance.  Musculoskeletal:        General: Tenderness and signs of injury present.     Right lower leg: Edema present.     Left lower leg: No edema.   In a w/c   R lat ankle w/pain and swelling  In a w/c  ACE applied   Lab Results  Component Value Date   WBC 5.7 07/20/2022   HGB 13.6 07/20/2022   HCT 40.4 07/20/2022   PLT 342.0 07/20/2022   GLUCOSE 88 10/17/2021   CHOL 198 07/10/2022   TRIG 96.0 07/10/2022   HDL 52.00 07/10/2022   LDLCALC 126 (H) 07/10/2022   ALT 11 07/10/2022   AST 21 07/10/2022   NA 137 10/17/2021   K 4.1 10/17/2021   CL 100 10/17/2021   CREATININE 1.13 10/17/2021   BUN 19 10/17/2021   CO2 30 10/17/2021   TSH 2.01 07/10/2022   INR 1.2 04/25/2019    No results found.  Assessment & Plan:   Problem List Items Addressed This Visit  Musculoskeletal and Integument   Fibula fracture    Preliminary.  Will refer to [facility].  Use crutches.  Ice.  Elevate leg        Other   Ankle pain, right - Primary    Injured on 09/30/22 R/o fx X ray Toradol 60 mg IM ACE wrap Relafen or Tylenol prn Work fronm home x 1 week      Relevant Orders   DG Ankle Complete Right      Meds ordered this encounter  Medications   nabumetone (RELAFEN) 750 MG tablet    Sig: Take 1 tablet (750 mg total) by mouth 2 (two) times daily as needed.    Dispense:  20 tablet    Refill:  0   ketorolac (TORADOL) injection 60 mg      Follow-up: Return in about 1 week (around 10/08/2022) for f/u with PCP.  Walker Kehr, MD

## 2022-10-01 NOTE — Assessment & Plan Note (Addendum)
Injured on 09/30/22 R/o fx X ray Toradol 60 mg IM ACE wrap Relafen or Tylenol prn Work fronm home x 1 week

## 2022-10-01 NOTE — Addendum Note (Signed)
Addended by: Marijean Heath R on: 10/01/2022 03:52 PM   Modules accepted: Orders

## 2022-10-02 ENCOUNTER — Telehealth: Payer: Self-pay | Admitting: *Deleted

## 2022-10-02 ENCOUNTER — Telehealth: Payer: 59 | Admitting: Internal Medicine

## 2022-10-02 NOTE — Telephone Encounter (Signed)
Called pt no answer LMOM RTC.../lmb 

## 2022-10-02 NOTE — Telephone Encounter (Signed)
-----   Message from Cassandria Anger, MD sent at 10/01/2022 11:46 PM EDT ----- [name], please inform [name] - the x-ray shows a fibula fracture by a preliminary report.  Elevate leg, use ice, use crutches to walk.  Will refer her to see an orthopedist tomorrow at [facility]. Sincerely, AP  Disclaimer: This note was dictated with voice recognition software. Similar sounding words can inadvertently be transcribed and may not be corrected upon review.

## 2022-10-02 NOTE — Telephone Encounter (Signed)
Pt return call back gave her MD response.../lmb 

## 2022-10-05 ENCOUNTER — Encounter (HOSPITAL_BASED_OUTPATIENT_CLINIC_OR_DEPARTMENT_OTHER): Payer: Self-pay | Admitting: Orthopaedic Surgery

## 2022-10-05 ENCOUNTER — Other Ambulatory Visit: Payer: Self-pay | Admitting: Orthopaedic Surgery

## 2022-10-05 ENCOUNTER — Other Ambulatory Visit: Payer: Self-pay

## 2022-10-05 ENCOUNTER — Encounter (HOSPITAL_BASED_OUTPATIENT_CLINIC_OR_DEPARTMENT_OTHER)
Admission: RE | Admit: 2022-10-05 | Discharge: 2022-10-05 | Disposition: A | Discharge status: Home / Self Care | Payer: 59 | Source: Ambulatory Visit | Attending: Orthopaedic Surgery | Admitting: Orthopaedic Surgery

## 2022-10-05 ENCOUNTER — Ambulatory Visit
Admission: RE | Admit: 2022-10-05 | Discharge: 2022-10-05 | Disposition: A | Discharge status: Home / Self Care | Payer: 59 | Source: Ambulatory Visit | Attending: Orthopaedic Surgery | Admitting: Orthopaedic Surgery

## 2022-10-05 DIAGNOSIS — M25571 Pain in right ankle and joints of right foot: Secondary | ICD-10-CM

## 2022-10-05 DIAGNOSIS — Z01812 Encounter for preprocedural laboratory examination: Secondary | ICD-10-CM | POA: Diagnosis present

## 2022-10-05 LAB — BASIC METABOLIC PANEL
Anion gap: 11 (ref 5–15)
BUN: 15 mg/dL (ref 6–20)
CO2: 26 mmol/L (ref 22–32)
Calcium: 9.7 mg/dL (ref 8.9–10.3)
Chloride: 100 mmol/L (ref 98–111)
Creatinine, Ser: 1.15 mg/dL — ABNORMAL HIGH (ref 0.44–1.00)
GFR, Estimated: 57 mL/min — ABNORMAL LOW (ref >60)
Glucose, Bld: 88 mg/dL (ref 70–99)
Potassium: 4 mmol/L (ref 3.5–5.1)
Sodium: 137 mmol/L (ref 135–145)

## 2022-10-05 NOTE — Progress Notes (Signed)
   10/05/22 0919  PAT Phone Screen  Is the patient taking a GLP-1 receptor agonist? No  Do You Have Diabetes? No  Do You Have Hypertension? Yes  Have You Ever Been to the ER for Asthma? No  Have You Taken Oral Steroids in the Past 3 Months? No  Do you Take Phenteramine or any Other Diet Drugs? No  Recent  Lab Work, EKG, CXR? No  Do you have a history of heart problems? No  Have you ever had tests on your heart? Yes  What date/year were cardiac tests completed? Normal Holter monitor results 07/22/22 ( done for bradycardia noted on EKG )  Results viewable: [facility] Media Tab  Any Recent Hospitalizations? No  Height 5' 6.5" (1.689 m)  Weight 70.3 kg  Pat Appointment Scheduled Yes (BMET)

## 2022-10-08 ENCOUNTER — Ambulatory Visit (HOSPITAL_BASED_OUTPATIENT_CLINIC_OR_DEPARTMENT_OTHER): Payer: 59 | Admitting: Certified Registered"

## 2022-10-08 ENCOUNTER — Ambulatory Visit (HOSPITAL_COMMUNITY): Payer: 59

## 2022-10-08 ENCOUNTER — Encounter (HOSPITAL_BASED_OUTPATIENT_CLINIC_OR_DEPARTMENT_OTHER): Payer: Self-pay | Admitting: Orthopaedic Surgery

## 2022-10-08 ENCOUNTER — Encounter (HOSPITAL_BASED_OUTPATIENT_CLINIC_OR_DEPARTMENT_OTHER)
Admission: RE | Disposition: A | Discharge status: Home / Self Care | Payer: Self-pay | Source: Home / Self Care | Attending: Orthopaedic Surgery

## 2022-10-08 ENCOUNTER — Ambulatory Visit (HOSPITAL_BASED_OUTPATIENT_CLINIC_OR_DEPARTMENT_OTHER)
Admission: RE | Admit: 2022-10-08 | Discharge: 2022-10-08 | Disposition: A | Discharge status: Home / Self Care | Payer: 59 | Attending: Orthopaedic Surgery | Admitting: Orthopaedic Surgery

## 2022-10-08 ENCOUNTER — Other Ambulatory Visit: Payer: Self-pay

## 2022-10-08 ENCOUNTER — Other Ambulatory Visit: Payer: Self-pay | Admitting: Internal Medicine

## 2022-10-08 DIAGNOSIS — S82891A Other fracture of right lower leg, initial encounter for closed fracture: Secondary | ICD-10-CM | POA: Insufficient documentation

## 2022-10-08 DIAGNOSIS — S82841A Displaced bimalleolar fracture of right lower leg, initial encounter for closed fracture: Secondary | ICD-10-CM

## 2022-10-08 DIAGNOSIS — I10 Essential (primary) hypertension: Secondary | ICD-10-CM

## 2022-10-08 DIAGNOSIS — X58XXXA Exposure to other specified factors, initial encounter: Secondary | ICD-10-CM | POA: Insufficient documentation

## 2022-10-08 DIAGNOSIS — Z01818 Encounter for other preprocedural examination: Secondary | ICD-10-CM

## 2022-10-08 DIAGNOSIS — Z79899 Other long term (current) drug therapy: Secondary | ICD-10-CM | POA: Insufficient documentation

## 2022-10-08 DIAGNOSIS — G4730 Sleep apnea, unspecified: Secondary | ICD-10-CM | POA: Insufficient documentation

## 2022-10-08 HISTORY — PX: ORIF ANKLE FRACTURE: SHX5408

## 2022-10-08 SURGERY — OPEN REDUCTION INTERNAL FIXATION (ORIF) ANKLE FRACTURE
Anesthesia: General | Site: Ankle | Laterality: Right

## 2022-10-08 MED ORDER — LIDOCAINE HCL (CARDIAC) PF 100 MG/5ML IV SOSY
PREFILLED_SYRINGE | INTRAVENOUS | Status: DC | PRN
  Administered 2022-10-08: 30 mg via INTRAVENOUS

## 2022-10-08 MED ORDER — BUPIVACAINE-EPINEPHRINE (PF) 0.5% -1:200000 IJ SOLN
INTRAMUSCULAR | Status: DC | PRN
  Administered 2022-10-08: 30 mL via PERINEURAL

## 2022-10-08 MED ORDER — CEFAZOLIN SODIUM-DEXTROSE 2-4 GM/100ML-% IV SOLN
INTRAVENOUS | Status: AC
  Filled 2022-10-08: qty 100

## 2022-10-08 MED ORDER — DEXAMETHASONE SODIUM PHOSPHATE 10 MG/ML IJ SOLN
INTRAMUSCULAR | Status: DC | PRN
  Administered 2022-10-08: 4 mg via INTRAVENOUS

## 2022-10-08 MED ORDER — ACETAMINOPHEN 500 MG PO TABS
1000.0000 mg | ORAL_TABLET | Freq: Once | ORAL | Status: AC
  Administered 2022-10-08: 1000 mg via ORAL

## 2022-10-08 MED ORDER — HYDROMORPHONE HCL 1 MG/ML IJ SOLN: 0.2500 mg | INTRAMUSCULAR | Status: DC | PRN

## 2022-10-08 MED ORDER — MEPERIDINE HCL 25 MG/ML IJ SOLN: 6.2500 mg | INTRAMUSCULAR | Status: DC | PRN

## 2022-10-08 MED ORDER — CEFAZOLIN SODIUM-DEXTROSE 2-4 GM/100ML-% IV SOLN
2.0000 g | INTRAVENOUS | Status: AC
  Administered 2022-10-08: 2 g via INTRAVENOUS

## 2022-10-08 MED ORDER — SCOPOLAMINE 1 MG/3DAYS TD PT72
MEDICATED_PATCH | TRANSDERMAL | Status: AC
  Filled 2022-10-08: qty 1

## 2022-10-08 MED ORDER — PROPOFOL 500 MG/50ML IV EMUL
INTRAVENOUS | Status: DC | PRN
  Administered 2022-10-08: 25 ug/kg/min via INTRAVENOUS

## 2022-10-08 MED ORDER — LACTATED RINGERS IV SOLN: INTRAVENOUS | Status: DC

## 2022-10-08 MED ORDER — PROMETHAZINE HCL 25 MG/ML IJ SOLN: 6.2500 mg | INTRAMUSCULAR | Status: DC | PRN

## 2022-10-08 MED ORDER — VANCOMYCIN HCL 500 MG IV SOLR
INTRAVENOUS | Status: DC | PRN
  Administered 2022-10-08: 500 mg via TOPICAL

## 2022-10-08 MED ORDER — MIDAZOLAM HCL 2 MG/2ML IJ SOLN
2.0000 mg | Freq: Once | INTRAMUSCULAR | Status: AC
  Administered 2022-10-08: 2 mg via INTRAVENOUS

## 2022-10-08 MED ORDER — MIDAZOLAM HCL 2 MG/2ML IJ SOLN: 0.5000 mg | Freq: Once | INTRAMUSCULAR | Status: DC | PRN

## 2022-10-08 MED ORDER — ROPIVACAINE HCL 7.5 MG/ML IJ SOLN
INTRAMUSCULAR | Status: DC | PRN
  Administered 2022-10-08: 20 mL via PERINEURAL

## 2022-10-08 MED ORDER — OXYCODONE HCL 5 MG/5ML PO SOLN: 5.0000 mg | Freq: Once | ORAL | Status: DC | PRN

## 2022-10-08 MED ORDER — PROPOFOL 10 MG/ML IV BOLUS
INTRAVENOUS | Status: DC | PRN
  Administered 2022-10-08: 200 mg via INTRAVENOUS

## 2022-10-08 MED ORDER — FENTANYL CITRATE (PF) 100 MCG/2ML IJ SOLN
INTRAMUSCULAR | Status: AC
  Filled 2022-10-08: qty 2

## 2022-10-08 MED ORDER — ONDANSETRON HCL 4 MG/2ML IJ SOLN
INTRAMUSCULAR | Status: DC | PRN
  Administered 2022-10-08: 4 mg via INTRAVENOUS

## 2022-10-08 MED ORDER — OXYCODONE HCL 5 MG PO TABS: 5.0000 mg | ORAL_TABLET | Freq: Once | ORAL | Status: DC | PRN

## 2022-10-08 MED ORDER — SCOPOLAMINE 1 MG/3DAYS TD PT72
1.0000 | MEDICATED_PATCH | TRANSDERMAL | Status: DC
  Administered 2022-10-08: 1.5 mg via TRANSDERMAL

## 2022-10-08 MED ORDER — FENTANYL CITRATE (PF) 100 MCG/2ML IJ SOLN
100.0000 ug | Freq: Once | INTRAMUSCULAR | Status: AC
  Administered 2022-10-08: 100 ug via INTRAVENOUS

## 2022-10-08 MED ORDER — ACETAMINOPHEN 500 MG PO TABS
ORAL_TABLET | ORAL | Status: AC
  Filled 2022-10-08: qty 2

## 2022-10-08 MED ORDER — MIDAZOLAM HCL 2 MG/2ML IJ SOLN
INTRAMUSCULAR | Status: AC
  Filled 2022-10-08: qty 2

## 2022-10-08 MED ORDER — POVIDONE-IODINE 10 % EX SOLN
CUTANEOUS | Status: DC | PRN
  Administered 2022-10-08: 1 via TOPICAL

## 2022-10-08 SURGICAL SUPPLY — 82 items
APL PRP STRL LF DISP 70% ISPRP (MISCELLANEOUS) ×1
BANDAGE ESMARK 6X9 LF (GAUZE/BANDAGES/DRESSINGS) ×1 IMPLANT
BIT DRILL 2.5X2.75 QC CALB (BIT) IMPLANT
BIT DRILL CALIBRATED 2.7 (BIT) IMPLANT
BLADE SURG 15 STRL LF DISP TIS (BLADE) ×4 IMPLANT
BLADE SURG 15 STRL SS (BLADE) ×4
BNDG CMPR 5X4 CHSV STRCH STRL (GAUZE/BANDAGES/DRESSINGS) ×1
BNDG CMPR 5X62 HK CLSR LF (GAUZE/BANDAGES/DRESSINGS) ×1
BNDG CMPR 6"X 5 YARDS HK CLSR (GAUZE/BANDAGES/DRESSINGS) ×1
BNDG CMPR 9X6 STRL LF SNTH (GAUZE/BANDAGES/DRESSINGS) ×1
BNDG COHESIVE 4X5 TAN STRL LF (GAUZE/BANDAGES/DRESSINGS) ×1 IMPLANT
BNDG ELASTIC 4X5.8 VLCR STR LF (GAUZE/BANDAGES/DRESSINGS) ×2 IMPLANT
BNDG ELASTIC 6INX 5YD STR LF (GAUZE/BANDAGES/DRESSINGS) ×1 IMPLANT
BNDG ESMARK 6X9 LF (GAUZE/BANDAGES/DRESSINGS) ×1
BNDG GAUZE DERMACEA FLUFF 4 (GAUZE/BANDAGES/DRESSINGS) ×1 IMPLANT
BNDG GZE DERMACEA 4 6PLY (GAUZE/BANDAGES/DRESSINGS) ×1
BRUSH SCRUB EZ  4% CHG (MISCELLANEOUS)
BRUSH SCRUB EZ 4% CHG (MISCELLANEOUS) ×1 IMPLANT
CANISTER SUCT 1200ML W/VALVE (MISCELLANEOUS) ×1 IMPLANT
CHLORAPREP W/TINT 26 (MISCELLANEOUS) ×2 IMPLANT
COVER BACK TABLE 60X90IN (DRAPES) ×1 IMPLANT
CUFF TOURN SGL QUICK 34 (TOURNIQUET CUFF) ×1
CUFF TRNQT CYL 34X4.125X (TOURNIQUET CUFF) ×1 IMPLANT
DRAPE C-ARM 42X72 X-RAY (DRAPES) ×1 IMPLANT
DRAPE C-ARMOR (DRAPES) ×1 IMPLANT
DRAPE EXTREMITY T 121X128X90 (DISPOSABLE) ×1 IMPLANT
DRAPE IMP U-DRAPE 54X76 (DRAPES) ×1 IMPLANT
DRAPE U-SHAPE 47X51 STRL (DRAPES) ×1 IMPLANT
DRSG MEPITEL 4X7.2 (GAUZE/BANDAGES/DRESSINGS) ×1 IMPLANT
ELECT REM PT RETURN 9FT ADLT (ELECTROSURGICAL) ×1
ELECTRODE REM PT RTRN 9FT ADLT (ELECTROSURGICAL) ×1 IMPLANT
FIXATION ZIPTIGHT ANKLE SNDSMS (Ankle) IMPLANT
GAUZE PAD ABD 8X10 STRL (GAUZE/BANDAGES/DRESSINGS) ×5 IMPLANT
GAUZE SPONGE 4X4 12PLY STRL (GAUZE/BANDAGES/DRESSINGS) ×1 IMPLANT
GLOVE BIOGEL PI IND STRL 8 (GLOVE) ×1 IMPLANT
GLOVE SURG SS PI 7.5 STRL IVOR (GLOVE) ×2 IMPLANT
GOWN STRL REUS W/ TWL LRG LVL3 (GOWN DISPOSABLE) ×2 IMPLANT
GOWN STRL REUS W/TWL LRG LVL3 (GOWN DISPOSABLE) ×1
K-WIRE ACE 1.6X6 (WIRE) ×3
KWIRE ACE 1.6X6 (WIRE) IMPLANT
MARKER SKIN DUAL TIP RULER LAB (MISCELLANEOUS) IMPLANT
NDL HYPO 25X1 1.5 SAFETY (NEEDLE) IMPLANT
NEEDLE HYPO 25X1 1.5 SAFETY (NEEDLE) IMPLANT
NS IRRIG 1000ML POUR BTL (IV SOLUTION) ×1 IMPLANT
PACK BASIN DAY SURGERY FS (CUSTOM PROCEDURE TRAY) ×1 IMPLANT
PAD CAST 4YDX4 CTTN HI CHSV (CAST SUPPLIES) ×1 IMPLANT
PADDING CAST ABS COTTON 4X4 ST (CAST SUPPLIES) IMPLANT
PADDING CAST COTTON 4X4 STRL (CAST SUPPLIES) ×1
PADDING CAST SYNTHETIC 4X4 STR (CAST SUPPLIES) ×2 IMPLANT
PADDING CAST SYNTHETIC 6X4 NS (CAST SUPPLIES) ×2 IMPLANT
PENCIL SMOKE EVACUATOR (MISCELLANEOUS) ×1 IMPLANT
PLATE LOCK 7H 92 BILAT FIB (Plate) IMPLANT
SCREW LOCK 3.5X12 DIST TIB (Screw) IMPLANT
SCREW LOCK 3.5X14 DIST TIB (Screw) IMPLANT
SCREW LOCK CORT STAR 3.5X12 (Screw) IMPLANT
SCREW LOCK CORT STAR 3.5X14 (Screw) IMPLANT
SCREW NON LOCKING LP 3.5 14MM (Screw) IMPLANT
SHEET MEDIUM DRAPE 40X70 STRL (DRAPES) ×1 IMPLANT
SLEEVE SCD COMPRESS KNEE MED (STOCKING) ×1 IMPLANT
SPIKE FLUID TRANSFER (MISCELLANEOUS) IMPLANT
SPLINT PLASTER CAST FAST 5X30 (CAST SUPPLIES) ×20 IMPLANT
SPONGE T-LAP 18X18 ~~LOC~~+RFID (SPONGE) ×1 IMPLANT
STOCKINETTE 6  STRL (DRAPES) ×1
STOCKINETTE 6 STRL (DRAPES) ×1 IMPLANT
STOCKINETTE ORTHO 6X25 (MISCELLANEOUS) ×1 IMPLANT
SUCTION FRAZIER HANDLE 10FR (MISCELLANEOUS) ×1
SUCTION TUBE FRAZIER 10FR DISP (MISCELLANEOUS) IMPLANT
SUT ETHILON 2 0 FS 18 (SUTURE) ×2 IMPLANT
SUT MNCRL AB 3-0 PS2 18 (SUTURE) ×1 IMPLANT
SUT PDS 3-0 CT2 (SUTURE)
SUT PDS II 3-0 CT2 27 ABS (SUTURE) IMPLANT
SUT VIC AB 2-0 SH 27 (SUTURE) ×1
SUT VIC AB 2-0 SH 27XBRD (SUTURE) IMPLANT
SUT VIC AB 3-0 SH 27 (SUTURE)
SUT VIC AB 3-0 SH 27X BRD (SUTURE) IMPLANT
SUT VICRYL 0 SH 27 (SUTURE) IMPLANT
SYR BULB IRRIG 60ML STRL (SYRINGE) ×1 IMPLANT
SYR CONTROL 10ML LL (SYRINGE) IMPLANT
TOWEL GREEN STERILE FF (TOWEL DISPOSABLE) ×2 IMPLANT
TUBE CONNECTING 20X1/4 (TUBING) ×1 IMPLANT
UNDERPAD 30X36 HEAVY ABSORB (UNDERPADS AND DIAPERS) ×1 IMPLANT
ZIPTIGHT ANKLE SYNODESMOSS FIX (Ankle) ×1 IMPLANT

## 2022-10-08 NOTE — Anesthesia Postprocedure Evaluation (Signed)
Anesthesia Post Note  Patient: Samantha Reed  Procedure(s) Performed: OPEN REDUCTION INTERNAL FIXATION  RIGHT ANKLE, POSSIBLE SYNDESMOSIS AND OR DELTOID FIXATION POSSIBLE ALLOGRAFT (Right: Ankle)     Patient location during evaluation: PACU Anesthesia Type: General and Regional Level of consciousness: awake and alert, patient cooperative and oriented Pain management: pain level controlled Vital Signs Assessment: post-procedure vital signs reviewed and stable Respiratory status: spontaneous breathing, nonlabored ventilation and respiratory function stable Cardiovascular status: blood pressure returned to baseline and stable Postop Assessment: no apparent nausea or vomiting and adequate PO intake Anesthetic complications: no   No notable events documented.  Last Vitals:  Vitals:   10/08/22 1030 10/08/22 1057  BP: 101/69 123/63  Pulse: 63 67  Resp: 12 20  Temp:  (!) 36.3 C  SpO2: 100% 94%    Last Pain:  Vitals:   10/08/22 1057  TempSrc: Temporal  PainSc: 0-No pain                 Sharilyn Geisinger,E. Mikhail Hallenbeck

## 2022-10-08 NOTE — Anesthesia Procedure Notes (Signed)
Anesthesia Regional Block: Popliteal block   Pre-Anesthetic Checklist: , timeout performed,  Correct Patient, Correct Site, Correct Laterality,  Correct Procedure, Correct Position, site marked,  Risks and benefits discussed,  Surgical consent,  Pre-op evaluation,  At surgeon's request and post-op pain management  Laterality: Right and Lower  Prep: chloraprep       Needles:  Injection technique: Single-shot  Needle Type: Echogenic Needle     Needle Length: 9cm  Needle Gauge: 21     Additional Needles:   Procedures:,,,, ultrasound used (permanent image in chart),,    Narrative:  Start time: 10/08/2022 8:08 AM End time: 10/08/2022 8:10 AM Injection made incrementally with aspirations every 5 mL.  Performed by: Personally  Anesthesiologist: Jairo Ben, MD  Additional Notes: Pt identified in Holding room.  Monitors applied. Working IV access confirmed. Sterile prep R lateral knee/distal thigh.  #21ga ECHOgenic Arrow block needle to sciatic nerve at split in pop fossa with US guidance.  30cc 0.5% Bupivacaine 1:200k epi injected incrementally after negative test dose.  Patient asymptomatic, VSS, no heme aspirated, tolerated well.   Sandford Craze, MD

## 2022-10-08 NOTE — Anesthesia Preprocedure Evaluation (Addendum)
Anesthesia Evaluation  Patient identified by MRN, date of birth, ID band Patient awake    Reviewed: Allergy & Precautions, NPO status , Patient's Chart, lab work & pertinent test results  History of Anesthesia Complications Negative for: history of anesthetic complications  Airway Mallampati: I  TM Distance: >3 FB Neck ROM: Full    Dental  (+) Dental Advisory Given   Pulmonary sleep apnea (does not use CPAP) , Current Smoker and Patient abstained from smoking.   breath sounds clear to auscultation       Cardiovascular hypertension, Pt. on medications  Rhythm:Regular Rate:Normal     Neuro/Psych negative neurological ROS     GI/Hepatic negative GI ROS,,,(+)     substance abuse  marijuana use  Endo/Other  negative endocrine ROS    Renal/GU negative Renal ROS     Musculoskeletal   Abdominal   Peds  Hematology negative hematology ROS (+)   Anesthesia Other Findings   Reproductive/Obstetrics                             Anesthesia Physical Anesthesia Plan  ASA: 2  Anesthesia Plan: General   Post-op Pain Management: Tylenol PO (pre-op)* and Regional block*   Induction: Intravenous  PONV Risk Score and Plan: 3 and Ondansetron, Dexamethasone and Scopolamine patch - Pre-op  Airway Management Planned: LMA  Additional Equipment: None  Intra-op Plan:   Post-operative Plan:   Informed Consent: I have reviewed the patients History and Physical, chart, labs and discussed the procedure including the risks, benefits and alternatives for the proposed anesthesia with the patient or authorized representative who has indicated his/her understanding and acceptance.     Dental advisory given  Plan Discussed with: CRNA and Surgeon  Anesthesia Plan Comments: (Plan routine monitors, GA with adductor canal and popliteal blocks for post op analgesia)        Anesthesia Quick Evaluation

## 2022-10-08 NOTE — Progress Notes (Signed)
Assisted Dr. Jairo Ben with right, adductor canal, popliteal, ultrasound guided block. Side rails up, monitors on throughout procedure. See vital signs in flow sheet. Tolerated Procedure well.

## 2022-10-08 NOTE — Anesthesia Procedure Notes (Signed)
Anesthesia Regional Block: Adductor canal block   Pre-Anesthetic Checklist: , timeout performed,  Correct Patient, Correct Site, Correct Laterality,  Correct Procedure, Correct Position, site marked,  Risks and benefits discussed,  Surgical consent,  Pre-op evaluation,  At surgeon's request and post-op pain management  Laterality: Right and Lower  Prep: chloraprep       Needles:  Injection technique: Single-shot  Needle Type: Echogenic Needle     Needle Length: 9cm  Needle Gauge: 21     Additional Needles:   Procedures:,,,, ultrasound used (permanent image in chart),,    Narrative:  Start time: 10/08/2022 8:01 AM End time: 10/08/2022 8:07 AM Injection made incrementally with aspirations every 5 mL.  Performed by: Personally  Anesthesiologist: Jairo Ben, MD  Additional Notes: Pt identified in Holding room.  Monitors applied. Working IV access confirmed. Sterile prep R thigh.  #21ga ECHOgenic Arrow block needle into adductor canal with US guidance.  20cc 0.75% Ropivacaine injected incrementally after negative test dose.  Patient asymptomatic, VSS, no heme aspirated, tolerated well.   Sandford Craze, MD

## 2022-10-08 NOTE — H&P (Signed)
ORTHOPAEDIC SURGERY H&P  Subjective:  The patient presents with right ankle fx.   Past Medical History:  Diagnosis Date   Anemia    Fibroid    High cholesterol    Hypertension    Sleep apnea    no cpap mild    Past Surgical History:  Procedure Laterality Date   BREAST SURGERY     LUMPECTOMY FROM BREAST ? WHICH BREAST    HYSTERECTOMY ABDOMINAL WITH SALPINGECTOMY Bilateral 04/13/2019   Procedure: HYSTERECTOMY ABDOMINAL WITH SALPINGECTOMY;  Surgeon: Genia Del, MD;  Location: [facility];  Service: Gynecology;  Laterality: Bilateral;  request 7:30am OR Time in [place] Gyn block requests 2 hours OR time   IR RADIOLOGIST EVAL & MGMT  05/12/2019   IR SINUS/FIST TUBE CHK-NON GI  04/30/2019   TUBAL LIGATION       (Not in an outpatient encounter)    Allergies  Allergen Reactions   Tramadol Itching   Vicodin [Hydrocodone-Acetaminophen] Itching    Social History   Socioeconomic History   Marital status: Divorced    Spouse name: Not on file   Number of children: Not on file   Years of education: Not on file   Highest education level: Not on file  Occupational History   Not on file  Tobacco Use   Smoking status: Former    Packs/day: 1.00    Years: 20.00    Additional pack years: 0.00    Total pack years: 20.00    Types: Cigarettes    Quit date: 09/2021    Years since quitting: 1.0    Passive exposure: Current   Smokeless tobacco: Never  Vaping Use   Vaping Use: Never used  Substance and Sexual Activity   Alcohol use: Yes    Alcohol/week: 7.0 standard drinks of alcohol    Types: 7 Glasses of wine per week    Comment: occasional   Drug use: Yes    Types: Marijuana    Comment: 1-2x/week-infusion   Sexual activity: Yes    Birth control/protection: Surgical, Condom    Comment: intercourse age 42, more than 5v sexual partners  Other Topics Concern   Not on file  Social History Narrative   Not on file   Social Determinants of Health    Financial Resource Strain: Not on file  Food Insecurity: Not on file  Transportation Needs: Not on file  Physical Activity: Not on file  Stress: Not on file  Social Connections: Not on file  Intimate Partner Violence: Not on file     History reviewed. No pertinent family history.   Review of Systems Pertinent items are noted in HPI.  Objective: Vital signs in last 24 hours:    10/08/2022    6:51 AM 10/05/2022    9:19 AM 10/01/2022    2:10 PM  Vitals with BMI  Height 5' 6.5" 5' 6.5" 5' 6.5"  Weight 149 lbs 8 oz 155 lbs   BMI 23.77 24.65   Systolic 114  108  Diastolic 73  68  Pulse 60  58      EXAM: General: Well nourished, well developed. Awake, alert and oriented to time, place, person. Normal mood and affect. No apparent distress. Breathing room air.  Operative Lower Extremity: Alignment - Neutral Deformity - None Skin intact Tenderness to palpation - right ankle 5/5 TA, PT, GS, Per, EHL, FHL Sensation intact to light touch throughout Palpable DP and PT pulses Special testing: None  The contralateral foot/ankle was examined for  comparison and noted to be neurovascularly intact with no localized deformity, swelling, or tenderness.  Imaging Review All images taken were independently reviewed by me.  Assessment/Plan: The clinical and radiographic findings were reviewed and discussed at length with the patient.  The patient has right ankle fx.  We spoke at length about the natural course of these findings. We discussed nonoperative and operative treatment options in detail.  The risks and benefits were presented and reviewed. The risks due to implant failure/irritation, infection, stiffness, nerve/vessel/tendon injury, wound healing issues, failure of this surgery, need for further surgery, thromboembolic events, amputation, death among others were discussed. The patient acknowledged the explanation and agreed to proceed with the plan.  Samantha Reed   Orthopaedic Surgery [facility]

## 2022-10-08 NOTE — Anesthesia Procedure Notes (Signed)
Procedure Name: LMA Insertion Date/Time: 10/08/2022 8:36 AM  Performed by: Ayanah Snader, Jewel Baize, CRNAPre-anesthesia Checklist: Patient identified, Emergency Drugs available, Suction available and Patient being monitored Patient Re-evaluated:Patient Re-evaluated prior to induction Oxygen Delivery Method: Circle system utilized Preoxygenation: Pre-oxygenation with 100% oxygen Induction Type: IV induction Ventilation: Mask ventilation without difficulty LMA: LMA inserted LMA Size: 4.0 Number of attempts: 1 Airway Equipment and Method: Bite block Placement Confirmation: positive ETCO2 Tube secured with: Tape Dental Injury: Teeth and Oropharynx as per pre-operative assessment

## 2022-10-08 NOTE — Transfer of Care (Signed)
Immediate Anesthesia Transfer of Care Note  Patient: Tyanah Tietjen  Procedure(s) Performed: OPEN REDUCTION INTERNAL FIXATION  RIGHT ANKLE, POSSIBLE SYNDESMOSIS AND OR DELTOID FIXATION POSSIBLE ALLOGRAFT (Right: Ankle)  Patient Location: PACU  Anesthesia Type:GA combined with regional for post-op pain  Level of Consciousness: drowsy and patient cooperative  Airway & Oxygen Therapy: Patient Spontanous Breathing and Patient connected to face mask oxygen  Post-op Assessment: Report given to RN and Post -op Vital signs reviewed and stable  Post vital signs: Reviewed and stable  Last Vitals:  Vitals Value Taken Time  BP 102/65 10/08/22 0952  Temp    Pulse 88 10/08/22 0954  Resp 20 10/08/22 0954  SpO2 99 % 10/08/22 0954  Vitals shown include unvalidated device data.  Last Pain:  Vitals:   10/08/22 0651  TempSrc: Oral  PainSc: 4       Patients Stated Pain Goal: 3 (10/08/22 8592)  Complications: No notable events documented.

## 2022-10-08 NOTE — Discharge Instructions (Addendum)
No Tylenol until after 1:20pm today, if needed.  Post Anesthesia Home Care Instructions  Activity: Get plenty of rest for the remainder of the day. A responsible individual must stay with you for 24 hours following the procedure.  For the next 24 hours, DO NOT: -Drive a car -Advertising copywriter -Drink alcoholic beverages -Take any medication unless instructed by your physician -Make any legal decisions or sign important papers.  Meals: Start with liquid foods such as gelatin or soup. Progress to regular foods as tolerated. Avoid greasy, spicy, heavy foods. If nausea and/or vomiting occur, drink only clear liquids until the nausea and/or vomiting subsides. Call your physician if vomiting continues.  Special Instructions/Symptoms: Your throat may feel dry or sore from the anesthesia or the breathing tube placed in your throat during surgery. If this causes discomfort, gargle with warm salt water. The discomfort should disappear within 24 hours.  If you had a scopolamine patch placed behind your ear for the management of post- operative nausea and/or vomiting:  1. The medication in the patch is effective for 72 hours, after which it should be removed.  Wrap patch in a tissue and discard in the trash. Wash hands thoroughly with soap and water. 2. You may remove the patch earlier than 72 hours if you experience unpleasant side effects which may include dry mouth, dizziness or visual disturbances. 3. Avoid touching the patch. Wash your hands with soap and water after contact with the patch.     Regional Anesthesia Blocks  1. Numbness or the inability to move the "blocked" extremity may last from 3-48 hours after placement. The length of time depends on the medication injected and your individual response to the medication. If the numbness is not going away after 48 hours, call your surgeon.  2. The extremity that is blocked will need to be protected until the numbness is gone and the  Strength  has returned. Because you cannot feel it, you will need to take extra care to avoid injury. Because it may be weak, you may have difficulty moving it or using it. You may not know what position it is in without looking at it while the block is in effect.  3. For blocks in the legs and feet, returning to weight bearing and walking needs to be done carefully. You will need to wait until the numbness is entirely gone and the strength has returned. You should be able to move your leg and foot normally before you try and bear weight or walk. You will need someone to be with you when you first try to ensure you do not fall and possibly risk injury.  4. Bruising and tenderness at the needle site are common side effects and will resolve in a few days.  5. Persistent numbness or new problems with movement should be communicated to the surgeon or the [facility] [phone] [facility] [phone]).   Non weight bearing Return to office Friday at 0850 at [facility]  Take medication as ordered  Keep dressing on and dry  ========================================== Netta Cedars, MD [facility]  Please read the following information regarding your care after surgery.  Medications  You only need a prescription for the narcotic pain medicine (ex. oxycodone, Percocet, Norco).  All of the other medicines listed below are available over the counter. ? Aleve 2 pills twice a day for the first 3 days after surgery. ? acetominophen (Tylenol) 650 mg every 4-6 hours as you need for  minor to moderate pain ? oxycodone as prescribed for severe pain  ? To help prevent blood clots, take aspirin (81 mg) twice daily for 42 days after surgery (or total duration of nonweightbearing).  You should also get up every hour while you are awake to move around.  Weight Bearing ? Do NOT bear any weight on the operated leg or foot. This means do NOT touch your surgical leg to the  ground!  Cast / Splint / Dressing ? If you have a splint, do NOT remove this. Keep your splint, cast or dressing clean and dry.  Don't put anything (coat hanger, pencil, etc) down inside of it.  If it gets wet, call the office immediately to schedule an appointment for a cast change.  Swelling IMPORTANT: It is normal for you to have swelling where you had surgery. To reduce swelling and pain, keep at least 3 pillows under your leg so that your toes are above your nose and your heel is above the level of your hip.  It may be necessary to keep your foot or leg elevated for several weeks.  This is critical to helping your incisions heal and your pain to feel better.  Follow Up Call my office at [phone] when you are discharged from the hospital or surgery center to schedule an appointment to be seen 7-10 days after surgery.  Call my office at [phone] if you develop a fever >101.5 F, nausea, vomiting, bleeding from the surgical site or severe pain.

## 2022-10-08 NOTE — H&P (Signed)
H&P Update:  -History and Physical Reviewed  -Patient has been re-examined  -No change in the plan of care  -The risks and benefits were presented and reviewed. The risks due to hardware/suture failure and/or irritation, new/persistent infection, stiffness, nerve/vessel/tendon injury or rerupture of repaired tendon, nonunion/malunion, allograft usage, wound healing issues, development of arthritis, failure of this surgery, possibility of external fixation with delayed definitive surgery, need for further surgery, thromboembolic events, anesthesia/medical complications, amputation, death among others were discussed. The patient acknowledged the explanation, agreed to proceed with the plan and a consent was signed.  Ellesse Antenucci  

## 2022-10-08 NOTE — Op Note (Addendum)
10/08/2022  11:08 AM   PATIENT: Samantha Reed  52 y.o. female  MRN: [id number]   PRE-OPERATIVE DIAGNOSIS:   Displaced bimalleolar equivalent fracture of right ankle   POST-OPERATIVE DIAGNOSIS:   Same   PROCEDURE: 1] Open reduction internal fixation of right distal fibula 2] Open reduction internal fixation of right ankle syndesmosis   SURGEON:  Netta Cedars, MD   ASSISTANT: None   ANESTHESIA: General, regional   EBL: Minimal   TOURNIQUET:    Total Tourniquet Time Documented: Thigh (Right) - 50 minutes Total: Thigh (Right) - 50 minutes    COMPLICATIONS: None apparent   DISPOSITION: Extubated, awake and stable to recovery.   INDICATION FOR PROCEDURE: The patient presented with above diagnosis.  We discussed the diagnosis, alternative treatment options, risks and benefits of the above surgical intervention, as well as alternative non-operative treatments. All questions/concerns were addressed and the patient/family demonstrated appropriate understanding of the diagnosis, the procedure, the postoperative course, and overall prognosis. The patient wished to proceed with surgical intervention and signed an informed surgical consent as such, in each others presence prior to surgery.   PROCEDURE IN DETAIL: After preoperative consent was obtained and the correct operative site was identified, the patient was brought to the operating room supine on stretcher and transferred onto operating table. General anesthesia was induced. Preoperative antibiotics were administered. Surgical timeout was taken. The patient was then positioned supine with an ipsilateral hip bump. The operative lower extremity was prepped and draped in standard sterile fashion with a tourniquet around the thigh. The extremity was exsanguinated and the tourniquet was inflated to 275 mmHg.  A standard lateral incision was made over the distal fibula. Dissection was carried down to the level of  the fibula and the fracture site identified. The superficial peroneal nerve was identified and protected throughout the procedure. The fibula was noted to be shortened with interposed periosteum. The fibula was brought out to length. The fibula fracture was debrided and the edges defined to achieve cortical read. Reduction maneuver was performed using pointed reduction forceps and lobster forceps. In this manner, the fibula length was restored and fracture reduced. A lag screw was not placed given the orientation of fracture lines and comminution. Due to poor bone quality and extensive comminution at the fracture site, it was decided to use a locking distal fibula plate. We then selected a Zimmer locking plate to match the anatomy of the distal fibula and placed it laterally. This was implanted under intraoperative fluoroscopy with a combination of distal locking screws and proximal cortical & locking screws.  A manual external rotation stress radiograph was obtained and demonstrated widening of the ankle mortise. Given this intraoperative finding as well as preoperative subluxation, it was decided to reduce and fix the syndesmosis. Therefore a suture fixation system (ZipTight device) was implanted through the fibula plate in cannulated fashion to fix the syndesmosis. Anchor/button position was verified along anteromedial tibial cortex by fluoroscopy. A repeat stress radiograph showed complete stability of the ankle mortise to testing.  The surgical sites were thoroughly irrigated. The tourniquet was deflated and hemostasis achieved. The deep layers were closed using 2-0 vicryl. The skin was closed without tension using 2-0 nylon suture.    The leg was cleaned with saline and sterile dressings with gauze were applied. A well padded bulky short leg splint was applied. The patient was awakened from anesthesia and transported to the recovery room in stable condition.    FOLLOW UP PLAN: -transfer to PACU, then  home -strict NWB operative extremity, maximum elevation -maintain short leg splint until follow up -DVT ppx: Aspirin 81 mg twice daily while NWB -follow up as outpatient within 7-10 days for wound check with exchange of short leg splint to short leg cast -sutures out in 2-3 weeks in outpatient office   RADIOGRAPHS: AP, lateral, oblique and stress radiographs of the right ankle were obtained intraoperatively. These showed interval reduction and fixation of the fractures. Manual stress radiographs were taken and the ankle mortise and tibiofibular relationship were noted to be stable following fixation. All hardware is appropriately positioned and of the appropriate lengths. No other acute injuries are noted.   Netta Cedars Orthopaedic Surgery [facility]

## 2022-10-09 ENCOUNTER — Encounter (HOSPITAL_BASED_OUTPATIENT_CLINIC_OR_DEPARTMENT_OTHER): Payer: Self-pay | Admitting: Orthopaedic Surgery

## 2022-11-06 ENCOUNTER — Other Ambulatory Visit: Payer: Self-pay | Admitting: Internal Medicine

## 2022-11-06 DIAGNOSIS — F5101 Primary insomnia: Secondary | ICD-10-CM

## 2022-11-07 ENCOUNTER — Other Ambulatory Visit: Payer: Self-pay | Admitting: Internal Medicine

## 2022-11-07 DIAGNOSIS — M47812 Spondylosis without myelopathy or radiculopathy, cervical region: Secondary | ICD-10-CM

## 2022-11-08 MED ORDER — MELOXICAM 7.5 MG PO TABS: 7.5000 mg | ORAL_TABLET | Freq: Every day | ORAL | 0 refills | Status: DC

## 2022-12-26 ENCOUNTER — Other Ambulatory Visit: Payer: Self-pay | Admitting: Nurse Practitioner

## 2022-12-26 DIAGNOSIS — N951 Menopausal and female climacteric states: Secondary | ICD-10-CM

## 2022-12-26 NOTE — Telephone Encounter (Signed)
Med refill request: Veozah Last AEX: 03/15/22 w TW Next AEX: not scheduled Last MMG (if hormonal med) 03/22/21 Refill authorized: Please Advise, #30, 1 RF

## 2023-01-07 LAB — HM MAMMOGRAPHY

## 2023-01-08 ENCOUNTER — Encounter: Payer: Self-pay | Admitting: Internal Medicine

## 2023-01-08 ENCOUNTER — Other Ambulatory Visit: Payer: Self-pay | Admitting: Internal Medicine

## 2023-01-08 DIAGNOSIS — I10 Essential (primary) hypertension: Secondary | ICD-10-CM

## 2023-01-08 DIAGNOSIS — E785 Hyperlipidemia, unspecified: Secondary | ICD-10-CM

## 2023-01-15 ENCOUNTER — Other Ambulatory Visit: Payer: Self-pay | Admitting: Internal Medicine

## 2023-01-15 DIAGNOSIS — Z720 Tobacco use: Secondary | ICD-10-CM

## 2023-02-06 ENCOUNTER — Encounter: Payer: Self-pay | Admitting: Internal Medicine

## 2023-03-11 ENCOUNTER — Other Ambulatory Visit: Payer: Self-pay | Admitting: Internal Medicine

## 2023-03-11 DIAGNOSIS — Z720 Tobacco use: Secondary | ICD-10-CM

## 2023-03-11 DIAGNOSIS — F5101 Primary insomnia: Secondary | ICD-10-CM

## 2023-03-13 ENCOUNTER — Other Ambulatory Visit: Payer: Self-pay | Admitting: Obstetrics & Gynecology

## 2023-03-13 ENCOUNTER — Encounter: Payer: Self-pay | Admitting: Internal Medicine

## 2023-03-13 DIAGNOSIS — N951 Menopausal and female climacteric states: Secondary | ICD-10-CM

## 2023-03-14 NOTE — Telephone Encounter (Signed)
Med refill request: Veozah 45 mg tab PO daily Last AEX: 03/15/22 -TW Next AEX: Not Scheduled, MyChart message sent Last MMG (if hormonal med) 01/07/23, BiRads 2 benign   Rx pended #30/0RF Refill authorized: Please Advise?

## 2023-04-04 ENCOUNTER — Ambulatory Visit: Payer: 59 | Admitting: Nurse Practitioner

## 2023-04-23 ENCOUNTER — Other Ambulatory Visit: Payer: Self-pay | Admitting: Internal Medicine

## 2023-04-23 DIAGNOSIS — I10 Essential (primary) hypertension: Secondary | ICD-10-CM

## 2023-04-29 ENCOUNTER — Encounter: Payer: Self-pay | Admitting: Internal Medicine

## 2023-04-29 ENCOUNTER — Ambulatory Visit (INDEPENDENT_AMBULATORY_CARE_PROVIDER_SITE_OTHER): Payer: 59 | Admitting: Internal Medicine

## 2023-04-29 VITALS — BP 138/78 | HR 67 | Temp 98.4°F | Resp 16 | Ht 66.5 in | Wt 153.0 lb

## 2023-04-29 DIAGNOSIS — I10 Essential (primary) hypertension: Secondary | ICD-10-CM | POA: Diagnosis not present

## 2023-04-29 DIAGNOSIS — T502X5A Adverse effect of carbonic-anhydrase inhibitors, benzothiadiazides and other diuretics, initial encounter: Secondary | ICD-10-CM

## 2023-04-29 DIAGNOSIS — M47812 Spondylosis without myelopathy or radiculopathy, cervical region: Secondary | ICD-10-CM | POA: Diagnosis not present

## 2023-04-29 DIAGNOSIS — E876 Hypokalemia: Secondary | ICD-10-CM

## 2023-04-29 DIAGNOSIS — F5101 Primary insomnia: Secondary | ICD-10-CM

## 2023-04-29 DIAGNOSIS — Z87891 Personal history of nicotine dependence: Secondary | ICD-10-CM

## 2023-04-29 DIAGNOSIS — E519 Thiamine deficiency, unspecified: Secondary | ICD-10-CM

## 2023-04-29 DIAGNOSIS — T50905A Adverse effect of unspecified drugs, medicaments and biological substances, initial encounter: Secondary | ICD-10-CM

## 2023-04-29 DIAGNOSIS — E785 Hyperlipidemia, unspecified: Secondary | ICD-10-CM | POA: Diagnosis not present

## 2023-04-29 DIAGNOSIS — Z1211 Encounter for screening for malignant neoplasm of colon: Secondary | ICD-10-CM | POA: Insufficient documentation

## 2023-04-29 DIAGNOSIS — N1831 Chronic kidney disease, stage 3a: Secondary | ICD-10-CM | POA: Diagnosis not present

## 2023-04-29 DIAGNOSIS — R3121 Asymptomatic microscopic hematuria: Secondary | ICD-10-CM

## 2023-04-29 LAB — CBC WITH DIFFERENTIAL/PLATELET
Basophils Absolute: 0 10*3/uL (ref 0.0–0.1)
Basophils Relative: 0.5 % (ref 0.0–3.0)
Eosinophils Absolute: 0.1 10*3/uL (ref 0.0–0.7)
Eosinophils Relative: 0.8 % (ref 0.0–5.0)
HCT: 37 % (ref 36.0–46.0)
Hemoglobin: 12 g/dL (ref 12.0–15.0)
Lymphocytes Relative: 38.8 % (ref 12.0–46.0)
Lymphs Abs: 2.9 10*3/uL (ref 0.7–4.0)
MCHC: 32.4 g/dL (ref 30.0–36.0)
MCV: 91.7 fL (ref 78.0–100.0)
Monocytes Absolute: 0.5 10*3/uL (ref 0.1–1.0)
Monocytes Relative: 6.9 % (ref 3.0–12.0)
Neutro Abs: 4 10*3/uL (ref 1.4–7.7)
Neutrophils Relative %: 53 % (ref 43.0–77.0)
Platelets: 268 10*3/uL (ref 150.0–400.0)
RBC: 4.03 Mil/uL (ref 3.87–5.11)
RDW: 12.7 % (ref 11.5–15.5)
WBC: 7.5 10*3/uL (ref 4.0–10.5)

## 2023-04-29 NOTE — Patient Instructions (Signed)
Hypertension, Adult High blood pressure (hypertension) is when the force of blood pumping through the arteries is too strong. The arteries are the blood vessels that carry blood from the heart throughout the body. Hypertension forces the heart to work harder to pump blood and may cause arteries to become narrow or stiff. Untreated or uncontrolled hypertension can lead to a heart attack, heart failure, a stroke, kidney disease, and other problems. A blood pressure reading consists of a higher number over a lower number. Ideally, your blood pressure should be below 120/80. The first ("top") number is called the systolic pressure. It is a measure of the pressure in your arteries as your heart beats. The second ("bottom") number is called the diastolic pressure. It is a measure of the pressure in your arteries as the heart relaxes. What are the causes? The exact cause of this condition is not known. There are some conditions that result in high blood pressure. What increases the risk? Certain factors may make you more likely to develop high blood pressure. Some of these risk factors are under your control, including: Smoking. Not getting enough exercise or physical activity. Being overweight. Having too much fat, sugar, calories, or salt (sodium) in your diet. Drinking too much alcohol. Other risk factors include: Having a personal history of heart disease, diabetes, high cholesterol, or kidney disease. Stress. Having a family history of high blood pressure and high cholesterol. Having obstructive sleep apnea. Age. The risk increases with age. What are the signs or symptoms? High blood pressure may not cause symptoms. Very high blood pressure (hypertensive crisis) may cause: Headache. Fast or irregular heartbeats (palpitations). Shortness of breath. Nosebleed. Nausea and vomiting. Vision changes. Severe chest pain, dizziness, and seizures. How is this diagnosed? This condition is diagnosed by  measuring your blood pressure while you are seated, with your arm resting on a flat surface, your legs uncrossed, and your feet flat on the floor. The cuff of the blood pressure monitor will be placed directly against the skin of your upper arm at the level of your heart. Blood pressure should be measured at least twice using the same arm. Certain conditions can cause a difference in blood pressure between your right and left arms. If you have a high blood pressure reading during one visit or you have normal blood pressure with other risk factors, you may be asked to: Return on a different day to have your blood pressure checked again. Monitor your blood pressure at home for 1 week or longer. If you are diagnosed with hypertension, you may have other blood or imaging tests to help your health care provider understand your overall risk for other conditions. How is this treated? This condition is treated by making healthy lifestyle changes, such as eating healthy foods, exercising more, and reducing your alcohol intake. You may be referred for counseling on a healthy diet and physical activity. Your health care provider may prescribe medicine if lifestyle changes are not enough to get your blood pressure under control and if: Your systolic blood pressure is above 130. Your diastolic blood pressure is above 80. Your personal target blood pressure may vary depending on your medical conditions, your age, and other factors. Follow these instructions at home: Eating and drinking  Eat a diet that is high in fiber and potassium, and low in sodium, added sugar, and fat. An example of this eating plan is called the DASH diet. DASH stands for Dietary Approaches to Stop Hypertension. To eat this way: Eat   plenty of fresh fruits and vegetables. Try to fill one half of your plate at each meal with fruits and vegetables. Eat whole grains, such as whole-wheat pasta, brown rice, or whole-grain bread. Fill about one  fourth of your plate with whole grains. Eat or drink low-fat dairy products, such as skim milk or low-fat yogurt. Avoid fatty cuts of meat, processed or cured meats, and poultry with skin. Fill about one fourth of your plate with lean proteins, such as fish, chicken without skin, beans, eggs, or tofu. Avoid pre-made and processed foods. These tend to be higher in sodium, added sugar, and fat. Reduce your daily sodium intake. Many people with hypertension should eat less than 1,500 mg of sodium a day. Do not drink alcohol if: Your health care provider tells you not to drink. You are pregnant, may be pregnant, or are planning to become pregnant. If you drink alcohol: Limit how much you have to: 0-1 drink a day for women. 0-2 drinks a day for men. Know how much alcohol is in your drink. In the [place] one drink equals one 12 oz bottle of beer (355 mL), one 5 oz glass of wine (148 mL), or one 1 oz glass of hard liquor (44 mL). Lifestyle  Work with your health care provider to maintain a healthy body weight or to lose weight. Ask what an ideal weight is for you. Get at least 30 minutes of exercise that causes your heart to beat faster (aerobic exercise) most days of the week. Activities may include walking, swimming, or biking. Include exercise to strengthen your muscles (resistance exercise), such as Pilates or lifting weights, as part of your weekly exercise routine. Try to do these types of exercises for 30 minutes at least 3 days a week. Do not use any products that contain nicotine or tobacco. These products include cigarettes, chewing tobacco, and vaping devices, such as e-cigarettes. If you need help quitting, ask your health care provider. Monitor your blood pressure at home as told by your health care provider. Keep all follow-up visits. This is important. Medicines Take over-the-counter and prescription medicines only as told by your health care provider. Follow directions carefully. Blood  pressure medicines must be taken as prescribed. Do not skip doses of blood pressure medicine. Doing this puts you at risk for problems and can make the medicine less effective. Ask your health care provider about side effects or reactions to medicines that you should watch for. Contact a health care provider if you: Think you are having a reaction to a medicine you are taking. Have headaches that keep coming back (recurring). Feel dizzy. Have swelling in your ankles. Have trouble with your vision. Get help right away if you: Develop a severe headache or confusion. Have unusual weakness or numbness. Feel faint. Have severe pain in your chest or abdomen. Vomit repeatedly. Have trouble breathing. These symptoms may be an emergency. Get help right away. Call 911. Do not wait to see if the symptoms will go away. Do not drive yourself to the hospital. Summary Hypertension is when the force of blood pumping through your arteries is too strong. If this condition is not controlled, it may put you at risk for serious complications. Your personal target blood pressure may vary depending on your medical conditions, your age, and other factors. For most people, a normal blood pressure is less than 120/80. Hypertension is treated with lifestyle changes, medicines, or a combination of both. Lifestyle changes include losing weight, eating a healthy,   low-sodium diet, exercising more, and limiting alcohol. This information is not intended to replace advice given to you by your health care provider. Make sure you discuss any questions you have with your health care provider. Document Revised: 04/25/2021 Document Reviewed: 04/25/2021 Elsevier Patient Education  2024 Elsevier Inc.  

## 2023-04-29 NOTE — Progress Notes (Unsigned)
Subjective:  Patient ID: Samantha Reed, female    DOB: 08/03/1970  Age: 52 y.o. MRN: [id number]  CC: Hypertension and Hyperlipidemia   HPI Samantha Reed presents for f/up ----  Discussed the use of AI scribe software for clinical note transcription with the patient, who gave verbal consent to proceed.  History of Present Illness   The patient, with a history of hypertension, presented for a routine follow-up. The patient denied any current symptoms related to hypertension such as headaches, blurred vision, dizziness, or lightheadedness. She also denied any abdominal pain.  For pain management, the patient has been relying on Tylenol and meloxicam, the latter primarily for neck pain. She described her neck pain as stiffness that limits her range of motion, particularly when driving. The patient reported that consistent use of meloxicam significantly improves her mobility and reduces the stiffness.  The patient also reported taking Lunesta for insomnia and had completed a course of thiamine supplement for 30 to 60 days. She denied any symptoms of vitamin deficiency such as numbness or tingling.  Regarding lifestyle habits, the patient quit smoking approximately a year and a half ago but admitted to current alcohol and marijuana use, primarily to aid sleep. She reported no current use of cigarettes.       Outpatient Medications Prior to Visit  Medication Sig Dispense Refill   nabumetone (RELAFEN) 750 MG tablet Take 1 tablet (750 mg total) by mouth 2 (two) times daily as needed. 20 tablet 0   rosuvastatin (CRESTOR) 10 MG tablet TAKE 1 TABLET BY MOUTH EVERY DAY 30 tablet 5   Eszopiclone 3 MG TABS TAKE 1 TABLET BY MOUTH AT BEDTIME TAKE IMMEDIATELY BEFORE BEDTIME 30 tablet 2   meloxicam (MOBIC) 7.5 MG tablet Take 1 tablet (7.5 mg total) by mouth daily. 90 tablet 0   triamterene-hydrochlorothiazide (DYAZIDE) 37.5-25 MG capsule TAKE 1 CAPSULE BY MOUTH EVERY DAY 30 capsule 0   VEOZAH 45  MG TABS Take 1 tablet by mouth every day (Patient not taking: Reported on 04/29/2023) 30 tablet 0   thiamine (VITAMIN B-1) 50 MG tablet Take 1 tablet (50 mg total) by mouth daily. (Patient not taking: Reported on 04/29/2023) 90 tablet 1   No facility-administered medications prior to visit.    ROS Review of Systems  Constitutional: Negative.  Negative for appetite change, chills, diaphoresis, fatigue and fever.  HENT: Negative.    Respiratory: Negative.  Negative for chest tightness, shortness of breath and wheezing.   Cardiovascular:  Negative for chest pain, palpitations and leg swelling.  Gastrointestinal:  Negative for abdominal pain, constipation, diarrhea, nausea and vomiting.  Genitourinary: Negative.  Negative for difficulty urinating.  Musculoskeletal:  Positive for neck stiffness. Negative for arthralgias, back pain and neck pain.  Skin: Negative.   Neurological:  Negative for dizziness, weakness and headaches.  Hematological:  Negative for adenopathy. Does not bruise/bleed easily.  Psychiatric/Behavioral:  Positive for dysphoric mood and sleep disturbance. Negative for confusion, decreased concentration and suicidal ideas. The patient is not nervous/anxious.     Objective:  BP 138/78 (BP Location: Left Arm, Patient Position: Sitting, Cuff Size: Normal)   Pulse 67   Temp 98.4 F (36.9 C) (Oral)   Resp 16   Ht 5' 6.5" (1.689 m)   Wt 153 lb (69.4 kg)   LMP 11/03/2018   SpO2 96%   BMI 24.32 kg/m   BP Readings from Last 3 Encounters:  04/29/23 138/78  10/08/22 123/63  10/01/22 108/68    Wt Readings from  Last 3 Encounters:  04/29/23 153 lb (69.4 kg)  10/08/22 149 lb 7.6 oz (67.8 kg)  07/20/22 151 lb (68.5 kg)    Physical Exam Vitals reviewed.  Constitutional:      Appearance: Normal appearance. She is not diaphoretic.  HENT:     Mouth/Throat:     Mouth: Mucous membranes are moist.  Eyes:     General: No scleral icterus.    Conjunctiva/sclera: Conjunctivae  normal.  Cardiovascular:     Rate and Rhythm: Normal rate and regular rhythm.     Pulses: Normal pulses.     Heart sounds: No murmur heard.    No friction rub. No gallop.  Pulmonary:     Effort: Pulmonary effort is normal.     Breath sounds: No stridor. No wheezing, rhonchi or rales.  Abdominal:     General: Abdomen is flat.     Palpations: There is no mass.     Tenderness: There is no abdominal tenderness. There is no guarding.     Hernia: No hernia is present.  Musculoskeletal:        General: Normal range of motion.     Cervical back: Neck supple.     Right lower leg: No edema.     Left lower leg: No edema.  Lymphadenopathy:     Cervical: No cervical adenopathy.  Skin:    General: Skin is warm and dry.  Neurological:     General: No focal deficit present.     Mental Status: She is alert. Mental status is at baseline.  Psychiatric:        Mood and Affect: Mood normal.        Behavior: Behavior normal.     Lab Results  Component Value Date   WBC 7.5 04/29/2023   HGB 12.0 04/29/2023   HCT 37.0 04/29/2023   PLT 268.0 04/29/2023   GLUCOSE 93 04/29/2023   CHOL 198 07/10/2022   TRIG 96.0 07/10/2022   HDL 52.00 07/10/2022   LDLCALC 126 (H) 07/10/2022   ALT 20 04/29/2023   AST 23 04/29/2023   NA 141 04/29/2023   K 3.2 (L) 04/29/2023   CL 100 04/29/2023   CREATININE 1.08 04/29/2023   BUN 11 04/29/2023   CO2 32 04/29/2023   TSH 2.01 07/10/2022   INR 1.2 04/25/2019    DG Ankle Complete Right  Result Date: 10/09/2022 CLINICAL DATA:  Elective surgery. EXAM: RIGHT ANKLE - COMPLETE 3+ VIEW COMPARISON:  Right ankle radiographs 10/01/2022, CT right ankle 10/05/2022 FINDINGS: Images were performed intraoperatively without the presence of a radiologist. Interval lateral plate and screw fixation of the previously seen oblique distal fibular metadiaphyseal fracture. Associated distal tibiofibular syndesmosis thyroid fixation. The ankle mortise is symmetric and intact. Moderate  lateral malleolar soft tissue swelling. Total fluoroscopy images: 4 Total fluoroscopy time: 70 seconds Total dose: Radiation Exposure Index (as provided by the fluoroscopic device): 3.09 mGy air Kerma Please see intraoperative findings for further detail. IMPRESSION: Intraoperative fluoroscopic guidance provided for distal fibular metadiaphyseal fracture fixation and distal tibiofibular syndesmosis fixation. Electronically Signed   By: Neita Garnet M.D.   On: 10/09/2022 10:02    Assessment & Plan:  Screening for colon cancer -     Cologuard  Essential hypertension, benign- Her blood pressure is adequately well-controlled but she has developed hypokalemia and hypercalcemia.  Will discontinue the thiazide diuretic and will switch to spironolactone and a loop diuretic. -     Basic metabolic panel; Future -  Urinalysis, Routine w reflex microscopic; Future -     CBC with Differential/Platelet; Future -     Spironolactone; Take 1 tablet (25 mg total) by mouth daily.  Dispense: 90 each; Refill: 0 -     Torsemide; Take 1 tablet (20 mg total) by mouth 2 (two) times daily.  Dispense: 180 tablet; Refill: 0  Hyperlipidemia LDL goal <130 - LDL goal achieved. Doing well on the statin  -     Hepatic function panel; Future  Stage 3a chronic kidney disease (HCC) - Her renal function is stable. -     Basic metabolic panel; Future -     Urinalysis, Routine w reflex microscopic; Future -     AMB Referral [facility] Care Management  Spondylosis of cervical region without myelopathy or radiculopathy -     Meloxicam; Take 1 tablet (7.5 mg total) by mouth daily.  Dispense: 90 tablet; Refill: 0  Former smoker -     Ambulatory Referral for Lung Cancer Scre  Hypercalcemia due to a drug -     AMB Referral [facility] Care Management  Diuretic-induced hypokalemia -     Spironolactone; Take 1 tablet (25 mg total) by mouth daily.  Dispense: 90 each; Refill: 0 -     AMB Referral [facility] Care Management  Primary insomnia -      Eszopiclone; Take 1 tablet (3 mg total) by mouth at bedtime. Take immediately before bedtime  Dispense: 90 tablet; Refill: 0  Thiamine deficiency -     Vitamin B-1; Take 1 tablet (50 mg total) by mouth daily.  Dispense: 90 tablet; Refill: 1     Follow-up: Return in about 4 months (around 08/30/2023).  Sanda Linger, MD

## 2023-04-30 LAB — HEPATIC FUNCTION PANEL
ALT: 20 U/L (ref 0–35)
AST: 23 U/L (ref 0–37)
Albumin: 4.7 g/dL (ref 3.5–5.2)
Alkaline Phosphatase: 60 U/L (ref 39–117)
Bilirubin, Direct: 0.1 mg/dL (ref 0.0–0.3)
Total Bilirubin: 0.6 mg/dL (ref 0.2–1.2)
Total Protein: 8.1 g/dL (ref 6.0–8.3)

## 2023-04-30 LAB — BASIC METABOLIC PANEL
BUN: 11 mg/dL (ref 6–23)
CO2: 32 meq/L (ref 19–32)
Calcium: 10.7 mg/dL — ABNORMAL HIGH (ref 8.4–10.5)
Chloride: 100 meq/L (ref 96–112)
Creatinine, Ser: 1.08 mg/dL (ref 0.40–1.20)
GFR: 58.6 mL/min — ABNORMAL LOW (ref >60.00)
Glucose, Bld: 93 mg/dL (ref 70–99)
Potassium: 3.2 meq/L — ABNORMAL LOW (ref 3.5–5.1)
Sodium: 141 meq/L (ref 135–145)

## 2023-04-30 LAB — URINALYSIS, ROUTINE W REFLEX MICROSCOPIC
Bilirubin Urine: NEGATIVE
Ketones, ur: NEGATIVE
Leukocytes,Ua: NEGATIVE
Nitrite: NEGATIVE
Specific Gravity, Urine: 1.01 (ref 1.000–1.030)
Total Protein, Urine: NEGATIVE
Urine Glucose: NEGATIVE
Urobilinogen, UA: 0.2 (ref 0.0–1.0)
pH: 6 (ref 5.0–8.0)

## 2023-04-30 MED ORDER — ESZOPICLONE 3 MG PO TABS: 3.0000 mg | ORAL_TABLET | Freq: Every day | ORAL | 0 refills | Status: DC

## 2023-04-30 MED ORDER — MELOXICAM 7.5 MG PO TABS: 7.5000 mg | ORAL_TABLET | Freq: Every day | ORAL | 0 refills | Status: DC

## 2023-04-30 MED ORDER — VITAMIN B-1 50 MG PO TABS: 50.0000 mg | ORAL_TABLET | Freq: Every day | ORAL | 1 refills | Status: DC

## 2023-04-30 MED ORDER — SPIRONOLACTONE 25 MG PO TABS: 25.0000 mg | ORAL_TABLET | Freq: Every day | ORAL | 0 refills | Status: DC

## 2023-04-30 MED ORDER — TORSEMIDE 20 MG PO TABS: 20.0000 mg | ORAL_TABLET | Freq: Two times a day (BID) | ORAL | 0 refills | Status: DC

## 2023-05-01 ENCOUNTER — Other Ambulatory Visit: Payer: Self-pay | Admitting: Internal Medicine

## 2023-05-01 ENCOUNTER — Encounter: Payer: Self-pay | Admitting: Internal Medicine

## 2023-05-02 ENCOUNTER — Encounter: Payer: Self-pay | Admitting: Internal Medicine

## 2023-05-02 ENCOUNTER — Other Ambulatory Visit: Payer: Self-pay | Admitting: Internal Medicine

## 2023-05-02 DIAGNOSIS — E876 Hypokalemia: Secondary | ICD-10-CM

## 2023-05-02 DIAGNOSIS — I10 Essential (primary) hypertension: Secondary | ICD-10-CM

## 2023-05-02 DIAGNOSIS — M47812 Spondylosis without myelopathy or radiculopathy, cervical region: Secondary | ICD-10-CM

## 2023-05-02 DIAGNOSIS — F5101 Primary insomnia: Secondary | ICD-10-CM

## 2023-05-02 MED ORDER — SPIRONOLACTONE 25 MG PO TABS: 25.0000 mg | ORAL_TABLET | Freq: Every day | ORAL | 0 refills | Status: DC

## 2023-05-02 MED ORDER — TORSEMIDE 20 MG PO TABS: 20.0000 mg | ORAL_TABLET | Freq: Two times a day (BID) | ORAL | 0 refills | Status: DC

## 2023-05-02 MED ORDER — ESZOPICLONE 3 MG PO TABS: 3.0000 mg | ORAL_TABLET | Freq: Every day | ORAL | 0 refills | Status: DC

## 2023-05-02 NOTE — Telephone Encounter (Signed)
Spoke with patient and advised her that her medication was sent to [facility], she advised me that that was the wrong pharmacy, Dr. Yetta Barre has sent her medication to her local pharmacy

## 2023-05-06 ENCOUNTER — Telehealth: Payer: Self-pay

## 2023-05-06 NOTE — Progress Notes (Signed)
   Care Guide Note  05/06/2023 Name: Samantha Reed MRN: [id number] DOB: Nov 11, 1970  Referred by: Etta Grandchild, MD Reason for referral : No chief complaint on file.   Samantha Reed is a 52 y.o. year old female who is a primary care patient of Etta Grandchild, MD. Samantha Reed was referred to the pharmacist for assistance related to HTN and HLD.    Successful contact was made with the patient to discuss pharmacy services. Patient declines engagement at this time. Contact information was provided to the patient should they wish to reach out for assistance at a later time.  Penne Lash, RMA Care Guide [facility]  [place], [place] [address] Direct Dial: [phone] [email or web address]

## 2023-05-13 ENCOUNTER — Encounter: Payer: Self-pay | Admitting: Nurse Practitioner

## 2023-05-13 DIAGNOSIS — N951 Menopausal and female climacteric states: Secondary | ICD-10-CM

## 2023-05-13 MED ORDER — VEOZAH 45 MG PO TABS: 1.0000 | ORAL_TABLET | Freq: Every day | ORAL | 0 refills | Status: DC

## 2023-05-13 NOTE — Telephone Encounter (Signed)
Last seen/AEX 03/16/2023-TW AEX scheduled 07/08/2023  Mammo UTD.  BMP checked 04/29/2023 by PCP.

## 2023-05-15 MED ORDER — VEOZAH 45 MG PO TABS: 1.0000 | ORAL_TABLET | Freq: Every day | ORAL | 0 refills | Status: DC

## 2023-05-15 NOTE — Addendum Note (Signed)
Addended by: Jodelle Red D on: 05/15/2023 10:43 AM   Modules accepted: Orders

## 2023-05-15 NOTE — Telephone Encounter (Signed)
Rx was sent to [facility] in the past. Rx resent to that pharmacy and pt notified.  Routing to provider for final review and closing encounter.

## 2023-05-17 ENCOUNTER — Telehealth: Payer: Self-pay

## 2023-05-17 NOTE — Telephone Encounter (Signed)
LVM for patient to call back and discuss eligibility for the LCS program.

## 2023-05-25 LAB — COLOGUARD: COLOGUARD: NEGATIVE

## 2023-07-05 ENCOUNTER — Encounter: Payer: Self-pay | Admitting: Emergency Medicine

## 2023-07-08 ENCOUNTER — Other Ambulatory Visit (HOSPITAL_COMMUNITY)
Admission: RE | Admit: 2023-07-08 | Discharge: 2023-07-08 | Disposition: A | Discharge status: Home / Self Care | Payer: 59 | Source: Ambulatory Visit | Attending: Nurse Practitioner | Admitting: Nurse Practitioner

## 2023-07-08 ENCOUNTER — Encounter: Payer: Self-pay | Admitting: Nurse Practitioner

## 2023-07-08 ENCOUNTER — Ambulatory Visit (INDEPENDENT_AMBULATORY_CARE_PROVIDER_SITE_OTHER): Payer: 59 | Admitting: Nurse Practitioner

## 2023-07-08 VITALS — BP 124/76 | HR 72 | Ht 66.0 in | Wt 156.0 lb

## 2023-07-08 DIAGNOSIS — Z01419 Encounter for gynecological examination (general) (routine) without abnormal findings: Secondary | ICD-10-CM

## 2023-07-08 DIAGNOSIS — N951 Menopausal and female climacteric states: Secondary | ICD-10-CM | POA: Diagnosis not present

## 2023-07-08 DIAGNOSIS — Z87410 Personal history of cervical dysplasia: Secondary | ICD-10-CM

## 2023-07-08 NOTE — Progress Notes (Signed)
 Samantha Reed 1971/04/02 [id number]   History:  53 y.o. [id number] presents for annual exam. Was taking Veozah  for hot flashes and night sweats and doing well but stopped in October. Still having vasomotor symptoms but much better than they were prior to Veozah . S/P 2020 TAH for fibroids. 2011 LEEP CIN-2/3, subsequent normal. HTN, HLD managed by PCP. Quit smoking 1.5 years ago.   Gynecologic History Patient's last menstrual period was 11/03/2018.   Contraception/Family planning: status post hysterectomy Sexually active: Yes  Health Maintenance Last Pap (vaginal): 10/31/2020. Results were: Normal neg HPV Last mammogram: 01/07/2023. Results were: Benign fat necrosis left breast Last colonoscopy: 9 years ago per patient. Negative Cologuard 05/17/23 Last Dexa: Not indicated  Past medical history, past surgical history, family history and social history were all reviewed and documented in the EPIC chart. Works in engineer, technical sales for [employer]. 2 children, 8yo granddaughter.  ROS:  A ROS was performed and pertinent positives and negatives are included.  Exam:  Vitals:   07/08/23 1443  BP: 124/76  Pulse: 72  SpO2: 98%  Weight: 156 lb (70.8 kg)  Height: 5' 6 (1.676 m)    Body mass index is 25.18 kg/m.  General appearance:  Normal Thyroid :  Symmetrical, normal in size, without palpable masses or nodularity. Respiratory  Auscultation:  Clear without wheezing or rhonchi Cardiovascular  Auscultation:  Regular rate, without rubs, murmurs or gallops  Edema/varicosities:  Not grossly evident Abdominal  Soft,nontender, without masses, guarding or rebound.  Liver/spleen:  No organomegaly noted  Hernia:  None appreciated  Skin  Inspection:  Grossly normal Breasts: Examined lying and sitting.   Right: Without masses, retractions, nipple discharge or axillary adenopathy.   Left: Without masses, retractions, nipple discharge or axillary adenopathy. Pelvic: External genitalia:  no  lesions              Urethra:  normal appearing urethra with no masses, tenderness or lesions              Bartholins and Skenes: normal                 Vagina: normal appearing vagina with normal color and discharge, no lesions              Cervix: absent Bimanual Exam:  Uterus:  absent              Adnexa: no mass, fullness, tenderness              Rectovaginal: Deferred              Anus:  normal, no lesions  Patient informed chaperone available to be present for breast and pelvic exam. Patient has requested no chaperone to be present. Patient has been advised what will be completed during breast and pelvic exam.   Assessment/Plan:  53 y.o. [id number] for annual exam.   Well female exam with routine gynecological exam - Education provided on SBEs, importance of preventative screenings, current guidelines, high calcium  diet, regular exercise, and multivitamin daily. Labs with PCP.   History of cervical dysplasia - Plan: Cytology - PAP( [facility]). 2011 LEEP CIN-2/3, normal paps since.    Vasomotor symptoms due to menopause - stopped Veozah  a few months ago. Doing OK. Still having symptoms but much better than in the past. Planning to start OTC supplement. A couple options provided.   Screening for breast cancer - Continue annual screenings. Normal breast exam today.  Screening for colon cancer - Negative Cologuard 05/2023  Screening for osteoporosis - Average risk. Will plan DXA at age 57.   Return in about 1 year (around 07/07/2024) for Annual.    Annabella DELENA Shutter DNP, 3:07 PM 07/08/2023

## 2023-07-09 LAB — CYTOLOGY - PAP
Comment: NEGATIVE
Diagnosis: NEGATIVE
High risk HPV: NEGATIVE

## 2023-08-24 ENCOUNTER — Other Ambulatory Visit: Payer: Self-pay | Admitting: Internal Medicine

## 2023-08-24 DIAGNOSIS — E785 Hyperlipidemia, unspecified: Secondary | ICD-10-CM

## 2023-08-26 ENCOUNTER — Encounter: Payer: Self-pay | Admitting: Internal Medicine

## 2023-08-28 ENCOUNTER — Other Ambulatory Visit: Payer: Self-pay | Admitting: Internal Medicine

## 2023-08-28 DIAGNOSIS — E785 Hyperlipidemia, unspecified: Secondary | ICD-10-CM

## 2023-08-28 DIAGNOSIS — F5101 Primary insomnia: Secondary | ICD-10-CM

## 2023-08-28 DIAGNOSIS — I10 Essential (primary) hypertension: Secondary | ICD-10-CM

## 2023-08-28 DIAGNOSIS — E876 Hypokalemia: Secondary | ICD-10-CM

## 2023-08-28 DIAGNOSIS — M47812 Spondylosis without myelopathy or radiculopathy, cervical region: Secondary | ICD-10-CM

## 2023-08-28 MED ORDER — ROSUVASTATIN CALCIUM 10 MG PO TABS: 10.0000 mg | ORAL_TABLET | Freq: Every day | ORAL | 0 refills | Status: DC

## 2023-08-28 MED ORDER — MELOXICAM 7.5 MG PO TABS: 7.5000 mg | ORAL_TABLET | Freq: Every day | ORAL | 0 refills | Status: DC

## 2023-08-28 MED ORDER — SPIRONOLACTONE 25 MG PO TABS: 25.0000 mg | ORAL_TABLET | Freq: Every day | ORAL | 0 refills | Status: DC

## 2023-08-28 MED ORDER — ESZOPICLONE 3 MG PO TABS: 3.0000 mg | ORAL_TABLET | Freq: Every day | ORAL | 0 refills | Status: DC

## 2023-09-25 ENCOUNTER — Other Ambulatory Visit: Payer: Self-pay | Admitting: Internal Medicine

## 2023-09-25 DIAGNOSIS — M47812 Spondylosis without myelopathy or radiculopathy, cervical region: Secondary | ICD-10-CM

## 2023-10-07 ENCOUNTER — Ambulatory Visit: Payer: 59 | Admitting: Internal Medicine

## 2023-10-10 ENCOUNTER — Encounter: Payer: Self-pay | Admitting: Internal Medicine

## 2023-10-10 ENCOUNTER — Ambulatory Visit: Admitting: Internal Medicine

## 2023-10-10 VITALS — BP 134/80 | HR 61 | Temp 98.3°F | Ht 66.0 in | Wt 155.6 lb

## 2023-10-10 DIAGNOSIS — E876 Hypokalemia: Secondary | ICD-10-CM

## 2023-10-10 DIAGNOSIS — N1831 Chronic kidney disease, stage 3a: Secondary | ICD-10-CM

## 2023-10-10 DIAGNOSIS — E785 Hyperlipidemia, unspecified: Secondary | ICD-10-CM

## 2023-10-10 DIAGNOSIS — F5101 Primary insomnia: Secondary | ICD-10-CM

## 2023-10-10 DIAGNOSIS — I10 Essential (primary) hypertension: Secondary | ICD-10-CM | POA: Diagnosis not present

## 2023-10-10 DIAGNOSIS — T502X5A Adverse effect of carbonic-anhydrase inhibitors, benzothiadiazides and other diuretics, initial encounter: Secondary | ICD-10-CM

## 2023-10-10 LAB — CBC WITH DIFFERENTIAL/PLATELET
Basophils Absolute: 0 10*3/uL (ref 0.0–0.1)
Basophils Relative: 0.5 % (ref 0.0–3.0)
Eosinophils Absolute: 0.1 10*3/uL (ref 0.0–0.7)
Eosinophils Relative: 1.2 % (ref 0.0–5.0)
HCT: 40.3 % (ref 36.0–46.0)
Hemoglobin: 13.4 g/dL (ref 12.0–15.0)
Lymphocytes Relative: 33.2 % (ref 12.0–46.0)
Lymphs Abs: 2.4 10*3/uL (ref 0.7–4.0)
MCHC: 33.2 g/dL (ref 30.0–36.0)
MCV: 93.9 fl (ref 78.0–100.0)
Monocytes Absolute: 0.5 10*3/uL (ref 0.1–1.0)
Monocytes Relative: 6.5 % (ref 3.0–12.0)
Neutro Abs: 4.2 10*3/uL (ref 1.4–7.7)
Neutrophils Relative %: 58.6 % (ref 43.0–77.0)
Platelets: 321 10*3/uL (ref 150.0–400.0)
RBC: 4.29 Mil/uL (ref 3.87–5.11)
RDW: 13.3 % (ref 11.5–15.5)
WBC: 7.2 10*3/uL (ref 4.0–10.5)

## 2023-10-10 LAB — HEPATIC FUNCTION PANEL
ALT: 20 U/L (ref 0–35)
AST: 26 U/L (ref 0–37)
Albumin: 5.2 g/dL (ref 3.5–5.2)
Alkaline Phosphatase: 73 U/L (ref 39–117)
Bilirubin, Direct: 0.1 mg/dL (ref 0.0–0.3)
Total Bilirubin: 0.6 mg/dL (ref 0.2–1.2)
Total Protein: 8.7 g/dL — ABNORMAL HIGH (ref 6.0–8.3)

## 2023-10-10 LAB — LIPID PANEL
Cholesterol: 179 mg/dL (ref 0–200)
HDL: 54.5 mg/dL (ref >39.00)
LDL Cholesterol: 98 mg/dL (ref 0–99)
NonHDL: 124.17
Total CHOL/HDL Ratio: 3
Triglycerides: 131 mg/dL (ref 0.0–149.0)
VLDL: 26.2 mg/dL (ref 0.0–40.0)

## 2023-10-10 LAB — BASIC METABOLIC PANEL WITH GFR
BUN: 18 mg/dL (ref 6–23)
CO2: 32 meq/L (ref 19–32)
Calcium: 10.4 mg/dL (ref 8.4–10.5)
Chloride: 100 meq/L (ref 96–112)
Creatinine, Ser: 1.37 mg/dL — ABNORMAL HIGH (ref 0.40–1.20)
GFR: 43.91 mL/min — ABNORMAL LOW (ref >60.00)
Glucose, Bld: 93 mg/dL (ref 70–99)
Potassium: 4.2 meq/L (ref 3.5–5.1)
Sodium: 142 meq/L (ref 135–145)

## 2023-10-10 LAB — TSH: TSH: 0.99 u[IU]/mL (ref 0.35–5.50)

## 2023-10-10 LAB — MAGNESIUM: Magnesium: 1.7 mg/dL (ref 1.5–2.5)

## 2023-10-10 MED ORDER — ESZOPICLONE 3 MG PO TABS: 3.0000 mg | ORAL_TABLET | Freq: Every day | ORAL | 1 refills | Status: DC

## 2023-10-10 MED ORDER — ROSUVASTATIN CALCIUM 10 MG PO TABS: 10.0000 mg | ORAL_TABLET | Freq: Every day | ORAL | 1 refills | Status: DC

## 2023-10-10 MED ORDER — SPIRONOLACTONE 25 MG PO TABS: 25.0000 mg | ORAL_TABLET | Freq: Every day | ORAL | 1 refills | Status: DC

## 2023-10-10 MED ORDER — EMPAGLIFLOZIN 10 MG PO TABS: 10.0000 mg | ORAL_TABLET | Freq: Every day | ORAL | 1 refills | Status: DC

## 2023-10-10 NOTE — Patient Instructions (Signed)
 Hypertension, Adult High blood pressure (hypertension) is when the force of blood pumping through the arteries is too strong. The arteries are the blood vessels that carry blood from the heart throughout the body. Hypertension forces the heart to work harder to pump blood and may cause arteries to become narrow or stiff. Untreated or uncontrolled hypertension can lead to a heart attack, heart failure, a stroke, kidney disease, and other problems. A blood pressure reading consists of a higher number over a lower number. Ideally, your blood pressure should be below 120/80. The first ("top") number is called the systolic pressure. It is a measure of the pressure in your arteries as your heart beats. The second ("bottom") number is called the diastolic pressure. It is a measure of the pressure in your arteries as the heart relaxes. What are the causes? The exact cause of this condition is not known. There are some conditions that result in high blood pressure. What increases the risk? Certain factors may make you more likely to develop high blood pressure. Some of these risk factors are under your control, including: Smoking. Not getting enough exercise or physical activity. Being overweight. Having too much fat, sugar, calories, or salt (sodium) in your diet. Drinking too much alcohol. Other risk factors include: Having a personal history of heart disease, diabetes, high cholesterol, or kidney disease. Stress. Having a family history of high blood pressure and high cholesterol. Having obstructive sleep apnea. Age. The risk increases with age. What are the signs or symptoms? High blood pressure may not cause symptoms. Very high blood pressure (hypertensive crisis) may cause: Headache. Fast or irregular heartbeats (palpitations). Shortness of breath. Nosebleed. Nausea and vomiting. Vision changes. Severe chest pain, dizziness, and seizures. How is this diagnosed? This condition is diagnosed by  measuring your blood pressure while you are seated, with your arm resting on a flat surface, your legs uncrossed, and your feet flat on the floor. The cuff of the blood pressure monitor will be placed directly against the skin of your upper arm at the level of your heart. Blood pressure should be measured at least twice using the same arm. Certain conditions can cause a difference in blood pressure between your right and left arms. If you have a high blood pressure reading during one visit or you have normal blood pressure with other risk factors, you may be asked to: Return on a different day to have your blood pressure checked again. Monitor your blood pressure at home for 1 week or longer. If you are diagnosed with hypertension, you may have other blood or imaging tests to help your health care provider understand your overall risk for other conditions. How is this treated? This condition is treated by making healthy lifestyle changes, such as eating healthy foods, exercising more, and reducing your alcohol intake. You may be referred for counseling on a healthy diet and physical activity. Your health care provider may prescribe medicine if lifestyle changes are not enough to get your blood pressure under control and if: Your systolic blood pressure is above 130. Your diastolic blood pressure is above 80. Your personal target blood pressure may vary depending on your medical conditions, your age, and other factors. Follow these instructions at home: Eating and drinking  Eat a diet that is high in fiber and potassium, and low in sodium, added sugar, and fat. An example of this eating plan is called the DASH diet. DASH stands for Dietary Approaches to Stop Hypertension. To eat this way: Eat  plenty of fresh fruits and vegetables. Try to fill one half of your plate at each meal with fruits and vegetables. Eat whole grains, such as whole-wheat pasta, brown rice, or whole-grain bread. Fill about one  fourth of your plate with whole grains. Eat or drink low-fat dairy products, such as skim milk or low-fat yogurt. Avoid fatty cuts of meat, processed or cured meats, and poultry with skin. Fill about one fourth of your plate with lean proteins, such as fish, chicken without skin, beans, eggs, or tofu. Avoid pre-made and processed foods. These tend to be higher in sodium, added sugar, and fat. Reduce your daily sodium intake. Many people with hypertension should eat less than 1,500 mg of sodium a day. Do not drink alcohol if: Your health care provider tells you not to drink. You are pregnant, may be pregnant, or are planning to become pregnant. If you drink alcohol: Limit how much you have to: 0-1 drink a day for women. 0-2 drinks a day for men. Know how much alcohol is in your drink. In the [place] one drink equals one 12 oz bottle of beer (355 mL), one 5 oz glass of wine (148 mL), or one 1 oz glass of hard liquor (44 mL). Lifestyle  Work with your health care provider to maintain a healthy body weight or to lose weight. Ask what an ideal weight is for you. Get at least 30 minutes of exercise that causes your heart to beat faster (aerobic exercise) most days of the week. Activities may include walking, swimming, or biking. Include exercise to strengthen your muscles (resistance exercise), such as Pilates or lifting weights, as part of your weekly exercise routine. Try to do these types of exercises for 30 minutes at least 3 days a week. Do not use any products that contain nicotine or tobacco. These products include cigarettes, chewing tobacco, and vaping devices, such as e-cigarettes. If you need help quitting, ask your health care provider. Monitor your blood pressure at home as told by your health care provider. Keep all follow-up visits. This is important. Medicines Take over-the-counter and prescription medicines only as told by your health care provider. Follow directions carefully. Blood  pressure medicines must be taken as prescribed. Do not skip doses of blood pressure medicine. Doing this puts you at risk for problems and can make the medicine less effective. Ask your health care provider about side effects or reactions to medicines that you should watch for. Contact a health care provider if you: Think you are having a reaction to a medicine you are taking. Have headaches that keep coming back (recurring). Feel dizzy. Have swelling in your ankles. Have trouble with your vision. Get help right away if you: Develop a severe headache or confusion. Have unusual weakness or numbness. Feel faint. Have severe pain in your chest or abdomen. Vomit repeatedly. Have trouble breathing. These symptoms may be an emergency. Get help right away. Call 911. Do not wait to see if the symptoms will go away. Do not drive yourself to the hospital. Summary Hypertension is when the force of blood pumping through your arteries is too strong. If this condition is not controlled, it may put you at risk for serious complications. Your personal target blood pressure may vary depending on your medical conditions, your age, and other factors. For most people, a normal blood pressure is less than 120/80. Hypertension is treated with lifestyle changes, medicines, or a combination of both. Lifestyle changes include losing weight, eating a healthy,  low-sodium diet, exercising more, and limiting alcohol. This information is not intended to replace advice given to you by your health care provider. Make sure you discuss any questions you have with your health care provider. Document Revised: 04/25/2021 Document Reviewed: 04/25/2021 Elsevier Patient Education  2024 [employer].

## 2023-10-10 NOTE — Progress Notes (Unsigned)
 Subjective:  Patient ID: Samantha Reed, female    DOB: 04-17-1971  Age: 53 y.o. MRN: [id number]  CC: Hypertension and Hyperlipidemia   HPI Samantha Reed presents for f/up ----  Outpatient Medications Prior to Visit  Medication Sig Dispense Refill   Eszopiclone 3 MG TABS Take 1 tablet (3 mg total) by mouth at bedtime. Take immediately before bedtime 30 tablet 0   meloxicam (MOBIC) 7.5 MG tablet TAKE 1 TABLET BY MOUTH EVERY DAY 30 tablet 0   rosuvastatin (CRESTOR) 10 MG tablet Take 1 tablet (10 mg total) by mouth daily. 30 tablet 0   spironolactone (ALDACTONE) 25 MG tablet Take 1 tablet (25 mg total) by mouth daily. 30 tablet 0   thiamine (VITAMIN B-1) 50 MG tablet Take 1 tablet (50 mg total) by mouth daily. 90 tablet 1   No facility-administered medications prior to visit.    ROS Review of Systems  Objective:  BP 134/80 (BP Location: Left Arm, Patient Position: Sitting, Cuff Size: Normal)   Pulse 61   Temp 98.3 F (36.8 C) (Oral)   Ht 5\' 6"  (1.676 m)   Wt 155 lb 9.6 oz (70.6 kg)   LMP 11/03/2018   SpO2 99%   BMI 25.11 kg/m   BP Readings from Last 3 Encounters:  10/10/23 134/80  07/08/23 124/76  04/29/23 138/78    Wt Readings from Last 3 Encounters:  10/10/23 155 lb 9.6 oz (70.6 kg)  07/08/23 156 lb (70.8 kg)  04/29/23 153 lb (69.4 kg)    Physical Exam  Lab Results  Component Value Date   WBC 7.2 10/10/2023   HGB 13.4 10/10/2023   HCT 40.3 10/10/2023   PLT 321.0 10/10/2023   GLUCOSE 93 10/10/2023   CHOL 179 10/10/2023   TRIG 131.0 10/10/2023   HDL 54.50 10/10/2023   LDLCALC 98 10/10/2023   ALT 20 10/10/2023   AST 26 10/10/2023   NA 142 10/10/2023   K 4.2 10/10/2023   CL 100 10/10/2023   CREATININE 1.37 (H) 10/10/2023   BUN 18 10/10/2023   CO2 32 10/10/2023   TSH 0.99 10/10/2023   INR 1.2 04/25/2019    No results found.  Assessment & Plan:  Essential hypertension, benign -     Basic metabolic panel with GFR; Future -     CBC with  Differential/Platelet; Future -     TSH; Future -     Spironolactone; Take 1 tablet (25 mg total) by mouth daily.  Dispense: 90 tablet; Refill: 1  Stage 3a chronic kidney disease (HCC) -     Basic metabolic panel with GFR; Future -     Empagliflozin; Take 1 tablet (10 mg total) by mouth daily before breakfast.  Dispense: 90 tablet; Refill: 1 -     US RENAL; Future  Hyperlipidemia LDL goal <130 -     Lipid panel; Future -     Hepatic function panel; Future -     TSH; Future -     Rosuvastatin Calcium; Take 1 tablet (10 mg total) by mouth daily.  Dispense: 90 tablet; Refill: 1  Hypokalemia -     Basic metabolic panel with GFR; Future -     Magnesium; Future  Primary insomnia -     Eszopiclone; Take 1 tablet (3 mg total) by mouth at bedtime. Take immediately before bedtime  Dispense: 90 tablet; Refill: 1  Diuretic-induced hypokalemia -     Spironolactone; Take 1 tablet (25 mg total) by mouth daily.  Dispense: 90  tablet; Refill: 1     Follow-up: Return in about 6 months (around 04/10/2024).  Sanda Linger, MD

## 2023-10-11 MED ORDER — TORSEMIDE 20 MG PO TABS: 20.0000 mg | ORAL_TABLET | Freq: Every day | ORAL | 1 refills | Status: DC

## 2023-12-10 ENCOUNTER — Ambulatory Visit: Admitting: Nurse Practitioner

## 2023-12-10 ENCOUNTER — Encounter: Payer: Self-pay | Admitting: Nurse Practitioner

## 2023-12-10 VITALS — BP 112/70 | HR 68 | Resp 16

## 2023-12-10 DIAGNOSIS — B3731 Acute candidiasis of vulva and vagina: Secondary | ICD-10-CM | POA: Diagnosis not present

## 2023-12-10 DIAGNOSIS — N898 Other specified noninflammatory disorders of vagina: Secondary | ICD-10-CM

## 2023-12-10 LAB — WET PREP FOR TRICH, YEAST, CLUE

## 2023-12-10 MED ORDER — FLUCONAZOLE 150 MG PO TABS: 150.0000 mg | ORAL_TABLET | ORAL | 0 refills | Status: DC

## 2023-12-10 NOTE — Progress Notes (Signed)
   Acute Office Visit  Subjective:    Patient ID: Samantha Reed, female    DOB: [birth year], 53 y.o.   MRN: [id number]   HPI 53 y.o. presents today for vaginal itching x 2.5 weeks. Used Metrogel  she had at home with no improvement. Has not noticed discharge or odor. Not sexually active.   Patient's last menstrual period was 11/03/2018.    Review of Systems  Constitutional: Negative.   Genitourinary:  Positive for vaginal pain (Itching). Negative for vaginal discharge.       Objective:     Physical Exam Constitutional:      Appearance: Normal appearance.  Genitourinary:    General: Normal vulva.     Vagina: Vaginal discharge and erythema present.     BP 112/70   Pulse 68   Resp 16   LMP 11/03/2018  Wt Readings from Last 3 Encounters:  10/10/23 155 lb 9.6 oz (70.6 kg)  07/08/23 156 lb (70.8 kg)  04/29/23 153 lb (69.4 kg)        Arvell Birchwood, CMA present as chaperone.   Wet prep + yeast  Assessment & Plan:   Problem List Items Addressed This Visit   None Visit Diagnoses       Vaginal candidiasis    -  Primary   Relevant Medications   fluconazole  (DIFLUCAN ) 150 MG tablet     Vaginal itching       Relevant Orders   WET PREP FOR TRICH, YEAST, CLUE      Plan: Wet prep positive for yeast - Diflucan  150 mg every 3 days x 2 doses.   Return if symptoms worsen or fail to improve.    Andee Bamberger DNP, 2:31 PM 12/10/2023

## 2024-04-07 LAB — HM MAMMOGRAPHY

## 2024-05-18 ENCOUNTER — Other Ambulatory Visit: Payer: Self-pay | Admitting: Internal Medicine

## 2024-05-18 DIAGNOSIS — F5101 Primary insomnia: Secondary | ICD-10-CM

## 2024-05-18 NOTE — Telephone Encounter (Unsigned)
 Copied from CRM #[id number]. Topic: Clinical - Medication Refill >> May 18, 2024 12:53 PM Mercedes MATSU wrote: Medication: Eszopiclone  3 MG TABS  Has the patient contacted their pharmacy? Yes (Agent: If no, request that the patient contact the pharmacy for the refill. If patient does not wish to contact the pharmacy document the reason why and proceed with request.) (Agent: If yes, when and what did the pharmacy advise?)  This is the patient's preferred pharmacy:  [id number] [address] [facility] - [address] [place] [address] Phone: [phone] Fax: [phone]  Is this the correct pharmacy for this prescription? Yes If no, delete pharmacy and type the correct one.   Has the prescription been filled recently? Yes  Is the patient out of the medication? Yes  Has the patient been seen for an appointment in the last year OR does the patient have an upcoming appointment? Yes  Can we respond through MyChart? Yes  Agent: Please be advised that Rx refills may take up to 3 business days. We ask that you follow-up with your pharmacy.

## 2024-05-19 ENCOUNTER — Encounter: Payer: Self-pay | Admitting: Internal Medicine

## 2024-05-20 ENCOUNTER — Other Ambulatory Visit: Payer: Self-pay | Admitting: Internal Medicine

## 2024-05-21 ENCOUNTER — Telehealth: Payer: Self-pay | Admitting: Internal Medicine

## 2024-05-21 NOTE — Telephone Encounter (Unsigned)
 Copied from CRM [id number]. Topic: Clinical - Medication Refill >> May 21, 2024  2:55 PM Mercedes MATSU wrote: Medication: Eszopiclone  3 MG TABS  Has the patient contacted their pharmacy? Yes (Agent: If no, request that the patient contact the pharmacy for the refill. If patient does not wish to contact the pharmacy document the reason why and proceed with request.) (Agent: If yes, when and what did the pharmacy advise?)  This is the patient's preferred pharmacy:  [id number] [address] [facility] - [address] [place] [address] Phone: [phone] Fax: [phone]  Is this the correct pharmacy for this prescription? Yes If no, delete pharmacy and type the correct one.   Has the prescription been filled recently? Yes  Is the patient out of the medication? Yes  Has the patient been seen for an appointment in the last year OR does the patient have an upcoming appointment? Yes  Can we respond through MyChart? Yes  Agent: Please be advised that Rx refills may take up to 3 business days. We ask that you follow-up with your pharmacy.

## 2024-05-22 NOTE — Telephone Encounter (Signed)
 Sent patient a message via MyChart, overdue for a F/U. Has a TOC scheduled in January with new provider. Dr.Jones will not provide refill of medication unless patient is seen.

## 2024-05-26 ENCOUNTER — Other Ambulatory Visit: Payer: Self-pay | Admitting: Internal Medicine

## 2024-05-26 DIAGNOSIS — I10 Essential (primary) hypertension: Secondary | ICD-10-CM

## 2024-05-26 DIAGNOSIS — E785 Hyperlipidemia, unspecified: Secondary | ICD-10-CM

## 2024-05-26 DIAGNOSIS — N1831 Chronic kidney disease, stage 3a: Secondary | ICD-10-CM

## 2024-05-27 ENCOUNTER — Other Ambulatory Visit: Payer: Self-pay | Admitting: Internal Medicine

## 2024-05-27 DIAGNOSIS — F5101 Primary insomnia: Secondary | ICD-10-CM

## 2024-07-13 ENCOUNTER — Encounter: Payer: Self-pay | Admitting: Family Medicine

## 2024-07-13 ENCOUNTER — Ambulatory Visit: Admitting: Family Medicine

## 2024-07-13 VITALS — BP 148/86 | HR 74 | Temp 98.4°F | Ht 66.0 in | Wt 162.0 lb

## 2024-07-13 DIAGNOSIS — E785 Hyperlipidemia, unspecified: Secondary | ICD-10-CM

## 2024-07-13 DIAGNOSIS — Z7689 Persons encountering health services in other specified circumstances: Secondary | ICD-10-CM

## 2024-07-13 DIAGNOSIS — E663 Overweight: Secondary | ICD-10-CM | POA: Diagnosis not present

## 2024-07-13 DIAGNOSIS — Z87891 Personal history of nicotine dependence: Secondary | ICD-10-CM

## 2024-07-13 DIAGNOSIS — I10 Essential (primary) hypertension: Secondary | ICD-10-CM

## 2024-07-13 DIAGNOSIS — F5101 Primary insomnia: Secondary | ICD-10-CM

## 2024-07-13 DIAGNOSIS — N1831 Chronic kidney disease, stage 3a: Secondary | ICD-10-CM | POA: Diagnosis not present

## 2024-07-13 MED ORDER — ESZOPICLONE 3 MG PO TABS: 3.0000 mg | ORAL_TABLET | Freq: Every day | ORAL | 0 refills | Status: AC

## 2024-07-13 MED ORDER — OLMESARTAN MEDOXOMIL 20 MG PO TABS: 20.0000 mg | ORAL_TABLET | Freq: Every day | ORAL | 0 refills | Status: DC

## 2024-07-13 MED ORDER — ROSUVASTATIN CALCIUM 10 MG PO TABS: 10.0000 mg | ORAL_TABLET | Freq: Every day | ORAL | 1 refills | Status: AC

## 2024-07-13 NOTE — Assessment & Plan Note (Signed)
-  Refilled Eszopiclone  3mg  tablet daily at bedtime for insomnia. Ordered urine drug screen for management of medication. Will need to be collected periodically. Signed controlled substance agreement. Will need to be renewed yearly. Will refill 30 day supply at a time.

## 2024-07-13 NOTE — Assessment & Plan Note (Signed)
 Continue Rosuvastatin  10mg  daily. Refilled medication. Ordered lipid panel and CMP. Patient is not fasting and will go either go to [facility] or make an appointment here when able to fast.

## 2024-07-13 NOTE — Progress Notes (Signed)
 "  New Patient Office Visit  Subjective   Patient ID: Samantha Reed, female    DOB: September 19, 1970  Age: 54 y.o. MRN: [id number]  CC:  Chief Complaint  Patient presents with   Establish Care    HPI Samantha Reed presents to establish care with new provider.  Patients previous primary care provider: [facility] with Dr. Debby Molt.   Specialist: None   HTN: Chronic. Patient is taking Torsemide  20mg  daily. She reports she will occasionally monitor her BP at home. She has not monitored it recently. She reports the medication causes her to urinate frequently and will sometime not take medication due to having to urinate so frequently. Last seen PCP in April 2025, at that time she was taking Spironolactone  and Torsemide . However, she stopped taking Spironolactone  after the April 2025 visit. Has not took Torsemide  in over a week due to not knowing she had refills. Denies CP, SHOB, HA, dizziness, lightheadedness, or lower extremity edema.  BP Readings from Last 3 Encounters:  07/13/24 (!) 148/86  12/10/23 112/70  10/10/23 134/80    Hyperlipidemia: Chronic. Patient is taking Rosuvastatin  10mg  daily. Denies any increase in muscle pain, joint pain, abdomen, nausea, and vomiting.  Lab Results  Component Value Date   CHOL 179 10/10/2023   HDL 54.50 10/10/2023   LDLCALC 98 10/10/2023   TRIG 131.0 10/10/2023   CHOLHDL 3 10/10/2023    Insomnia: Chronic. Patient was previously on Eszopiclone  3mg  daily. However, she reports she stopped taking medication about 2 months ago when she stopped talking to the doctor.  Last refill was on 02/03/2024 for a 90 day supply.   Outpatient Encounter Medications as of 07/13/2024  Medication Sig   Eszopiclone  3 MG TABS Take 1 tablet (3 mg total) by mouth at bedtime. Take immediately before bedtime   olmesartan  (BENICAR ) 20 MG tablet Take 1 tablet (20 mg total) by mouth daily.   [DISCONTINUED] rosuvastatin  (CRESTOR )  10 MG tablet TAKE 1 TABLET BY MOUTH DAILY   [DISCONTINUED] torsemide  (DEMADEX ) 20 MG tablet TAKE 1 TABLET BY MOUTH DAILY   rosuvastatin  (CRESTOR ) 10 MG tablet Take 1 tablet (10 mg total) by mouth daily.   [DISCONTINUED] empagliflozin  (JARDIANCE ) 10 MG TABS tablet Take 1 tablet (10 mg total) by mouth daily before breakfast. (Patient not taking: Reported on 07/13/2024)   [DISCONTINUED] fluconazole  (DIFLUCAN ) 150 MG tablet Take 1 tablet (150 mg total) by mouth every 3 (three) days.   [DISCONTINUED] VITAMIN D PO Take by mouth. Vitamin d3 (Patient not taking: Reported on 07/13/2024)   No facility-administered encounter medications on file as of 07/13/2024.    Past Medical History:  Diagnosis Date   Anemia    Fibroid    High cholesterol    Hypertension    Sleep apnea    no cpap mild    Past Surgical History:  Procedure Laterality Date   BREAST SURGERY     LUMPECTOMY FROM BREAST ? WHICH BREAST    HYSTERECTOMY ABDOMINAL WITH SALPINGECTOMY Bilateral 04/13/2019   Procedure: HYSTERECTOMY ABDOMINAL WITH SALPINGECTOMY;  Surgeon: Lavoie, Marie-Lyne, MD;  Location: [facility];  Service: Gynecology;  Laterality: Bilateral;  request 7:30am OR Time in [place] [facility] block requests 2 hours OR time   IR RADIOLOGIST EVAL & MGMT  05/12/2019   IR SINUS/FIST TUBE CHK-NON GI  04/30/2019   ORIF ANKLE FRACTURE Right 10/08/2022   Procedure: OPEN REDUCTION INTERNAL FIXATION  RIGHT ANKLE, POSSIBLE SYNDESMOSIS AND OR DELTOID FIXATION POSSIBLE ALLOGRAFT;  Surgeon: Barton Drape, MD;  Location:  SURGERY CENTER;  Service: Orthopedics;  Laterality: Right;  120   TUBAL LIGATION      Family History  Problem Relation Age of Onset   Heart disease Mother    Hypertension Mother    Alcohol abuse Father    Early death Maternal Grandmother    Hearing loss Maternal Grandmother    Hyperlipidemia Maternal Grandmother    Kidney disease Maternal Grandmother    Stroke Maternal Grandmother      Social History   Socioeconomic History   Marital status: Divorced    Spouse name: Not on file   Number of children: 2   Years of education: Not on file   Highest education level: Some college, no degree  Occupational History   Not on file  Tobacco Use   Smoking status: Former    Current packs/day: 0.00    Average packs/day: 1 pack/day for 20.0 years (20.0 ttl pk-yrs)    Types: Cigarettes    Start date: 09/2001    Quit date: 09/2021    Years since quitting: 2.7    Passive exposure: Current   Smokeless tobacco: Never  Vaping Use   Vaping status: Never Used  Substance and Sexual Activity   Alcohol use: Not Currently    Comment: wine 14 a week   Drug use: Yes    Types: Marijuana   Sexual activity: Not Currently    Birth control/protection: Surgical, Abstinence    Comment: intercourse age 81, more than 5v sexual partners  Other Topics Concern   Not on file  Social History Narrative   Not on file   Social Drivers of Health   Tobacco Use: Medium Risk (07/13/2024)   Patient History    Smoking Tobacco Use: Former    Smokeless Tobacco Use: Never    Passive Exposure: Current  Physicist, Medical Strain: Low Risk (07/13/2024)   Overall Financial Resource Strain (CARDIA)    Difficulty of Paying Living Expenses: Not hard at all  Food Insecurity: No Food Insecurity (07/13/2024)   Epic    Worried About Programme Researcher, Broadcasting/film/video in the Last Year: Never true    Ran Out of Food in the Last Year: Never true  Transportation Needs: No Transportation Needs (07/13/2024)   Epic    Lack of Transportation (Medical): No    Lack of Transportation (Non-Medical): No  Physical Activity: Insufficiently Active (07/13/2024)   Exercise Vital Sign    Days of Exercise per Week: 5 days    Minutes of Exercise per Session: 20 min  Stress: No Stress Concern Present (07/13/2024)   [employer] of Occupational Health - Occupational Stress Questionnaire    Feeling of Stress: Not at all  Social  Connections: Moderately Integrated (07/13/2024)   Social Connection and Isolation Panel    Frequency of Communication with Friends and Family: More than three times a week    Frequency of Social Gatherings with Friends and Family: More than three times a week    Attends Religious Services: 1 to 4 times per year    Active Member of [employer] or Organizations: No    Attends Banker Meetings: Never    Marital Status: Married  Catering Manager Violence: Not At Risk (07/13/2024)   Epic    Fear of Current or Ex-Partner: No    Emotionally Abused: No    Physically Abused: No    Sexually Abused: No  Depression (PHQ2-9): Low Risk (07/13/2024)   Depression (PHQ2-9)  PHQ-2 Score: 1  Alcohol Screen: Low Risk (07/13/2024)   Alcohol Screen    Last Alcohol Screening Score (AUDIT): 0  Housing: Low Risk (07/13/2024)   Epic    Unable to Pay for Housing in the Last Year: No    Number of Times Moved in the Last Year: 0    Homeless in the Last Year: No  Utilities: Not At Risk (07/13/2024)   Epic    Threatened with loss of utilities: No  Health Literacy: Adequate Health Literacy (07/13/2024)   B1300 Health Literacy    Frequency of need for help with medical instructions: Never    ROS See HPI above    Objective  BP (!) 148/86   Pulse 74   Temp 98.4 F (36.9 C) (Oral)   Ht 5' 6 (1.676 m)   Wt 162 lb (73.5 kg)   LMP 11/03/2018   SpO2 98%   BMI 26.15 kg/m   Physical Exam Constitutional:      General: She is not in acute distress.    Appearance: Normal appearance. She is overweight. She is not ill-appearing, toxic-appearing or diaphoretic.  HENT:     Head: Normocephalic.  Cardiovascular:     Rate and Rhythm: Normal rate and regular rhythm.     Heart sounds: Normal heart sounds. No murmur heard.    No friction rub. No gallop.  Pulmonary:     Effort: Pulmonary effort is normal. No respiratory distress.     Breath sounds: Normal breath sounds.  Musculoskeletal:     Right lower  leg: No edema.     Left lower leg: No edema.  Skin:    General: Skin is warm and dry.  Neurological:     General: No focal deficit present.     Mental Status: She is alert and oriented to person, place, and time.     Motor: No weakness.     Gait: Gait normal.  Psychiatric:        Mood and Affect: Mood normal.        Behavior: Behavior normal.        Thought Content: Thought content normal.        Judgment: Judgment normal.       Assessment & Plan:  Essential hypertension, benign Assessment & Plan: -Blood pressure is elevated. More likely from not taking her Torsemide . Prescribed Olmesartan  20mg  daily. May take medication either every morning or night. Discussed common side effects. Recommend to monitor blood pressure twice a day and bring into next appointment. Ordered CMP.   Orders: -     Comprehensive metabolic panel with GFR; Future -     Olmesartan  Medoxomil; Take 1 tablet (20 mg total) by mouth daily.  Dispense: 30 tablet; Refill: 0  Stage 3a chronic kidney disease (HCC) Assessment & Plan: Patient was previously prescribed Jardiance  10mg  daily, but stopped taking it. Ordered CMP.   Orders: -     Comprehensive metabolic panel with GFR; Future  Hyperlipidemia LDL goal <130 Assessment & Plan: Continue Rosuvastatin  10mg  daily. Refilled medication. Ordered lipid panel and CMP. Patient is not fasting and will go either go to [facility] or make an appointment here when able to fast.   Orders: -     Comprehensive metabolic panel with GFR; Future -     Lipid panel; Future -     Rosuvastatin  Calcium ; Take 1 tablet (10 mg total) by mouth daily.  Dispense: 90 tablet; Refill: 1  Primary insomnia Assessment & Plan: -Refilled  Eszopiclone  3mg  tablet daily at bedtime for insomnia. Ordered urine drug screen for management of medication. Will need to be collected periodically. Signed controlled substance agreement. Will need to be renewed yearly. Will refill 30 day supply at a  time.   Orders: -     DRUG MONITOR, PANEL 1, SCREEN, URINE -     Eszopiclone ; Take 1 tablet (3 mg total) by mouth at bedtime. Take immediately before bedtime  Dispense: 30 tablet; Refill: 0  Overweight -     CBC with Differential/Platelet; Future -     Hemoglobin A1c; Future -     TSH; Future  History of tobacco abuse -     Pulmonary Visit  Encounter to establish care  1.Review health maintenance: -Declines covid booster, PNA vaccine, Hep B vaccine, zoster, and influenza.  -CT Lung Screening: Refer to pulmonary.  2.Ordered labs based on BMI-overweight.  Return in about 2 weeks (around 07/27/2024) for follow-up.   Labradford Schnitker, NP "

## 2024-07-13 NOTE — Assessment & Plan Note (Signed)
-  Blood pressure is elevated. More likely from not taking her Torsemide . Prescribed Olmesartan  20mg  daily. May take medication either every morning or night. Discussed common side effects. Recommend to monitor blood pressure twice a day and bring into next appointment. Ordered CMP.

## 2024-07-13 NOTE — Patient Instructions (Addendum)
-  It was nice to meet you and look forward to taking care of you. -Blood pressure is elevated. Prescribed Olmesartan  20mg  daily. May take medication either every morning or night. Discussed common side effects. Recommend to monitor blood pressure twice a day and bring into next appointment.  -Refilled Rosuvastatin  for hyperlipidemia.  -Refilled Eszopiclone  3mg  tablet daily at bedtime for insomnia. Ordered urine drug screen for management of medication. Will need to be collected periodically. Signed controlled substance agreement. Will need to be renewed yearly. Will refill 30 day supply at a time.  -Ordered labs. Office will call with lab results and will be available via MyChart. [facility] : Walk in 8:30-4:30 during weekdays, no appointment needed [address]  [place], [place] [address]  -Ordered CT lung screening through pulmonary. Please call the office or send a MyChart message if you do not receive a phone call or a MyChart message about appointment in 2 weeks.  -Follow up in 2 weeks.

## 2024-07-13 NOTE — Assessment & Plan Note (Signed)
 Patient was previously prescribed Jardiance  10mg  daily, but stopped taking it. Ordered CMP.

## 2024-07-14 LAB — DRUG MONITOR, PANEL 1, SCREEN, URINE
Amphetamines: NEGATIVE ng/mL
Barbiturates: NEGATIVE ng/mL
Benzodiazepines: NEGATIVE ng/mL
Cocaine Metabolite: NEGATIVE ng/mL
Creatinine: 98.7 mg/dL
Marijuana Metabolite: POSITIVE ng/mL — AB
Methadone Metabolite: NEGATIVE ng/mL
Opiates: NEGATIVE ng/mL
Oxidant: NEGATIVE ug/mL
Oxycodone: NEGATIVE ng/mL
Phencyclidine: NEGATIVE ng/mL
pH: 6 (ref 4.5–9.0)

## 2024-07-14 LAB — DM TEMPLATE

## 2024-07-15 ENCOUNTER — Ambulatory Visit: Payer: Self-pay | Admitting: Family Medicine

## 2024-07-17 ENCOUNTER — Other Ambulatory Visit

## 2024-07-17 DIAGNOSIS — I10 Essential (primary) hypertension: Secondary | ICD-10-CM | POA: Diagnosis not present

## 2024-07-17 DIAGNOSIS — N1831 Chronic kidney disease, stage 3a: Secondary | ICD-10-CM

## 2024-07-17 DIAGNOSIS — E663 Overweight: Secondary | ICD-10-CM

## 2024-07-17 DIAGNOSIS — E785 Hyperlipidemia, unspecified: Secondary | ICD-10-CM

## 2024-07-17 LAB — LIPID PANEL
Cholesterol: 178 mg/dL (ref 28–200)
HDL: 52.3 mg/dL
LDL Cholesterol: 109 mg/dL — ABNORMAL HIGH (ref 10–99)
NonHDL: 125.38
Total CHOL/HDL Ratio: 3
Triglycerides: 80 mg/dL (ref 10.0–149.0)
VLDL: 16 mg/dL (ref 0.0–40.0)

## 2024-07-17 LAB — COMPREHENSIVE METABOLIC PANEL WITH GFR
ALT: 11 U/L (ref 3–35)
AST: 17 U/L (ref 5–37)
Albumin: 4.3 g/dL (ref 3.5–5.2)
Alkaline Phosphatase: 72 U/L (ref 39–117)
BUN: 12 mg/dL (ref 6–23)
CO2: 28 meq/L (ref 19–32)
Calcium: 9.7 mg/dL (ref 8.4–10.5)
Chloride: 107 meq/L (ref 96–112)
Creatinine, Ser: 1.04 mg/dL (ref 0.40–1.20)
GFR: 60.8 mL/min
Glucose, Bld: 92 mg/dL (ref 70–99)
Potassium: 4.6 meq/L (ref 3.5–5.1)
Sodium: 143 meq/L (ref 135–145)
Total Bilirubin: 0.6 mg/dL (ref 0.2–1.2)
Total Protein: 7.6 g/dL (ref 6.0–8.3)

## 2024-07-17 LAB — CBC WITH DIFFERENTIAL/PLATELET
Basophils Absolute: 0 K/uL (ref 0.0–0.1)
Basophils Relative: 0.9 % (ref 0.0–3.0)
Eosinophils Absolute: 0.1 K/uL (ref 0.0–0.7)
Eosinophils Relative: 2.8 % (ref 0.0–5.0)
HCT: 37.7 % (ref 36.0–46.0)
Hemoglobin: 12.5 g/dL (ref 12.0–15.0)
Lymphocytes Relative: 51.5 % — ABNORMAL HIGH (ref 12.0–46.0)
Lymphs Abs: 2.4 K/uL (ref 0.7–4.0)
MCHC: 33.1 g/dL (ref 30.0–36.0)
MCV: 90.5 fl (ref 78.0–100.0)
Monocytes Absolute: 0.3 K/uL (ref 0.1–1.0)
Monocytes Relative: 7 % (ref 3.0–12.0)
Neutro Abs: 1.8 K/uL (ref 1.4–7.7)
Neutrophils Relative %: 37.8 % — ABNORMAL LOW (ref 43.0–77.0)
Platelets: 268 K/uL (ref 150.0–400.0)
RBC: 4.17 Mil/uL (ref 3.87–5.11)
RDW: 13.6 % (ref 11.5–15.5)
WBC: 4.7 K/uL (ref 4.0–10.5)

## 2024-07-17 LAB — HEMOGLOBIN A1C: Hgb A1c MFr Bld: 5.4 % (ref 4.6–6.5)

## 2024-07-17 LAB — TSH: TSH: 0.75 u[IU]/mL (ref 0.35–5.50)

## 2024-07-31 ENCOUNTER — Ambulatory Visit: Admitting: Family Medicine

## 2024-07-31 ENCOUNTER — Encounter: Payer: Self-pay | Admitting: Family Medicine

## 2024-07-31 VITALS — BP 158/90 | HR 76 | Temp 98.1°F | Ht 66.0 in | Wt 167.0 lb

## 2024-07-31 DIAGNOSIS — I10 Essential (primary) hypertension: Secondary | ICD-10-CM

## 2024-07-31 MED ORDER — OLMESARTAN MEDOXOMIL 40 MG PO TABS: 40.0000 mg | ORAL_TABLET | Freq: Every day | ORAL | 0 refills | Status: AC

## 2024-07-31 NOTE — Assessment & Plan Note (Signed)
 Blood pressure is not at, increase Olmesartan  40 mg daily. Prescription sent into pharmacy. Advised she may take (2) 20mg  tablets that she has already on hand to equal 40mg  daily.  Continue to take blood pressure twice daily and monitor at home. She prefers to not take a diuretic.

## 2024-07-31 NOTE — Progress Notes (Signed)
" ° °  Established Patient Office Visit   Subjective:  Patient ID: Samantha Reed, female    DOB: 13-Dec-1970  Age: 54 y.o. MRN: [id number]  Chief Complaint  Patient presents with   Medical Management of Chronic Issues    1 month follow up    Samantha Reed presents to the clinic for a 2 week follow up for hypertension. Last visit, she was started on Olmesartan  20 mg daily. She takes her medication every morning. Reports that medication is not helping her, and her blood pressures have been high at home. BP today in office was 158/90. At home BP readings include: -1/13: 152/80 -1/15: 131/74, 149/85 -1/18: 116/89, 139/70, 159/82 Denies any CP, lightheadedness, dizziness, N/V, SHOB, lower extremity edema, palpitations. She is going out of the country in a week to [place].  HPI Review of Systems  Constitutional: Negative.   HENT: Negative.    Eyes: Negative.   Respiratory: Negative.    Cardiovascular: Negative.   Gastrointestinal: Negative.   Genitourinary: Negative.   Musculoskeletal: Negative.   Skin: Negative.   Neurological: Negative.   Psychiatric/Behavioral: Negative.      Objective:    BP (!) 158/90   Pulse 76   Temp 98.1 F (36.7 C) (Oral)   Ht 5' 6 (1.676 m)   Wt 167 lb (75.8 kg)   LMP 11/03/2018   SpO2 98%   BMI 26.95 kg/m  BP Readings from Last 3 Encounters:  07/31/24 (!) 158/90  07/13/24 (!) 148/86  12/10/23 112/70   Wt Readings from Last 3 Encounters:  07/31/24 167 lb (75.8 kg)  07/13/24 162 lb (73.5 kg)  10/10/23 155 lb 9.6 oz (70.6 kg)   Physical Exam Constitutional:      Appearance: Normal appearance. She is normal weight.  Cardiovascular:     Rate and Rhythm: Normal rate and regular rhythm.     Heart sounds: Normal heart sounds.  Pulmonary:     Effort: Pulmonary effort is normal.     Breath sounds: Normal breath sounds.  Skin:    General: Skin is warm and dry.  Neurological:     General: No focal deficit present.     Mental Status: She is alert  and oriented to person, place, and time. Mental status is at baseline.  Psychiatric:        Mood and Affect: Mood normal.        Behavior: Behavior normal.        Thought Content: Thought content normal.        Judgment: Judgment normal.   No results found for any visits on 07/31/24.  The 10-year ASCVD risk score (Arnett DK, et al., 2019) is: 9.1%   Assessment & Plan:  Essential hypertension, benign Assessment & Plan: Blood pressure is not at, increase Olmesartan  40 mg daily. Prescription sent into pharmacy. Advised she may take (2) 20mg  tablets that she has already on hand to equal 40mg  daily.  Continue to take blood pressure twice daily and monitor at home. She prefers to not take a diuretic.    Orders: -     Olmesartan  Medoxomil; Take 1 tablet (40 mg total) by mouth daily.  Dispense: 30 tablet; Refill: 0   Health Maintenance: -COVID: Denies. -Pneumococcal: Denies. -Hep B: Denies. -Zoster: Denies. -Influenza: Denies.  -Follow up in 3 weeks.  JoAnna Williamson, NP  "

## 2024-07-31 NOTE — Patient Instructions (Addendum)
 It was nice to see you today. We hope you enjoy your upcoming trip to [place]! Today we: -Discussed management of hypertension and treatment options. -Increase Olmesartan  40 mg daily. Prescription sent into pharmacy. May take (2) 20mg  tablets that you have already on hand to equal 40mg  daily.  -Continue to take blood pressure twice daily and monitor at home. -Follow up in 3 weeks.

## 2024-08-21 ENCOUNTER — Ambulatory Visit: Admitting: Family Medicine
